# Patient Record
Sex: Female | Born: 1935
Health system: Southern US, Community
[De-identification: ages and names within clinical notes are randomized; demographics above are authoritative.]

## PROBLEM LIST (undated history)

## (undated) DIAGNOSIS — K219 Gastro-esophageal reflux disease without esophagitis: Secondary | ICD-10-CM

## (undated) DIAGNOSIS — C50919 Malignant neoplasm of unspecified site of unspecified female breast: Secondary | ICD-10-CM

## (undated) DIAGNOSIS — I499 Cardiac arrhythmia, unspecified: Secondary | ICD-10-CM

## (undated) DIAGNOSIS — I639 Cerebral infarction, unspecified: Secondary | ICD-10-CM

## (undated) DIAGNOSIS — I498 Other specified cardiac arrhythmias: Secondary | ICD-10-CM

## (undated) DIAGNOSIS — R55 Syncope and collapse: Secondary | ICD-10-CM

## (undated) DIAGNOSIS — C50511 Malignant neoplasm of lower-outer quadrant of right female breast: Principal | ICD-10-CM

## (undated) DIAGNOSIS — I10 Essential (primary) hypertension: Secondary | ICD-10-CM

## (undated) DIAGNOSIS — Z923 Personal history of irradiation: Secondary | ICD-10-CM

## (undated) DIAGNOSIS — E039 Hypothyroidism, unspecified: Secondary | ICD-10-CM

## (undated) HISTORY — DX: Malignant neoplasm of lower-outer quadrant of right female breast: C50.511

## (undated) HISTORY — PX: BREAST LUMPECTOMY: SHX2

## (undated) HISTORY — PX: OTHER SURGICAL HISTORY: SHX169

## (undated) HISTORY — PX: ABDOMINAL HYSTERECTOMY: SHX81

## (undated) HISTORY — DX: Malignant neoplasm of unspecified site of unspecified female breast: C50.919

## (undated) HISTORY — PX: CATARACT EXTRACTION: SUR2

## (undated) HISTORY — DX: Syncope and collapse: R55

## (undated) HISTORY — DX: Other specified cardiac arrhythmias: I49.8

## (undated) HISTORY — PX: INCONTINENCE SURGERY: SHX676

## (undated) HISTORY — PX: BLADDER SUSPENSION: SHX72

## (undated) HISTORY — DX: Cardiac arrhythmia, unspecified: I49.9

---

## 1998-02-23 ENCOUNTER — Other Ambulatory Visit: Admission: RE | Admit: 1998-02-23 | Discharge: 1998-02-23 | Payer: Self-pay | Admitting: Obstetrics & Gynecology

## 1999-02-27 ENCOUNTER — Other Ambulatory Visit: Admission: RE | Admit: 1999-02-27 | Discharge: 1999-02-27 | Payer: Self-pay | Admitting: Obstetrics & Gynecology

## 2000-04-21 ENCOUNTER — Other Ambulatory Visit: Admission: RE | Admit: 2000-04-21 | Discharge: 2000-04-21 | Payer: Self-pay | Admitting: Obstetrics & Gynecology

## 2001-03-29 ENCOUNTER — Encounter: Payer: Self-pay | Admitting: Internal Medicine

## 2001-03-29 ENCOUNTER — Encounter: Admission: RE | Admit: 2001-03-29 | Discharge: 2001-03-29 | Payer: Self-pay | Admitting: Internal Medicine

## 2002-06-17 ENCOUNTER — Other Ambulatory Visit: Admission: RE | Admit: 2002-06-17 | Discharge: 2002-06-17 | Payer: Self-pay | Admitting: Obstetrics & Gynecology

## 2003-07-18 ENCOUNTER — Other Ambulatory Visit: Admission: RE | Admit: 2003-07-18 | Discharge: 2003-07-18 | Payer: Self-pay | Admitting: Obstetrics & Gynecology

## 2005-12-10 ENCOUNTER — Other Ambulatory Visit: Admission: RE | Admit: 2005-12-10 | Discharge: 2005-12-10 | Payer: Self-pay | Admitting: Obstetrics & Gynecology

## 2006-07-10 ENCOUNTER — Encounter: Admission: RE | Admit: 2006-07-10 | Discharge: 2006-07-10 | Payer: Self-pay | Admitting: Internal Medicine

## 2006-07-29 ENCOUNTER — Ambulatory Visit (HOSPITAL_COMMUNITY): Admission: RE | Admit: 2006-07-29 | Discharge: 2006-07-29 | Payer: Self-pay | Admitting: Obstetrics & Gynecology

## 2006-07-29 ENCOUNTER — Encounter (INDEPENDENT_AMBULATORY_CARE_PROVIDER_SITE_OTHER): Payer: Self-pay | Admitting: Specialist

## 2007-04-02 ENCOUNTER — Ambulatory Visit: Payer: Self-pay | Admitting: Sports Medicine

## 2007-04-02 DIAGNOSIS — M79609 Pain in unspecified limb: Secondary | ICD-10-CM

## 2007-05-07 ENCOUNTER — Ambulatory Visit: Payer: Self-pay | Admitting: Sports Medicine

## 2008-05-01 ENCOUNTER — Ambulatory Visit (HOSPITAL_BASED_OUTPATIENT_CLINIC_OR_DEPARTMENT_OTHER): Admission: RE | Admit: 2008-05-01 | Discharge: 2008-05-02 | Payer: Self-pay | Admitting: Urology

## 2011-01-07 ENCOUNTER — Other Ambulatory Visit: Payer: Self-pay | Admitting: Dermatology

## 2011-03-11 NOTE — Op Note (Signed)
NAME:  Courtney, Shea NO.:  [id number]   MEDICAL RECORD NO.:  [id number]          PATIENT TYPE:  AMB   LOCATION:  [facility]                         FACILITY:  [facility]   PHYSICIAN:  Valetta Fuller, M.D.  DATE OF BIRTH:  [birth year]   DATE OF PROCEDURE:  05/01/2008  DATE OF DISCHARGE:                               OPERATIVE REPORT   PREOPERATIVE DIAGNOSIS:  Stress urinary incontinence.   POSTOPERATIVE DIAGNOSIS:  Stress urinary incontinence.   PROCEDURE PERFORMED:  Lynx suburethral sling via retropubic approach.   SURGEON:  Valetta Fuller, M.D.   ASSISTANT:  Maudie Flakes.   ANESTHESIA:  General.   INDICATIONS:  Ms. Courtney Shea is a 77 year old female who came to see me  with longstanding and progressive stress incontinence.  She had little  in the way of other voiding dysfunction.  Video urodynamics showed good  functional bladder capacity, with no instability.  She had a low  Valsalva leak point pressure of approximately 60 cmH2O pressure.  We  discussed several options with her, and she elected to have surgical  correction with suburethral sling.  The patient appeared to understand  the advantages and disadvantages, potential complications, and issues  with regard to surgery of this type.  She presents now for the  procedure.   TECHNIQUE AND FINDINGS:  The patient was brought to the operating room.  She had successful induction of general anesthesia and was placed in a  moderate lithotomy position.  She was then prepped and draped in the  usual manner.  A weighted vaginal speculum was utilized, and a Foley  catheter was placed and her bladder completely emptied.  Some Marcaine  was used to infiltrate the vaginal mucosa overlying the midurethra.  A  small incision was then made over the midurethra.  Vaginal planes were  established until were able to palpate the retropubic space bilaterally.  Two small focal incisions were made above the pubic symphysis just  lateral of the midline.  Utilizing direct digital finger control, the  [place] needles were then passed on the right and left side.  Foley  catheter was then removed, and flexible cystoscopy was performed.  The  left needle was seen just entering at the corner of the bladder  laterally.  For that reason, that needle was removed, the bladder was  again decompressed, and it was passed a second time slightly more  laterally.  Repeat cystoscopy then showed the needle to be in excellent  position, with no evidence of any bladder perforation.  The sling was  then brought out of the retropubic incisions and positioned at the  midurethra.  A right-angle clamp was utilized to prevent overtensioning  of the sling.  The protective sleeve was then removed, and copious  irrigation was performed.  Redundant sling was cut at the level of the  pubis bilaterally.  A small little piece of Surgicel was used up in the  left quadrant for what appeared to be some mild venous bleeding that was  inaccessible to the cautery.  After placement of that, the incision was  observed for approximately 5  minutes, and no additional bleeding was  noted.  The vaginal incision was closed with a running 2-0 Vicryl  suture.  Foley  catheter was reinserted, and the bladder was decompressed completely.  Vaginal packing was utilized using Estrace vaginal cream along with some  bacitracin.  The patient appeared to tolerate the procedure well.  There  were no obvious complications or problems.  She was brought to the  recovery room in stable condition.           ______________________________  Valetta Fuller, M.D.  Electronically Signed     DSG/MEDQ  D:  05/01/2008  T:  05/01/2008  Job:  [id number]

## 2011-03-14 NOTE — Op Note (Signed)
NAME:  Courtney Shea, Courtney Shea NO.:  [id number]   MEDICAL RECORD NO.:  [id number]          PATIENT TYPE:  AMB   LOCATION:  [facility]                           FACILITY:  [facility]   PHYSICIAN:  Freddy Finner, M.D.   DATE OF BIRTH:  03-01-1930   DATE OF PROCEDURE:  DATE OF DISCHARGE:                                 OPERATIVE REPORT   PREOPERATIVE DIAGNOSIS:  Complex right ovarian mass.   POSTOPERATIVE DIAGNOSIS:  Benign ovarian cyst of the right ovary.   FINAL PATHOLOGY:  Pending.   OPERATIVE PROCEDURE:  Bilateral salpingo-oophorectomy.   SURGEON:  Dr. Jennette Kettle.   ANESTHESIA:  General.   INTRAOPERATIVE COMPLICATIONS:  None.   ESTIMATED INTRAOPERATIVE BLOOD LOSS:  10 mL or less.   The patient is a 77 year old who had an incidental finding of an ovarian  mass on CT scan done with her internist, Dr. Kevan Ny, for generalized  abdominal pain.  This was evaluated in the office with ultrasound which  confirmed the presence of a complex mass.  The CT scan was otherwise  unremarkable except for diverticulosis.  A CA125 was normal.  Given all of  these findings, it was felt that the mass most likely is benign and has  elected to proceed with laparoscopic bilateral salpingo-oophorectomy.  The  patient was admitted on the morning of surgery.  She was brought to the  Operating Room and placed under adequate general anesthesia, placed in the  dorsal lithotomy position using the [facility] stirrup system.  Abdomen was  prepped in the usual fashion.  Bladder was evacuated with a Robinson  catheter with sterile technique.  Sponge forceps with a sponge in the tip  was placed into the vagina for potential use during the procedure for  exposure.  Sterile drapes were applied.  A small incision was noted at the  umbilicus and through it an 11 mm disposable trocar was introduced while  elevating the anterior abdominal wall manually.  Direct inspection revealed  adequate placement with no evidence of injury  on entry.  The  pneumoperitoneum was allowed accumulate with carbon dioxide gas.  A small  incision was made approximately 2-3 cm above the symphysis in the midline  and through it a 5 mm trocar was initially introduced under direct  visualization.  A blunt probe was used for traction and exposure.  Examination of the abdomen and pelvis was carried out.  There was no  evidence of a peritoneal disease or omental disease.  The liver appeared to  be normal.  The appendix could not be visualized.  Both tubes and ovaries  were carefully visualized and had an identical appearance, although the  right was a little bit larger.  There was no obvious excrescences or other  indications of malignancy based on visual inspection.  Using the __________  device, infundibulum pelvic ligament on the right was progressively sealed  and divided to completely free the right tube and ovary.  The left was  treated similarly in a virtually identical fashion.  Hemostasis at this  point was complete.  The 5 mm trocar was removed,  and a 11 mm trocar  introduced through the lower incision which was somewhat enlarged.  The  fascia was dilated with a Kelly clamp.  Endobag was then placed into the  abdominal cavity through the lower trocar, and the ovary captured and  removed.  The left was treated identically.  The right ovary was sent for  frozen section and initial examination by Dr. Luisa Hart confirmed benign  change.  Please note that peritoneal washings were taken on entry into the  abdomen.  With complete hemostasis confirmed under reduced intraabdominal  pressure, the gas was allowed to escape from the abdomen.  The instruments  were then removed.  The umbilical incision was closed with interrupted  subcuticular sutures of 3-0 Dexon.  The lower incision was closed in 2  layers because of the larger fascial opening and 0 Vicryl suture was used to  close the fascia.  Skin incision was closed with interrupted  subcuticular  sutures of 3-0 Dexon and quarter-inch Steri-Strips.  The patient was given  Toradol 30 mg IV on completion of the procedure.  She was taken to the  Recovery Room in good condition.  She will be discharged postoperatively  with routine outpatient surgical instructions.  She has Darvocet to be taken  as needed for postoperative pain.  She is to return to the office in  approximately 2 weeks for postoperative followup.      Freddy Finner, M.D.  Electronically Signed     WRN/MEDQ  D:  07/29/2006  T:  07/29/2006  Job:  [id number]   cc:   Candyce Churn, M.D.  Fax: [phone]

## 2011-03-14 NOTE — H&P (Signed)
NAME:  Courtney Shea, Courtney Shea NO.:  [id number]   MEDICAL RECORD NO.:  [id number]          PATIENT TYPE:  AMB   LOCATION:  [facility]                           FACILITY:  [facility]   PHYSICIAN:  Freddy Finner, M.D.   DATE OF BIRTH:  [birth year]   DATE OF ADMISSION:  DATE OF DISCHARGE:                                HISTORY & PHYSICAL   ADMISSION DIAGNOSIS:  Complex right adnexal mass consistent with complex  ovarian mass.   The patient is a 77 year old white married female, gravida 2, para 2, who  had a recent CT scan for generalized abdominal pain with her internist, Dr.  Kevan Ny.  Findings shows diverticulosis with no evidence of diverticulitis  associated and showed a 3 x 2 cm right adnexal lesion consistent with an  ovarian mass.  Dr. Kevan Ny kindly referred her to me for further management of  this problem.  He did obtain a CA125 in his office which was normal.  In my  office after initial examination, a pelvic ultrasound was obtained.  Left  ovary was felt to be normal for age.  The right ovary was 2.5 x 1.5 x 1.8 cm  and was complex.  There was some difficulty in visualizing ovarian tissue.  Minimal vascularity was seen in the mass.  There was no free fluid in the  cul-de-sac.  Given this and the essentially normal CT findings except as  noted above and some benign cysts in the liver and kidney, it is felt that  this is more than likely a benign process and for that reason the initial  attempt will be to approach this laparoscopically and perform a bilateral  salpingo-oophorectomy.  The possibility of laparotomy and the presence of  malignant disease has been discussed with the patient and she is prepared to  proceed with these procedures.  She is admitted at this time for that  purpose.   REVIEW OF SYSTEMS:  Essentially otherwise negative except as noted in  present illness.   PAST MEDICAL HISTORY:  The patient is known to have hypothyroidism and  hypertension for which she is  followed by Dr. Kevan Ny.  She is currently on 75  mcg of Synthroid, Lotrel 5/10 for hypertension.  She is on Premarin 0.45 mg  a day and baby aspirin daily.  She has no other known significant medical  illnesses other than esophageal reflux.  She has had a past history of  intermittent anemia.  She has no known allergies to medications.  Essentially all of her medications are noted above.  She is not a cigarette  smoker nor does she consume alcohol.   FAMILY HISTORY:  Mother and father both died of strokes.  No other  significant family history is noted.   PHYSICAL EXAMINATION:  HEENT:  Grossly within normal limits.  VITAL SIGNS:  Blood pressure in the office was 142/70, weight 149.2.  NECK:  Thyroid gland is not palpably enlarged to my examination.  LUNGS:  Clear to auscultation throughout.  HEART:  Normal sinus rhythm without murmurs, rubs, or gallops.  BREASTS:  Normal with no palpable  masses, no nipple discharge, no skin  change (most recent mammogram was performed in February of 2007 and was  considered to be normal).  ABDOMEN:  Soft and nontender.  There is no appreciable organomegaly or  palpable masses.  There is no CVA tenderness.  PELVIC:  External genitalia, vagina, and cervix are normal to inspection.  There is mild vaginal atrophy.  There is a first degree cystocele and first  degree rectocele.  Bimanual reveals no palpable adnexal masses and  rectovaginal examination confirms these findings.  There is no palpable  rectal abnormality.  EXTREMITIES:  Without cyanosis, clubbing, or edema.   ASSESSMENT:  Recently diagnosed incidental finding of complex right adnexal  mass consistent with an ovarian neoplasm probably benign.  Medical history  of hypothyroidism and hypertension, well managed on medications at the  direction of Dr. Kevan Ny.   PLAN:  Laparoscopy, probably bilateral salpingo-oophorectomy by this  procedure to be followed with laparotomy as needed.      Freddy Finner, M.D.  Electronically Signed     WRN/MEDQ  D:  07/28/2006  T:  07/28/2006  Job:  [id number]   cc:   Candyce Churn, M.D.  Fax: [phone]

## 2011-07-24 LAB — POCT I-STAT 4, (NA,K, GLUC, HGB,HCT)
Glucose, Bld: 97
Potassium: 4

## 2011-11-04 DIAGNOSIS — R269 Unspecified abnormalities of gait and mobility: Secondary | ICD-10-CM | POA: Diagnosis not present

## 2011-11-04 DIAGNOSIS — Z79899 Other long term (current) drug therapy: Secondary | ICD-10-CM | POA: Diagnosis not present

## 2011-11-04 DIAGNOSIS — I10 Essential (primary) hypertension: Secondary | ICD-10-CM | POA: Diagnosis not present

## 2011-11-04 DIAGNOSIS — E559 Vitamin D deficiency, unspecified: Secondary | ICD-10-CM | POA: Diagnosis not present

## 2011-11-04 DIAGNOSIS — R609 Edema, unspecified: Secondary | ICD-10-CM | POA: Diagnosis not present

## 2011-11-04 DIAGNOSIS — L408 Other psoriasis: Secondary | ICD-10-CM | POA: Diagnosis not present

## 2011-11-04 DIAGNOSIS — E039 Hypothyroidism, unspecified: Secondary | ICD-10-CM | POA: Diagnosis not present

## 2011-12-04 ENCOUNTER — Inpatient Hospital Stay (HOSPITAL_COMMUNITY)
Admission: EM | Admit: 2011-12-04 | Discharge: 2011-12-05 | DRG: 690 | Disposition: A | Discharge status: Home / Self Care | Payer: Medicare Other | Attending: Internal Medicine | Admitting: Internal Medicine

## 2011-12-04 ENCOUNTER — Encounter (HOSPITAL_COMMUNITY): Payer: Self-pay | Admitting: Cardiology

## 2011-12-04 ENCOUNTER — Emergency Department (HOSPITAL_COMMUNITY): Payer: Medicare Other

## 2011-12-04 ENCOUNTER — Other Ambulatory Visit: Payer: Self-pay

## 2011-12-04 DIAGNOSIS — N390 Urinary tract infection, site not specified: Principal | ICD-10-CM | POA: Diagnosis present

## 2011-12-04 DIAGNOSIS — E876 Hypokalemia: Secondary | ICD-10-CM | POA: Diagnosis present

## 2011-12-04 DIAGNOSIS — I498 Other specified cardiac arrhythmias: Secondary | ICD-10-CM | POA: Diagnosis not present

## 2011-12-04 DIAGNOSIS — D72829 Elevated white blood cell count, unspecified: Secondary | ICD-10-CM | POA: Diagnosis present

## 2011-12-04 DIAGNOSIS — E119 Type 2 diabetes mellitus without complications: Secondary | ICD-10-CM | POA: Diagnosis not present

## 2011-12-04 DIAGNOSIS — R55 Syncope and collapse: Secondary | ICD-10-CM | POA: Diagnosis present

## 2011-12-04 DIAGNOSIS — E039 Hypothyroidism, unspecified: Secondary | ICD-10-CM | POA: Diagnosis present

## 2011-12-04 DIAGNOSIS — I10 Essential (primary) hypertension: Secondary | ICD-10-CM | POA: Diagnosis not present

## 2011-12-04 DIAGNOSIS — Z7982 Long term (current) use of aspirin: Secondary | ICD-10-CM

## 2011-12-04 DIAGNOSIS — I471 Supraventricular tachycardia: Secondary | ICD-10-CM | POA: Diagnosis not present

## 2011-12-04 DIAGNOSIS — R4182 Altered mental status, unspecified: Secondary | ICD-10-CM | POA: Diagnosis not present

## 2011-12-04 DIAGNOSIS — S0190XA Unspecified open wound of unspecified part of head, initial encounter: Secondary | ICD-10-CM | POA: Diagnosis present

## 2011-12-04 DIAGNOSIS — I369 Nonrheumatic tricuspid valve disorder, unspecified: Secondary | ICD-10-CM

## 2011-12-04 DIAGNOSIS — R404 Transient alteration of awareness: Secondary | ICD-10-CM | POA: Diagnosis not present

## 2011-12-04 DIAGNOSIS — Z9181 History of falling: Secondary | ICD-10-CM | POA: Diagnosis not present

## 2011-12-04 DIAGNOSIS — E032 Hypothyroidism due to medicaments and other exogenous substances: Secondary | ICD-10-CM | POA: Diagnosis not present

## 2011-12-04 DIAGNOSIS — S0100XA Unspecified open wound of scalp, initial encounter: Secondary | ICD-10-CM | POA: Diagnosis not present

## 2011-12-04 DIAGNOSIS — W1809XA Striking against other object with subsequent fall, initial encounter: Secondary | ICD-10-CM | POA: Diagnosis present

## 2011-12-04 DIAGNOSIS — IMO0002 Reserved for concepts with insufficient information to code with codable children: Secondary | ICD-10-CM

## 2011-12-04 HISTORY — DX: Gastro-esophageal reflux disease without esophagitis: K21.9

## 2011-12-04 HISTORY — DX: Hypothyroidism, unspecified: E03.9

## 2011-12-04 HISTORY — DX: Essential (primary) hypertension: I10

## 2011-12-04 LAB — CARDIAC PANEL(CRET KIN+CKTOT+MB+TROPI)
CK, MB: 5.7 ng/mL — ABNORMAL HIGH (ref 0.3–4.0)
Relative Index: 0.8 (ref 0.0–2.5)
Troponin I: <0.3 ng/mL (ref <0.30)

## 2011-12-04 LAB — COMPREHENSIVE METABOLIC PANEL
ALT: 19 U/L (ref 0–35)
AST: 21 U/L (ref 0–37)
Albumin: 3.8 g/dL (ref 3.5–5.2)
BUN: 18 mg/dL (ref 6–23)
CO2: 26 mEq/L (ref 19–32)
CO2: 24 mEq/L (ref 19–32)
Calcium: 9.6 mg/dL (ref 8.4–10.5)
Calcium: 9.1 mg/dL (ref 8.4–10.5)
Creatinine, Ser: 0.66 mg/dL (ref 0.50–1.10)
GFR calc Af Amer: >90 mL/min (ref >90)
GFR calc non Af Amer: 82 mL/min — ABNORMAL LOW (ref >90)
Glucose, Bld: 143 mg/dL — ABNORMAL HIGH (ref 70–99)
Sodium: 138 mEq/L (ref 135–145)
Sodium: 136 mEq/L (ref 135–145)
Total Protein: 7.4 g/dL (ref 6.0–8.3)
Total Protein: 6.8 g/dL (ref 6.0–8.3)

## 2011-12-04 LAB — URINALYSIS, ROUTINE W REFLEX MICROSCOPIC
Glucose, UA: NEGATIVE mg/dL
Ketones, ur: NEGATIVE mg/dL
Leukocytes, UA: NEGATIVE
Nitrite: NEGATIVE
Protein, ur: NEGATIVE mg/dL
Urobilinogen, UA: 0.2 mg/dL (ref 0.0–1.0)

## 2011-12-04 LAB — DIFFERENTIAL
Basophils Absolute: 0 10*3/uL (ref 0.0–0.1)
Basophils Relative: 0 % (ref 0–1)
Eosinophils Absolute: 0 10*3/uL (ref 0.0–0.7)
Eosinophils Relative: 0 % (ref 0–5)
Lymphocytes Relative: 2 % — ABNORMAL LOW (ref 12–46)
Lymphocytes Relative: 4 % — ABNORMAL LOW (ref 12–46)
Lymphs Abs: 0.7 10*3/uL (ref 0.7–4.0)
Monocytes Relative: 5 % (ref 3–12)
Neutrophils Relative %: 91 % — ABNORMAL HIGH (ref 43–77)

## 2011-12-04 LAB — CBC
MCHC: 33.9 g/dL (ref 30.0–36.0)
MCV: 91.5 fL (ref 78.0–100.0)
MCV: 91.1 fL (ref 78.0–100.0)
Platelets: 196 10*3/uL (ref 150–400)
Platelets: 196 10*3/uL (ref 150–400)
RBC: 4.27 MIL/uL (ref 3.87–5.11)
RDW: 12.5 % (ref 11.5–15.5)
RDW: 12.6 % (ref 11.5–15.5)
WBC: 15.8 10*3/uL — ABNORMAL HIGH (ref 4.0–10.5)
WBC: 17.1 10*3/uL — ABNORMAL HIGH (ref 4.0–10.5)

## 2011-12-04 LAB — APTT: aPTT: 28 seconds (ref 24–37)

## 2011-12-04 LAB — MAGNESIUM: Magnesium: 1.6 mg/dL (ref 1.5–2.5)

## 2011-12-04 LAB — PROTIME-INR: Prothrombin Time: 14.3 seconds (ref 11.6–15.2)

## 2011-12-04 LAB — URINE MICROSCOPIC-ADD ON

## 2011-12-04 LAB — POCT I-STAT TROPONIN I

## 2011-12-04 LAB — PHOSPHORUS: Phosphorus: 2.8 mg/dL (ref 2.3–4.6)

## 2011-12-04 LAB — HEMOGLOBIN A1C
Hgb A1c MFr Bld: 5.6 % (ref <5.7)
Mean Plasma Glucose: 114 mg/dL (ref <117)

## 2011-12-04 LAB — TSH: TSH: 2.642 u[IU]/mL (ref 0.350–4.500)

## 2011-12-04 MED ORDER — FUROSEMIDE 20 MG PO TABS
20.0000 mg | ORAL_TABLET | Freq: Every day | ORAL | Status: DC
  Administered 2011-12-04: 20 mg via ORAL
  Filled 2011-12-04 (×2): qty 1

## 2011-12-04 MED ORDER — SODIUM CHLORIDE 0.9 % IJ SOLN
3.0000 mL | Freq: Two times a day (BID) | INTRAMUSCULAR | Status: DC
  Administered 2011-12-04: 3 mL via INTRAVENOUS

## 2011-12-04 MED ORDER — LOSARTAN POTASSIUM 50 MG PO TABS
100.0000 mg | ORAL_TABLET | Freq: Every day | ORAL | Status: DC
  Filled 2011-12-04 (×2): qty 2

## 2011-12-04 MED ORDER — CIPROFLOXACIN IN D5W 400 MG/200ML IV SOLN
400.0000 mg | INTRAVENOUS | Status: DC
  Administered 2011-12-04: 400 mg via INTRAVENOUS
  Filled 2011-12-04: qty 200

## 2011-12-04 MED ORDER — ONDANSETRON HCL 4 MG/2ML IJ SOLN: 4.0000 mg | Freq: Four times a day (QID) | INTRAMUSCULAR | Status: DC | PRN

## 2011-12-04 MED ORDER — ESTROGENS CONJUGATED 0.45 MG PO TABS
0.4500 mg | ORAL_TABLET | Freq: Every day | ORAL | Status: DC
  Filled 2011-12-04: qty 1

## 2011-12-04 MED ORDER — LEVOTHYROXINE SODIUM 88 MCG PO TABS
88.0000 ug | ORAL_TABLET | Freq: Every day | ORAL | Status: DC
  Filled 2011-12-04 (×2): qty 1

## 2011-12-04 MED ORDER — SENNOSIDES-DOCUSATE SODIUM 8.6-50 MG PO TABS
1.0000 | ORAL_TABLET | Freq: Every day | ORAL | Status: DC | PRN
Start: 2011-12-04 — End: 2011-12-05
  Filled 2011-12-04: qty 1

## 2011-12-04 MED ORDER — ONDANSETRON HCL 4 MG PO TABS: 4.0000 mg | ORAL_TABLET | Freq: Four times a day (QID) | ORAL | Status: DC | PRN

## 2011-12-04 MED ORDER — ACETAMINOPHEN 325 MG PO TABS
650.0000 mg | ORAL_TABLET | Freq: Four times a day (QID) | ORAL | Status: DC | PRN
  Administered 2011-12-04: 650 mg via ORAL
  Filled 2011-12-04: qty 2

## 2011-12-04 MED ORDER — ACETAMINOPHEN 500 MG PO TABS
1000.0000 mg | ORAL_TABLET | ORAL | Status: AC
  Administered 2011-12-04: 1000 mg via ORAL
  Filled 2011-12-04: qty 2

## 2011-12-04 MED ORDER — SODIUM CHLORIDE 0.9 % IV SOLN
INTRAVENOUS | Status: DC
  Administered 2011-12-04: 16:00:00 via INTRAVENOUS

## 2011-12-04 MED ORDER — POTASSIUM CHLORIDE CRYS ER 20 MEQ PO TBCR
20.0000 meq | EXTENDED_RELEASE_TABLET | Freq: Once | ORAL | Status: AC
  Administered 2011-12-04: 20 meq via ORAL
  Filled 2011-12-04: qty 1

## 2011-12-04 MED ORDER — DEXTROSE 5 % IV SOLN
1.0000 g | INTRAVENOUS | Status: DC
  Administered 2011-12-04: 1 g via INTRAVENOUS
  Filled 2011-12-04 (×2): qty 10

## 2011-12-04 NOTE — Progress Notes (Signed)
Pt admitted and receiving IV Cipro from ED. Pt arm red and itching where IV infusing. Infusion stopped, MD notified. Will continue to monitor site. Courtney Shea

## 2011-12-04 NOTE — ED Notes (Signed)
MD at bedside. 

## 2011-12-04 NOTE — Progress Notes (Signed)
  Echocardiogram 2D Echocardiogram has been performed.  Courtney Shea 12/04/2011, 3:39 PM

## 2011-12-04 NOTE — H&P (Signed)
PCP:  No primary provider on file.   DOA:  12/04/2011  9:25 AM  Chief Complaint:  syncope  HPI:  78 year old female with history of HTN, hypothyroidism, on estrogen supplement who presented to ED after a syncopal episode on the day of admission. Patient reported she found herself on the floor but is unsure of how long she was on the floor and could not recall the events prior to loss of consciousness. Patient denied prodromal symptoms of chest pain, palpitations, shortness of breath. No lightheadedness or dizziness prior to syncope. Patient has never experienced similar symptoms in past. Patient reports having dysuria about few days prior to admission. No associated hematuria, abdominal pain, nausea or vomiting. No complaints of fever, chills.   Assessment/Plan  Principal Problem:   *Syncope - unclear etiology of syncopal episode/ perhaps secondary to cardiac arrythmia versus an infectious process such as UTI - CT head shows no acute intracranial findings - unsure why patient is on estrogen supplement so I will hold it for today and perhaps it can be evaluated if she truly needs this supplement - cycle cardiac enzymes, TSH, fasting lipid panel, A1c - follow up 2 D ECHO  Active Problems:  UTI (lower urinary tract infection) - patient had an allergic reaction to cipro (broke out / rash) so we will start ceftriaxone - follow up urine culture  Hypothyroidism - continue levothyroxine - check TSH  HTN (hypertension) - BP 138/63; at goal - continue home medications   Hypokalemia - repleted - follow up BMP in am  Disposition - admit to telemetry - PCP notified of patient's admission   Allergies: No Known Allergies  Prior to Admission medications   Medication Sig Start Date End Date Taking? Authorizing Provider  aspirin 81 MG tablet Take 81 mg by mouth daily.   Yes Historical Provider, MD  estrogens, conjugated, (PREMARIN) 0.45 MG tablet Take 0.45 mg by mouth daily.   Yes  Historical Provider, MD  furosemide (LASIX) 20 MG tablet Take 20 mg by mouth daily as needed. swelling   Yes Historical Provider, MD  levothyroxine (SYNTHROID, LEVOTHROID) 88 MCG tablet Take 88 mcg by mouth daily.   Yes Historical Provider, MD  losartan (COZAAR) 100 MG tablet Take 100 mg by mouth daily.   Yes Historical Provider, MD  sennosides-docusate sodium (SENOKOT-S) 8.6-50 MG tablet Take 1 tablet by mouth daily as needed.   Yes Historical Provider, MD    Past Medical History  Diagnosis Date  . Hypertension   . Hypothyroid   . Shortness of breath   . GERD (gastroesophageal reflux disease)     Past Surgical History  Procedure Date  . Abdominal hysterectomy   . Ovarian cyst removed   . Incontinence surgery     Social History:  reports that she has never smoked. She has never used smokeless tobacco. She reports that she does not drink alcohol or use illicit drugs.  Family History  Problem Relation Age of Onset  . Coronary artery disease Mother     MI at age 44    Review of Systems:  Constitutional: Denies fever, chills, diaphoresis, appetite change and fatigue.  HEENT: Denies photophobia, eye pain, redness, hearing loss, ear pain, congestion, sore throat, rhinorrhea, sneezing, mouth sores, trouble swallowing, neck pain, neck stiffness and tinnitus.   Respiratory: Denies SOB, DOE, cough, chest tightness,  and wheezing.   Cardiovascular: Denies chest pain, palpitations and leg swelling.  Gastrointestinal: Denies nausea, vomiting, abdominal pain, diarrhea, constipation, blood in stool  and abdominal distention.  Genitourinary: Denies dysuria, urgency, frequency, hematuria, flank pain and difficulty urinating.  Musculoskeletal: Denies myalgias, back pain, joint swelling, arthralgias and gait problem.  Skin: Denies pallor, rash and wound.  Neurological: Denies dizziness, seizures, weakness, light-headedness, numbness and headaches. ; positive for syncope Hematological: Denies  adenopathy. Easy bruising, personal or family bleeding history  Psychiatric/Behavioral: Denies suicidal ideation, mood changes, confusion, nervousness, sleep disturbance and agitation   Physical Exam:  Filed Vitals:   12/04/11 1134 12/04/11 1300 12/04/11 1413 12/04/11 1500  BP:  140/62 136/52 138/63  Pulse: 113 114 110 58  Temp:    98.7 F (37.1 C)  TempSrc:    Oral  Resp: 20 20 18 18   Height:      Weight:      SpO2: 95% 95% 93% 92%    Constitutional: Vital signs reviewed.  Patient is a well-developed and well-nourished in no acute distress and cooperative with exam. Alert and oriented x3.  Head: Normocephalic and atraumatic Ear: TM normal bilaterally Mouth: no erythema or exudates, MMM Eyes: PERRL, EOMI, conjunctivae normal, No scleral icterus.  Neck: Supple, Trachea midline normal ROM, No JVD, mass, thyromegaly, or carotid bruit present.  Cardiovascular: RRR, S1 normal, S2 normal, no MRG, pulses symmetric and intact bilaterally Pulmonary/Chest: CTAB, no wheezes, rales, or rhonchi Abdominal: Soft. Non-tender, non-distended, bowel sounds are normal, no masses, organomegaly, or guarding present.  GU: no CVA tenderness Musculoskeletal: No joint deformities, erythema, or stiffness, ROM full and no nontender Ext: no edema and no cyanosis, pulses palpable bilaterally (DP and PT) Hematology: no cervical, inginal, or axillary adenopathy.  Neurological: A&O x3, Strenght is normal and symmetric bilaterally, cranial nerve II-XII are grossly intact, no focal motor deficit, sensory intact to light touch bilaterally.  Skin: Warm, dry and intact. No rash, cyanosis, or clubbing.  Psychiatric: Normal mood and affect. speech and behavior is normal. Judgment and thought content normal. Cognition and memory are normal.   Labs on Admission:  Results for orders placed during the hospital encounter of 12/04/11 (from the past 48 hour(s))  CBC     Status: Abnormal   Collection Time   12/04/11 10:13 AM        Component Value Range Comment   WBC 15.8 (*) 4.0 - 10.5 (K/uL)    RBC 4.58  3.87 - 5.11 (MIL/uL)    Hemoglobin 14.2  12.0 - 15.0 (g/dL)    HCT 45.4  09.8 - 11.9 (%)    MCV 91.5  78.0 - 100.0 (fL)    MCH 31.0  26.0 - 34.0 (pg)    MCHC 33.9  30.0 - 36.0 (g/dL)    RDW 14.7  82.9 - 56.2 (%)    Platelets 196  150 - 400 (K/uL)   DIFFERENTIAL     Status: Abnormal   Collection Time   12/04/11 10:13 AM      Component Value Range Comment   Neutrophils Relative 92 (*) 43 - 77 (%)    Neutro Abs 14.6 (*) 1.7 - 7.7 (K/uL)    Lymphocytes Relative 2 (*) 12 - 46 (%)    Lymphs Abs 0.4 (*) 0.7 - 4.0 (K/uL)    Monocytes Relative 5  3 - 12 (%)    Monocytes Absolute 0.8  0.1 - 1.0 (K/uL)    Eosinophils Relative 0  0 - 5 (%)    Eosinophils Absolute 0.0  0.0 - 0.7 (K/uL)    Basophils Relative 0  0 - 1 (%)    Basophils  Absolute 0.0  0.0 - 0.1 (K/uL)   COMPREHENSIVE METABOLIC PANEL     Status: Abnormal   Collection Time   12/04/11 10:13 AM      Component Value Range Comment   Sodium 138  135 - 145 (mEq/L)    Potassium 3.4 (*) 3.5 - 5.1 (mEq/L)    Chloride 99  96 - 112 (mEq/L)    CO2 26  19 - 32 (mEq/L)    Glucose, Bld 128 (*) 70 - 99 (mg/dL)    BUN 17  6 - 23 (mg/dL)    Creatinine, Ser 6.21  0.50 - 1.10 (mg/dL)    Calcium 9.6  8.4 - 10.5 (mg/dL)    Total Protein 7.4  6.0 - 8.3 (g/dL)    Albumin 3.8  3.5 - 5.2 (g/dL)    AST 21  0 - 37 (U/L)    ALT 19  0 - 35 (U/L)    Alkaline Phosphatase 69  39 - 117 (U/L)    Total Bilirubin 0.6  0.3 - 1.2 (mg/dL)    GFR calc non Af Amer 80 (*) >90 (mL/min)    GFR calc Af Amer >90  >90 (mL/min)   POCT I-STAT TROPONIN I     Status: Normal   Collection Time   12/04/11 10:22 AM      Component Value Range Comment   Troponin i, poc 0.00  0.00 - 0.08 (ng/mL)    Comment 3            URINALYSIS, ROUTINE W REFLEX MICROSCOPIC     Status: Abnormal      Component Value Range Comment    Color, Urine YELLOW  YELLOW     APPearance CLEAR  CLEAR     Specific Gravity, Urine  1.012  1.005 - 1.030     pH 7.5  5.0 - 8.0     Glucose, UA NEGATIVE  NEGATIVE (mg/dL)    Hgb urine dipstick TRACE (*) NEGATIVE     Bilirubin Urine NEGATIVE  NEGATIVE     Ketones, ur NEGATIVE  NEGATIVE (mg/dL)    Protein, ur NEGATIVE  NEGATIVE (mg/dL)    Urobilinogen, UA 0.2  0.0 - 1.0 (mg/dL)    Nitrite NEGATIVE  NEGATIVE     Leukocytes, UA NEGATIVE  NEGATIVE    URINE MICROSCOPIC-ADD ON     Status: Abnormal   Collection Time   12/04/11  2:07 PM      Component Value Range Comment   Squamous Epithelial / LPF FEW (*) RARE     RBC / HPF 0-2  <3 (RBC/hpf)    Bacteria, UA RARE  RARE    COMPREHENSIVE METABOLIC PANEL     Status: Abnormal      Component Value Range Comment   Sodium 136  135 - 145 (mEq/L)    Potassium 3.7  3.5 - 5.1 (mEq/L)    Chloride 99  96 - 112 (mEq/L)    CO2 24  19 - 32 (mEq/L)    Glucose, Bld 143 (*) 70 - 99 (mg/dL)    BUN 18  6 - 23 (mg/dL)    Creatinine, Ser 3.08  0.50 - 1.10 (mg/dL)    Calcium 9.1  8.4 - 10.5 (mg/dL)    Total Protein 6.8  6.0 - 8.3 (g/dL)    Albumin 3.3 (*) 3.5 - 5.2 (g/dL)    AST 26  0 - 37 (U/L)    ALT 20  0 - 35 (U/L)    Alkaline Phosphatase  64  39 - 117 (U/L)    Total Bilirubin 0.6  0.3 - 1.2 (mg/dL)    GFR calc non Af Amer 82 (*) >90 (mL/min)    GFR calc Af Amer >90  >90 (mL/min)   MAGNESIUM     Status: Normal   Magnesium 1.6  1.5 - 2.5 (mg/dL)   PHOSPHORUS     Status: Normal      Component Value Range Comment   Phosphorus 2.8  2.3 - 4.6 (mg/dL)    WBC 40.9 (*) 4.0 - 10.5 (K/uL)    RBC 4.27  3.87 - 5.11 (MIL/uL)    Hemoglobin 13.3  12.0 - 15.0 (g/dL)    HCT 81.1  91.4 - 78.2 (%)    MCV 91.1  78.0 - 100.0 (fL)    MCH 31.1  26.0 - 34.0 (pg)    MCHC 34.2  30.0 - 36.0 (g/dL)    RDW 95.6  21.3 - 08.6 (%)    Platelets 196  150 - 400 (K/uL)    aPTT 28  24 - 37 (seconds)    Prothrombin Time 14.3  11.6 - 15.2 (seconds)    INR 1.09  0.00 - 1.49     Total CK PENDING  7 - 177 (U/L)    CK, MB 5.8 (*) 0.3 - 4.0 (ng/mL)    Troponin I <0.30   <0.30 (ng/mL)    Relative Index PENDING  0.0 - 2.5      Radiological Exams on Admission: No results found.  Time Spent on Admission: Greater than 30 minutes  Mylisa Brunson 12/04/2011, 4:43 PM

## 2011-12-04 NOTE — ED Notes (Signed)
Pt to department from home via EMS- pt husband found the pt on the floor and pt reports that she was unable to remember events. Pt has an inch lac to the back of her head from the fall. Pt had another episode with husband PTA. Bp-165/75 Hr-110 in [place].

## 2011-12-04 NOTE — Consults (Signed)
HPI: 78 year old female with no prior cardiac history for evaluation of question syncope. Patient occasionally has mild dyspnea on exertion but no orthopnea, PND, palpitations or history of syncope. Occasional mild pedal edema in the summer controlled with diuretics. Occasional chest pain for several years. Pain is substernal without radiation. It lasts several minutes and resolves spontaneously. Not pleuritic, positional, exertional or related to food. No associated symptoms. Patient awoke this morning and noted chills. She thought she may be developing the flu. She walked into her again and has no memory of any events from that time until being awakened by her husband. There was no preceding chest pain, nausea, shortness of breath, incontinence or seizure activity. She had trauma to her occipital area. She has no memory of the events but does state she has had problems with her balance recently. She has had falls. She is unclear whether she had syncope or nearly fell and then striking her head.    No Known Allergies  Past Medical History  Diagnosis Date  . Hypertension   . Hypothyroid     Past Surgical History  Procedure Date  . Abdominal hysterectomy   . Ovarian cyst removed   . Incontinence surgery     History   Social History  . Marital Status: Married    Spouse Name: N/A    Number of Children: N/A  . Years of Education: N/A   Occupational History  . Not on file.   Social History Main Topics  . Smoking status: Never Smoker   . Smokeless tobacco: Not on file  . Alcohol Use: No  . Drug Use: No  . Sexually Active: Not on file   Other Topics Concern  . Not on file   Social History Narrative  . No narrative on file    Family History  Problem Relation Age of Onset  . Coronary artery disease Mother     MI at age 47    ROS:  Patient with mild headache and chills this morning but no fevers, productive cough, hemoptysis, dysphasia, odynophagia, melena, hematochezia,  dysuria, hematuria, rash, seizure activity, orthopnea, PND, pedal edema, claudication. Remaining systems are negative.  Physical Exam:   Blood pressure 140/62, pulse 114, temperature 99.3 F (37.4 C), temperature source Oral, resp. rate 20, height 5\' 5"  (1.651 m), weight 162 lb (73.483 kg), SpO2 95.00%.  General:  Well developed/well nourished in NAD Skin warm/dry Patient not depressed No peripheral clubbing Back-normal HEENT-small laceration in the central area, normal eyelids Neck supple/normal carotid upstroke bilaterally; no bruits; no JVD; no thyromegaly chest - CTA/ normal expansion CV - RRR/normal S1 and S2; no murmurs, rubs;  PMI nondisplaced; S4 noted. Abdomen -NT/ND, no HSM, no mass, + bowel sounds, no bruit 2+ femoral pulses, no bruits Ext-no edema, chords, 2+ DP Neuro-grossly nonfocal  ECG sinus rhythm with ventricular bigeminy, nonspecific ST changes.  Results for orders placed during the hospital encounter of 12/04/11 (from the past 48 hour(s))  CBC     Status: Abnormal   Collection Time   12/04/11 10:13 AM      Component Value Range Comment   WBC 15.8 (*) 4.0 - 10.5 (K/uL)    RBC 4.58  3.87 - 5.11 (MIL/uL)    Hemoglobin 14.2  12.0 - 15.0 (g/dL)    HCT 16.1  09.6 - 04.5 (%)    MCV 91.5  78.0 - 100.0 (fL)    MCH 31.0  26.0 - 34.0 (pg)    MCHC 33.9  30.0 -  36.0 (g/dL)    RDW 16.1  09.6 - 04.5 (%)    Platelets 196  150 - 400 (K/uL)   DIFFERENTIAL     Status: Abnormal   Collection Time   12/04/11 10:13 AM      Component Value Range Comment   Neutrophils Relative 92 (*) 43 - 77 (%)    Neutro Abs 14.6 (*) 1.7 - 7.7 (K/uL)    Lymphocytes Relative 2 (*) 12 - 46 (%)    Lymphs Abs 0.4 (*) 0.7 - 4.0 (K/uL)    Monocytes Relative 5  3 - 12 (%)    Monocytes Absolute 0.8  0.1 - 1.0 (K/uL)    Eosinophils Relative 0  0 - 5 (%)    Eosinophils Absolute 0.0  0.0 - 0.7 (K/uL)    Basophils Relative 0  0 - 1 (%)    Basophils Absolute 0.0  0.0 - 0.1 (K/uL)   COMPREHENSIVE  METABOLIC PANEL     Status: Abnormal   Collection Time   12/04/11 10:13 AM      Component Value Range Comment   Sodium 138  135 - 145 (mEq/L)    Potassium 3.4 (*) 3.5 - 5.1 (mEq/L)    Chloride 99  96 - 112 (mEq/L)    CO2 26  19 - 32 (mEq/L)    Glucose, Bld 128 (*) 70 - 99 (mg/dL)    BUN 17  6 - 23 (mg/dL)    Creatinine, Ser 4.09  0.50 - 1.10 (mg/dL)    Calcium 9.6  8.4 - 10.5 (mg/dL)    Total Protein 7.4  6.0 - 8.3 (g/dL)    Albumin 3.8  3.5 - 5.2 (g/dL)    AST 21  0 - 37 (U/L)    ALT 19  0 - 35 (U/L)    Alkaline Phosphatase 69  39 - 117 (U/L)    Total Bilirubin 0.6  0.3 - 1.2 (mg/dL)    GFR calc non Af Amer 80 (*) >90 (mL/min)    GFR calc Af Amer >90  >90 (mL/min)   POCT I-STAT TROPONIN I     Status: Normal   Collection Time   12/04/11 10:22 AM      Component Value Range Comment   Troponin i, poc 0.00  0.00 - 0.08 (ng/mL)    Comment 3              Ct Head Wo Contrast  12/04/2011  *RADIOLOGY REPORT*  Clinical Data:  Found down with altered mental status.  Posterior head laceration.  CT HEAD WITHOUT CONTRAST CT CERVICAL SPINE WITHOUT CONTRAST  Technique:  Multidetector CT imaging of the head and cervical spine was performed following the standard protocol without intravenous contrast.  Multiplanar CT image reconstructions of the cervical spine were also generated.  Comparison:  None.  CT HEAD  Findings: There is no evidence of acute intracranial hemorrhage, mass lesion, brain edema or extra-axial fluid collection.  The ventricles and subarachnoid spaces are appropriately sized for age. There is no CT evidence of acute cortical infarction.  Mild chronic small vessel ischemic changes are present within the periventricular and subcortical white matter.  There is right sphenoid sinus opacification. 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  The additional visualized paranasal sinuses are clear. The calvarium is intact. There is some soft tissue emphysema within the scalp  at the vertex.  IMPRESSION:  1.  Scalp soft tissue injury. 2.  No acute intracranial or calvarial  findings.  CT CERVICAL SPINE  Findings: There is a convex left scoliosis.  The lateral alignment is normal.  There is no evidence of acute fracture or traumatic subluxation.  There is multilevel spondylosis with advanced disc space loss at C5-C6 and C6-C7.  There is multilevel facet disease, asymmetric to the left.  Fragmentation of the left C7 articulating facet has sclerotic margins and does not appear acute. Ossification of the ligamentum nuchae is noted.  No acute soft tissue findings are evident.  The lung apices are clear.  IMPRESSION:  1.  No evidence of acute cervical spine fracture, traumatic subluxation or static signs of instability. 2.  Moderate spondylosis.  Original Report Authenticated By: Gerrianne Scale, M.D.   Ct Cervical Spine Wo Contrast  12/04/2011  *RADIOLOGY REPORT*  Clinical Data:  Found down with altered mental status.  Posterior head laceration.  CT HEAD WITHOUT CONTRAST CT CERVICAL SPINE WITHOUT CONTRAST  Technique:  Multidetector CT imaging of the head and cervical spine was performed following the standard protocol without intravenous contrast.  Multiplanar CT image reconstructions of the cervical spine were also generated.  Comparison:  None.  CT HEAD  Findings: There is no evidence of acute intracranial hemorrhage, mass lesion, brain edema or extra-axial fluid collection.  The ventricles and subarachnoid spaces are appropriately sized for age. There is no CT evidence of acute cortical infarction.  Mild chronic small vessel ischemic changes are present within the periventricular and subcortical white matter.  There is right sphenoid sinus opacification. 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  The additional visualized paranasal sinuses are clear. The calvarium is intact. There is some soft tissue emphysema within the scalp at the vertex.  IMPRESSION:  1.   Scalp soft tissue injury. 2.  No acute intracranial or calvarial findings.  CT CERVICAL SPINE  Findings: There is a convex left scoliosis.  The lateral alignment is normal.  There is no evidence of acute fracture or traumatic subluxation.  There is multilevel spondylosis with advanced disc space loss at C5-C6 and C6-C7.  There is multilevel facet disease, asymmetric to the left.  Fragmentation of the left C7 articulating facet has sclerotic margins and does not appear acute. Ossification of the ligamentum nuchae is noted.  No acute soft tissue findings are evident.  The lung apices are clear.  IMPRESSION:  1.  No evidence of acute cervical spine fracture, traumatic subluxation or static signs of instability. 2.  Moderate spondylosis.  Original Report Authenticated By: Gerrianne Scale, M.D.    Assessment/Plan 1) syncope-question loss of consciousness although not clear. Patient has no recollection of events after she walked into her den. She was found on the floor with a head laceration. Duration of unconsciousness unknown. Patient has ventricular bigeminy on her electrocardiogram but no other rhythm disturbance noted. It is not clear to me that her episode was cardiac mediated. I would recommend admitting to telemetry for monitoring. Check echocardiogram for LV function. If LV function normal and no significant arrhythmia on telemetry then she will most likely need an outpatient monitor and Myoview. Patient has had problems with instability and falling recently. Question if she fell and hit her head causing loss of consciousness. She has no memory of events. #2-hypertension-will continue present blood pressure medications. Note she does not have orthostatic symptoms. #3-hypothyroid-continue Synthroid. We will follow while she is in the hospital.   Olga Millers MD 12/04/2011, 1:57 PM

## 2011-12-04 NOTE — ED Provider Notes (Signed)
History     CSN: [id number]  Arrival date & time 12/04/11  9147   First MD Initiated Contact with Patient 12/04/11 0932      Chief Complaint  Patient presents with  . Loss of Consciousness    (Consider location/radiation/quality/duration/timing/severity/associated sxs/prior treatment) Patient is a 78 y.o. female presenting with syncope. The history is provided by the spouse (husban states she passed out today). No language interpreter was used.  Loss of Consciousness This is a new problem. The current episode started less than 1 hour ago. Episode frequency: once. The problem has not changed since onset.Pertinent negatives include no chest pain, no abdominal pain and no headaches. The symptoms are aggravated by nothing. The symptoms are relieved by nothing. She has tried nothing for the symptoms. The treatment provided no relief.    Past Medical History  Diagnosis Date  . Hypertension   . Thyroid disease     History reviewed. No pertinent past surgical history.  History reviewed. No pertinent family history.  History  Substance Use Topics  . Smoking status: Not on file  . Smokeless tobacco: Not on file  . Alcohol Use:     OB History    Grav Para Term Preterm Abortions TAB SAB Ect Mult Living                  Review of Systems  Constitutional: Negative for fatigue.  HENT: Negative for congestion, sinus pressure and ear discharge.   Eyes: Negative for discharge.  Respiratory: Negative for cough.   Cardiovascular: Positive for syncope. Negative for chest pain.  Gastrointestinal: Negative for abdominal pain and diarrhea.  Genitourinary: Negative for frequency and hematuria.  Musculoskeletal: Negative for back pain.  Skin: Negative for rash.  Neurological: Negative for seizures and headaches.  Hematological: Negative.   Psychiatric/Behavioral: Negative for hallucinations.    Allergies  Review of patient's allergies indicates no known allergies.  Home Medications     Current Outpatient Rx  Name Route Sig Dispense Refill  . ASPIRIN 81 MG PO TABS Oral Take 81 mg by mouth daily.    Marland Kitchen ESTROGENS CONJUGATED 0.45 MG PO TABS Oral Take 0.45 mg by mouth daily.    . FUROSEMIDE 20 MG PO TABS Oral Take 20 mg by mouth daily as needed. swelling    . LEVOTHYROXINE SODIUM 88 MCG PO TABS Oral Take 88 mcg by mouth daily.    Marland Kitchen LOSARTAN POTASSIUM 100 MG PO TABS Oral Take 100 mg by mouth daily.    . SENNA-DOCUSATE SODIUM 8.6-50 MG PO TABS Oral Take 1 tablet by mouth daily as needed.      BP 140/62  Pulse 114  Temp(Src) 99.3 F (37.4 C) (Oral)  Resp 20  Ht 5\' 5"  (1.651 m)  Wt 162 lb (73.483 kg)  BMI 26.96 kg/m2  SpO2 95%  Physical Exam  Constitutional: She is oriented to person, place, and time. She appears well-developed.  HENT:  Head: Normocephalic.       4 cm lac to scalp post.  Eyes: Conjunctivae and EOM are normal. No scleral icterus.  Neck: Neck supple. No thyromegaly present.  Cardiovascular: Normal rate and regular rhythm.  Exam reveals no gallop and no friction rub.   No murmur heard. Pulmonary/Chest: No stridor. She has no wheezes. She has no rales. She exhibits no tenderness.  Abdominal: She exhibits no distension. There is no tenderness. There is no rebound.  Musculoskeletal: Normal range of motion. She exhibits no edema.  Lymphadenopathy:  She has no cervical adenopathy.  Neurological: She is oriented to person, place, and time. Coordination normal.  Skin: No rash noted. No erythema.  Psychiatric: She has a normal mood and affect. Her behavior is normal.    ED Course  LACERATION REPAIR Performed by: Ashea Winiarski L Authorized by: Deval Mroczka L Comments: History and had a 4 cm laceration to the occipital head. It was cleaned with Betadine and 5 staples were used to close laceration. The patient tolerated the procedure well   (including critical care time)  Labs Reviewed  CBC - Abnormal; Notable for the following:    WBC 15.8 (*)     All other components within normal limits  DIFFERENTIAL - Abnormal; Notable for the following:    Neutrophils Relative 92 (*)    Neutro Abs 14.6 (*)    Lymphocytes Relative 2 (*)    Lymphs Abs 0.4 (*)    All other components within normal limits  COMPREHENSIVE METABOLIC PANEL - Abnormal; Notable for the following:    Potassium 3.4 (*)    Glucose, Bld 128 (*)    GFR calc non Af Amer 80 (*)    All other components within normal limits  POCT I-STAT TROPONIN I  URINE CULTURE   Ct Head Wo Contrast  12/04/2011  *RADIOLOGY REPORT*  Clinical Data:  Found down with altered mental status.  Posterior head laceration.  CT HEAD WITHOUT CONTRAST CT CERVICAL SPINE WITHOUT CONTRAST  Technique:  Multidetector CT imaging of the head and cervical spine was performed following the standard protocol without intravenous contrast.  Multiplanar CT image reconstructions of the cervical spine were also generated.  Comparison:  None.  CT HEAD  Findings: There is no evidence of acute intracranial hemorrhage, mass lesion, brain edema or extra-axial fluid collection.  The ventricles and subarachnoid spaces are appropriately sized for age. There is no CT evidence of acute cortical infarction.  Mild chronic small vessel ischemic changes are present within the periventricular and subcortical white matter.  There is right sphenoid sinus opacification. 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  The additional visualized paranasal sinuses are clear. The calvarium is intact. There is some soft tissue emphysema within the scalp at the vertex.  IMPRESSION:  1.  Scalp soft tissue injury. 2.  No acute intracranial or calvarial findings.  CT CERVICAL SPINE  Findings: There is a convex left scoliosis.  The lateral alignment is normal.  There is no evidence of acute fracture or traumatic subluxation.  There is multilevel spondylosis with advanced disc space loss at C5-C6 and C6-C7.  There is multilevel facet disease,  asymmetric to the left.  Fragmentation of the left C7 articulating facet has sclerotic margins and does not appear acute. Ossification of the ligamentum nuchae is noted.  No acute soft tissue findings are evident.  The lung apices are clear.  IMPRESSION:  1.  No evidence of acute cervical spine fracture, traumatic subluxation or static signs of instability. 2.  Moderate spondylosis.  Original Report Authenticated By: Gerrianne Scale, M.D.   Ct Cervical Spine Wo Contrast  12/04/2011  *RADIOLOGY REPORT*  Clinical Data:  Found down with altered mental status.  Posterior head laceration.  CT HEAD WITHOUT CONTRAST CT CERVICAL SPINE WITHOUT CONTRAST  Technique:  Multidetector CT imaging of the head and cervical spine was performed following the standard protocol without intravenous contrast.  Multiplanar CT image reconstructions of the cervical spine were also generated.  Comparison:  None.  CT  HEAD  Findings: There is no evidence of acute intracranial hemorrhage, mass lesion, brain edema or extra-axial fluid collection.  The ventricles and subarachnoid spaces are appropriately sized for age. There is no CT evidence of acute cortical infarction.  Mild chronic small vessel ischemic changes are present within the periventricular and subcortical white matter.  There is right sphenoid sinus opacification. 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  The additional visualized paranasal sinuses are clear. The calvarium is intact. There is some soft tissue emphysema within the scalp at the vertex.  IMPRESSION:  1.  Scalp soft tissue injury. 2.  No acute intracranial or calvarial findings.  CT CERVICAL SPINE  Findings: There is a convex left scoliosis.  The lateral alignment is normal.  There is no evidence of acute fracture or traumatic subluxation.  There is multilevel spondylosis with advanced disc space loss at C5-C6 and C6-C7.  There is multilevel facet disease, asymmetric to the left.   Fragmentation of the left C7 articulating facet has sclerotic margins and does not appear acute. Ossification of the ligamentum nuchae is noted.  No acute soft tissue findings are evident.  The lung apices are clear.  IMPRESSION:  1.  No evidence of acute cervical spine fracture, traumatic subluxation or static signs of instability. 2.  Moderate spondylosis.  Original Report Authenticated By: Gerrianne Scale, M.D.     1. Syncope      Date: 12/04/2011  Rate:122  Rhythm: premature ventricular contractions (PVC)  bigeminy  QRS Axis: normal  Intervals: normal  ST/T Wave abnormalities: nonspecific ST changes  Conduction Disutrbances:none  Narrative Interpretation:   Old EKG Reviewed: changes noted    MDM          Benny Lennert, MD 12/04/11 1352

## 2011-12-05 DIAGNOSIS — D72829 Elevated white blood cell count, unspecified: Secondary | ICD-10-CM | POA: Diagnosis present

## 2011-12-05 DIAGNOSIS — I498 Other specified cardiac arrhythmias: Secondary | ICD-10-CM | POA: Diagnosis not present

## 2011-12-05 DIAGNOSIS — R55 Syncope and collapse: Secondary | ICD-10-CM | POA: Diagnosis not present

## 2011-12-05 DIAGNOSIS — I471 Supraventricular tachycardia: Secondary | ICD-10-CM | POA: Diagnosis not present

## 2011-12-05 DIAGNOSIS — I10 Essential (primary) hypertension: Secondary | ICD-10-CM | POA: Diagnosis not present

## 2011-12-05 DIAGNOSIS — E039 Hypothyroidism, unspecified: Secondary | ICD-10-CM | POA: Diagnosis not present

## 2011-12-05 LAB — URINE CULTURE: Colony Count: NO GROWTH

## 2011-12-05 LAB — CBC
Hemoglobin: 12 g/dL (ref 12.0–15.0)
MCHC: 33.4 g/dL (ref 30.0–36.0)
RBC: 3.91 MIL/uL (ref 3.87–5.11)
WBC: 9.3 10*3/uL (ref 4.0–10.5)

## 2011-12-05 LAB — BASIC METABOLIC PANEL
GFR calc Af Amer: >90 mL/min (ref >90)
GFR calc non Af Amer: 81 mL/min — ABNORMAL LOW (ref >90)
Potassium: 3.3 mEq/L — ABNORMAL LOW (ref 3.5–5.1)
Sodium: 139 mEq/L (ref 135–145)

## 2011-12-05 LAB — CARDIAC PANEL(CRET KIN+CKTOT+MB+TROPI)
CK, MB: 4.2 ng/mL — ABNORMAL HIGH (ref 0.3–4.0)
Relative Index: 0.8 (ref 0.0–2.5)
Total CK: 542 U/L — ABNORMAL HIGH (ref 7–177)
Troponin I: <0.3 ng/mL (ref <0.30)

## 2011-12-05 MED ORDER — METOPROLOL SUCCINATE ER 50 MG PO TB24
50.0000 mg | ORAL_TABLET | Freq: Every day | ORAL | Status: DC
  Filled 2011-12-05: qty 1

## 2011-12-05 MED ORDER — METOPROLOL SUCCINATE ER 50 MG PO TB24: 50.0000 mg | ORAL_TABLET | Freq: Every day | ORAL | Status: DC

## 2011-12-05 NOTE — Progress Notes (Signed)
Pt discharged to home per MD order. Pt received all discharge instructions and medication information, including follow up appointment information and prescription.  Efraim Kaufmann

## 2011-12-05 NOTE — Progress Notes (Signed)
@   Subjective:  Denies CP or dyspnea; no dizziness; Headache from laceration   Objective:  Filed Vitals:   12/04/11 1300 12/04/11 1413 12/04/11 1500 12/05/11 0700  BP: 140/62 136/52 138/63 146/81  Pulse: 114 110 58 85  Temp:   98.7 F (37.1 C) 98.7 F (37.1 C)  TempSrc:   Oral Oral  Resp: 20 18 18 20   Height:    5\' 5"  (1.651 m)  Weight:    162 lb (73.483 kg)  SpO2: 95% 93% 92% 94%    Intake/Output from previous day:  Intake/Output Summary (Last 24 hours) at 12/05/11 4098 Last data filed at 12/04/11 1838  Gross per 24 hour  Intake    535 ml  Output      0 ml  Net    535 ml    Physical Exam: Physical exam: Well-developed well-nourished in no acute distress.  Skin is warm and dry.  HEENT is significant for head laceration occipital area Neck is supple. No thyromegaly.  Chest is clear to auscultation with normal expansion.  Cardiovascular exam is regular rate and rhythm.  Abdominal exam nontender or distended. No masses palpated. Extremities show no edema. neuro grossly intact    Lab Results: Basic Metabolic Panel:  Basename 12/05/11 0642 12/04/11 1547  NA 139 136  K 3.3* 3.7  CL 105 99  CO2 23 24  GLUCOSE 120* 143*  BUN 14 18  CREATININE 0.68 0.66  CALCIUM 8.5 9.1  MG -- 1.6  PHOS -- 2.8   CBC:  Basename 12/05/11 0642 12/04/11 1547 12/04/11 1013  WBC 9.3 17.1* --  NEUTROABS -- 15.5* 14.6*  HGB 12.0 13.3 --  HCT 35.9* 38.9 --  MCV 91.8 91.1 --  PLT 164 196 --   Cardiac Enzymes:  Basename 12/05/11 0642 12/04/11 2223 12/04/11 1547  CKTOTAL 542* 716* 625*  CKMB 4.2* 5.7* 5.8*  CKMBINDEX -- -- --  TROPONINI <0.30 <0.30 <0.30     Assessment/Plan:  #1 question syncope-I have reviewed the patient's telemetry. She continues to have frequent PVCs and brief PAT. Echocardiogram shows an ejection fraction of 60-65%. Etiology of the events remains unclear. She has no memory of when she fell. She has stated that she has had a problem with her balance and  leg weakness for several years. Question if she fell striking her head and then subsequent loss of consciousness. Patient can be discharged from a cardiology standpoint. She needs an outpatient CardioNet monitor and Lexiscan Myoview. She will followup with me in approximately 4 weeks. #2 hypertension- I will discontinue Cozaar and instead treat with Toprol 50 mg daily. This should help both with her blood pressure and her PVCs. #3 hypothyroidism-management per primary care. #4-history of atypical chest pain-plan outpatient Myoview. Olga Millers 12/05/2011, 7:52 AM

## 2011-12-05 NOTE — Discharge Summary (Addendum)
Physician Discharge Summary  NAME:Courtney Shea  ZOX:[id number]  DOB: Apr 19, 1930   Admit date: 12/04/2011 Discharge date: 12/05/2011  Discharge Diagnoses:  Principal Problem: Syncope - possible arrhythmia. Will be discharged on beta blocker. Also had leukocytosis on admission which has resolved. Question neurocardiogenic syncope secondary to viral infection or early UTI Active Problems:  Hypothyroidism  HTN (hypertension)  UTI (lower urinary tract infection) - urinalysis normal  Ventricular bigeminy - and PAT  Leukocytosis - resolved  Head trauma secondary to syncope - laceration.   Filed Vitals:   12/04/11 1300 12/04/11 1413 12/04/11 1500 12/05/11 0700  BP: 140/62 136/52 138/63 146/81  Pulse: 114 110 58 85  Temp:   98.7 F (37.1 C) 98.7 F (37.1 C)  TempSrc:   Oral Oral  Resp: 20 18 18 20   Height:    5\' 5"  (1.651 m)  Weight:    73.483 kg (162 lb)  SpO2: 95% 93% 92% 94%    Physical exam:  Well-developed well-nourished in no acute distress.  Skin is warm and dry.  HEENT is significant for head laceration occipital area - treated with 5 staples. Wound clean and dry  Neck is supple. No thyromegaly.  Chest is clear to auscultation with normal expansion.  Cardiovascular exam reveals irregular rhythm secondary to PVCs. Rate control Abdominal exam nontender or distended. No masses palpated.  Extremities show no edema.  neuro grossly intact   Discharge Condition: Stable/improved  Hospital Course: Courtney Shea is a very pleasant 78 year old female with history of hypertension, hypothyroidism, and estrogen supplementation who suffered a syncopal episode on 12/04/2011, yesterday. She awakened finding herself on the floor but could not recall the events prior to loss of consciousness. She had never had a syndrome like this in the past. While hospitalized, she has head bigeminy, ventricular, as well as PAT. She has had no further lightheadedness or dizziness or shortness of  breath or chest pain. Plans are to discharge her home on beta blocker therapy and to perform an outpatient CardioNet monitor and Lexiscan Myoview with followup with Dr. Olga Millers in 4 weeks. I will plan to see her back in my office in one to 2 weeks for transition of care followup. Will need staples removed in approximately one week  Consults: Treatment Team:  Lewayne Bunting, MD  Disposition: Final discharge disposition not confirmed  Discharge Orders    Future Orders Please Complete By Expires   Diet - low sodium heart healthy      Increase activity slowly      Call MD for:  persistant dizziness or light-headedness      Call MD for:  temperature >100.4      Discharge instructions      Comments:   Also call if you develop chest pain or shortness of breath     Medication List  As of 12/05/2011  8:15 AM   STOP taking these medications         losartan 100 MG tablet         TAKE these medications         aspirin 81 MG tablet   Take 81 mg by mouth daily.      estrogens (conjugated) 0.45 MG tablet   Commonly known as: PREMARIN   Take 0.45 mg by mouth daily.      furosemide 20 MG tablet   Commonly known as: LASIX   Take 20 mg by mouth daily as needed. swelling      levothyroxine 88  MCG tablet   Commonly known as: SYNTHROID, LEVOTHROID   Take 88 mcg by mouth daily.      metoprolol succinate 50 MG 24 hr tablet   Commonly known as: TOPROL-XL   Take 1 tablet (50 mg total) by mouth daily. Take with or immediately following a meal.      sennosides-docusate sodium 8.6-50 MG tablet   Commonly known as: SENOKOT-S   Take 1 tablet by mouth daily as needed.             Things to follow up in the outpatient setting: Cardiac monitoring and Lexiscan Myoview as mentioned. Patient instructed to notify me if she has lightheadedness dizziness or shortness of breath or chest pain. Will need staples removed from posterior scalp for approximately one week  Time coordinating  discharge: 35 minutes  The results of significant diagnostics from this hospitalization (including imaging, microbiology, ancillary and laboratory) are listed below for reference.    Significant Diagnostic Studies: Ct Head Wo Contrast  12/04/2011  *RADIOLOGY REPORT*  Clinical Data:  Found down with altered mental status.  Posterior head laceration.  CT HEAD WITHOUT CONTRAST CT CERVICAL SPINE WITHOUT CONTRAST  Technique:  Multidetector CT imaging of the head and cervical spine was performed following the standard protocol without intravenous contrast.  Multiplanar CT image reconstructions of the cervical spine were also generated.  Comparison:  None.  CT HEAD  Findings: There is no evidence of acute intracranial hemorrhage, mass lesion, brain edema or extra-axial fluid collection.  The ventricles and subarachnoid spaces are appropriately sized for age. There is no CT evidence of acute cortical infarction.  Mild chronic small vessel ischemic changes are present within the periventricular and subcortical white matter.  There is right sphenoid sinus opacification. 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  The additional visualized paranasal sinuses are clear. The calvarium is intact. There is some soft tissue emphysema within the scalp at the vertex.  IMPRESSION:  1.  Scalp soft tissue injury. 2.  No acute intracranial or calvarial findings.  CT CERVICAL SPINE  Findings: There is a convex left scoliosis.  The lateral alignment is normal.  There is no evidence of acute fracture or traumatic subluxation.  There is multilevel spondylosis with advanced disc space loss at C5-C6 and C6-C7.  There is multilevel facet disease, asymmetric to the left.  Fragmentation of the left C7 articulating facet has sclerotic margins and does not appear acute. Ossification of the ligamentum nuchae is noted.  No acute soft tissue findings are evident.  The lung apices are clear.  IMPRESSION:  1.  No evidence of  acute cervical spine fracture, traumatic subluxation or static signs of instability. 2.  Moderate spondylosis.  Original Report Authenticated By: Gerrianne Scale, M.D.   Ct Cervical Spine Wo Contrast  12/04/2011  *RADIOLOGY REPORT*  Clinical Data:  Found down with altered mental status.  Posterior head laceration.  CT HEAD WITHOUT CONTRAST CT CERVICAL SPINE WITHOUT CONTRAST  Technique:  Multidetector CT imaging of the head and cervical spine was performed following the standard protocol without intravenous contrast.  Multiplanar CT image reconstructions of the cervical spine were also generated.  Comparison:  None.  CT HEAD  Findings: There is no evidence of acute intracranial hemorrhage, mass lesion, brain edema or extra-axial fluid collection.  The ventricles and subarachnoid spaces are appropriately sized for age. There is no CT evidence of acute cortical infarction.  Mild chronic small vessel ischemic changes are present within  the periventricular and subcortical white matter.  There is right sphenoid sinus opacification. 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  The additional visualized paranasal sinuses are clear. The calvarium is intact. There is some soft tissue emphysema within the scalp at the vertex.  IMPRESSION:  1.  Scalp soft tissue injury. 2.  No acute intracranial or calvarial findings.  CT CERVICAL SPINE  Findings: There is a convex left scoliosis.  The lateral alignment is normal.  There is no evidence of acute fracture or traumatic subluxation.  There is multilevel spondylosis with advanced disc space loss at C5-C6 and C6-C7.  There is multilevel facet disease, asymmetric to the left.  Fragmentation of the left C7 articulating facet has sclerotic margins and does not appear acute. Ossification of the ligamentum nuchae is noted.  No acute soft tissue findings are evident.  The lung apices are clear.  IMPRESSION:  1.  No evidence of acute cervical spine fracture,  traumatic subluxation or static signs of instability. 2.  Moderate spondylosis.  Original Report Authenticated By: Gerrianne Scale, M.D.    Microbiology: No results found for this or any previous visit (from the past 240 hour(s)).   Labs: Results for orders placed during the hospital encounter of 12/04/11  CBC      Component Value Range   WBC 15.8 (*) 4.0 - 10.5 (K/uL)   RBC 4.58  3.87 - 5.11 (MIL/uL)   Hemoglobin 14.2  12.0 - 15.0 (g/dL)   HCT 96.0  45.4 - 09.8 (%)   MCV 91.5  78.0 - 100.0 (fL)   MCH 31.0  26.0 - 34.0 (pg)   MCHC 33.9  30.0 - 36.0 (g/dL)   RDW 11.9  14.7 - 82.9 (%)   Platelets 196  150 - 400 (K/uL)  DIFFERENTIAL      Component Value Range   Neutrophils Relative 92 (*) 43 - 77 (%)   Neutro Abs 14.6 (*) 1.7 - 7.7 (K/uL)   Lymphocytes Relative 2 (*) 12 - 46 (%)   Lymphs Abs 0.4 (*) 0.7 - 4.0 (K/uL)   Monocytes Relative 5  3 - 12 (%)   Monocytes Absolute 0.8  0.1 - 1.0 (K/uL)   Eosinophils Relative 0  0 - 5 (%)   Eosinophils Absolute 0.0  0.0 - 0.7 (K/uL)   Basophils Relative 0  0 - 1 (%)   Basophils Absolute 0.0  0.0 - 0.1 (K/uL)  COMPREHENSIVE METABOLIC PANEL      Component Value Range   Sodium 138  135 - 145 (mEq/L)   Potassium 3.4 (*) 3.5 - 5.1 (mEq/L)   Chloride 99  96 - 112 (mEq/L)   CO2 26  19 - 32 (mEq/L)   Glucose, Bld 128 (*) 70 - 99 (mg/dL)   BUN 17  6 - 23 (mg/dL)   Creatinine, Ser 5.62  0.50 - 1.10 (mg/dL)   Calcium 9.6  8.4 - 13.0 (mg/dL)   Total Protein 7.4  6.0 - 8.3 (g/dL)   Albumin 3.8  3.5 - 5.2 (g/dL)   AST 21  0 - 37 (U/L)   ALT 19  0 - 35 (U/L)   Alkaline Phosphatase 69  39 - 117 (U/L)   Total Bilirubin 0.6  0.3 - 1.2 (mg/dL)   GFR calc non Af Amer 80 (*) >90 (mL/min)   GFR calc Af Amer >90  >90 (mL/min)  POCT I-STAT TROPONIN I      Component Value Range   Troponin  i, poc 0.00  0.00 - 0.08 (ng/mL)   Comment 3           URINALYSIS, ROUTINE W REFLEX MICROSCOPIC      Component Value Range   Color, Urine YELLOW  YELLOW     APPearance CLEAR  CLEAR    Specific Gravity, Urine 1.012  1.005 - 1.030    pH 7.5  5.0 - 8.0    Glucose, UA NEGATIVE  NEGATIVE (mg/dL)   Hgb urine dipstick TRACE (*) NEGATIVE    Bilirubin Urine NEGATIVE  NEGATIVE    Ketones, ur NEGATIVE  NEGATIVE (mg/dL)   Protein, ur NEGATIVE  NEGATIVE (mg/dL)   Urobilinogen, UA 0.2  0.0 - 1.0 (mg/dL)   Nitrite NEGATIVE  NEGATIVE    Leukocytes, UA NEGATIVE  NEGATIVE   URINE MICROSCOPIC-ADD ON      Component Value Range   Squamous Epithelial / LPF FEW (*) RARE    RBC / HPF 0-2  <3 (RBC/hpf)   Bacteria, UA RARE  RARE   COMPREHENSIVE METABOLIC PANEL      Component Value Range   Sodium 136  135 - 145 (mEq/L)   Potassium 3.7  3.5 - 5.1 (mEq/L)   Chloride 99  96 - 112 (mEq/L)   CO2 24  19 - 32 (mEq/L)   Glucose, Bld 143 (*) 70 - 99 (mg/dL)   BUN 18  6 - 23 (mg/dL)   Creatinine, Ser 8.11  0.50 - 1.10 (mg/dL)   Calcium 9.1  8.4 - 91.4 (mg/dL)   Total Protein 6.8  6.0 - 8.3 (g/dL)   Albumin 3.3 (*) 3.5 - 5.2 (g/dL)   AST 26  0 - 37 (U/L)   ALT 20  0 - 35 (U/L)   Alkaline Phosphatase 64  39 - 117 (U/L)   Total Bilirubin 0.6  0.3 - 1.2 (mg/dL)   GFR calc non Af Amer 82 (*) >90 (mL/min)   GFR calc Af Amer >90  >90 (mL/min)  MAGNESIUM      Component Value Range   Magnesium 1.6  1.5 - 2.5 (mg/dL)  PHOSPHORUS      Component Value Range   Phosphorus 2.8  2.3 - 4.6 (mg/dL)  CBC      Component Value Range   WBC 17.1 (*) 4.0 - 10.5 (K/uL)   RBC 4.27  3.87 - 5.11 (MIL/uL)   Hemoglobin 13.3  12.0 - 15.0 (g/dL)   HCT 78.2  95.6 - 21.3 (%)   MCV 91.1  78.0 - 100.0 (fL)   MCH 31.1  26.0 - 34.0 (pg)   MCHC 34.2  30.0 - 36.0 (g/dL)   RDW 08.6  57.8 - 46.9 (%)   Platelets 196  150 - 400 (K/uL)  DIFFERENTIAL      Component Value Range   Neutrophils Relative 91 (*) 43 - 77 (%)   Neutro Abs 15.5 (*) 1.7 - 7.7 (K/uL)   Lymphocytes Relative 4 (*) 12 - 46 (%)   Lymphs Abs 0.7  0.7 - 4.0 (K/uL)   Monocytes Relative 5  3 - 12 (%)   Monocytes Absolute 0.8  0.1  - 1.0 (K/uL)   Eosinophils Relative 0  0 - 5 (%)   Eosinophils Absolute 0.0  0.0 - 0.7 (K/uL)   Basophils Relative 0  0 - 1 (%)   Basophils Absolute 0.0  0.0 - 0.1 (K/uL)  APTT      Component Value Range   aPTT 28  24 - 37 (seconds)  PROTIME-INR      Component Value Range   Prothrombin Time 14.3  11.6 - 15.2 (seconds)   INR 1.09  0.00 - 1.49   TSH      Component Value Range   TSH 2.642  0.350 - 4.500 (uIU/mL)  HEMOGLOBIN A1C      Component Value Range   Hemoglobin A1C 5.6  <5.7 (%)   Mean Plasma Glucose 114  <117 (mg/dL)  CARDIAC PANEL(CRET KIN+CKTOT+MB+TROPI)      Component Value Range   Total CK 625 (*) 7 - 177 (U/L)   CK, MB 5.8 (*) 0.3 - 4.0 (ng/mL)   Troponin I <0.30  <0.30 (ng/mL)   Relative Index 0.9  0.0 - 2.5   CARDIAC PANEL(CRET KIN+CKTOT+MB+TROPI)      Component Value Range   Total CK 716 (*) 7 - 177 (U/L)   CK, MB 5.7 (*) 0.3 - 4.0 (ng/mL)   Troponin I <0.30  <0.30 (ng/mL)   Relative Index 0.8  0.0 - 2.5   BASIC METABOLIC PANEL      Component Value Range   Sodium 139  135 - 145 (mEq/L)   Potassium 3.3 (*) 3.5 - 5.1 (mEq/L)   Chloride 105  96 - 112 (mEq/L)   CO2 23  19 - 32 (mEq/L)   Glucose, Bld 120 (*) 70 - 99 (mg/dL)   BUN 14  6 - 23 (mg/dL)   Creatinine, Ser 1.61  0.50 - 1.10 (mg/dL)   Calcium 8.5  8.4 - 09.6 (mg/dL)   GFR calc non Af Amer 81 (*) >90 (mL/min)   GFR calc Af Amer >90  >90 (mL/min)  CBC      Component Value Range   WBC 9.3  4.0 - 10.5 (K/uL)   RBC 3.91  3.87 - 5.11 (MIL/uL)   Hemoglobin 12.0  12.0 - 15.0 (g/dL)   HCT 04.5 (*) 40.9 - 46.0 (%)   MCV 91.8  78.0 - 100.0 (fL)   MCH 30.7  26.0 - 34.0 (pg)   MCHC 33.4  30.0 - 36.0 (g/dL)   RDW 81.1  91.4 - 78.2 (%)   Platelets 164  150 - 400 (K/uL)  CARDIAC PANEL(CRET KIN+CKTOT+MB+TROPI)      Component Value Range   Total CK 542 (*) 7 - 177 (U/L)   CK, MB 4.2 (*) 0.3 - 4.0 (ng/mL)   Troponin I <0.30  <0.30 (ng/mL)   Relative Index 0.8  0.0 - 2.5      Signed: Maridel Pixler  NEVILL 12/05/2011, 8:15 AM

## 2011-12-05 NOTE — Evaluation (Signed)
OT Cancellation Note Treatment cancelled today due to pt being d/c home today. Orders received and chart reviewed and noted pt has d/c summary in chart stating pt to be d/c'd home today. Will sign off OT at this time.  Roselie Awkward Dixon 12/05/2011, 9:53 AM

## 2011-12-12 ENCOUNTER — Encounter (INDEPENDENT_AMBULATORY_CARE_PROVIDER_SITE_OTHER): Payer: Medicare Other

## 2011-12-12 DIAGNOSIS — R55 Syncope and collapse: Secondary | ICD-10-CM | POA: Diagnosis not present

## 2011-12-15 DIAGNOSIS — I10 Essential (primary) hypertension: Secondary | ICD-10-CM | POA: Diagnosis not present

## 2011-12-15 DIAGNOSIS — I471 Supraventricular tachycardia: Secondary | ICD-10-CM | POA: Diagnosis not present

## 2011-12-15 DIAGNOSIS — E039 Hypothyroidism, unspecified: Secondary | ICD-10-CM | POA: Diagnosis not present

## 2011-12-16 ENCOUNTER — Other Ambulatory Visit (HOSPITAL_COMMUNITY): Payer: Self-pay | Admitting: Cardiology

## 2011-12-16 DIAGNOSIS — R55 Syncope and collapse: Secondary | ICD-10-CM

## 2011-12-17 ENCOUNTER — Ambulatory Visit (HOSPITAL_COMMUNITY): Payer: Medicare Other | Attending: Cardiovascular Disease | Admitting: Radiology

## 2011-12-17 VITALS — BP 177/83 | Ht 65.5 in | Wt 165.0 lb

## 2011-12-17 DIAGNOSIS — R5381 Other malaise: Secondary | ICD-10-CM | POA: Insufficient documentation

## 2011-12-17 DIAGNOSIS — R0609 Other forms of dyspnea: Secondary | ICD-10-CM | POA: Diagnosis not present

## 2011-12-17 DIAGNOSIS — R002 Palpitations: Secondary | ICD-10-CM | POA: Diagnosis not present

## 2011-12-17 DIAGNOSIS — I10 Essential (primary) hypertension: Secondary | ICD-10-CM | POA: Insufficient documentation

## 2011-12-17 DIAGNOSIS — R079 Chest pain, unspecified: Secondary | ICD-10-CM

## 2011-12-17 DIAGNOSIS — R55 Syncope and collapse: Secondary | ICD-10-CM | POA: Diagnosis not present

## 2011-12-17 DIAGNOSIS — R42 Dizziness and giddiness: Secondary | ICD-10-CM | POA: Insufficient documentation

## 2011-12-17 DIAGNOSIS — R0789 Other chest pain: Secondary | ICD-10-CM | POA: Diagnosis not present

## 2011-12-17 DIAGNOSIS — I4949 Other premature depolarization: Secondary | ICD-10-CM | POA: Diagnosis not present

## 2011-12-17 DIAGNOSIS — R0989 Other specified symptoms and signs involving the circulatory and respiratory systems: Secondary | ICD-10-CM | POA: Insufficient documentation

## 2011-12-17 DIAGNOSIS — Z8249 Family history of ischemic heart disease and other diseases of the circulatory system: Secondary | ICD-10-CM | POA: Insufficient documentation

## 2011-12-17 MED ORDER — TECHNETIUM TC 99M TETROFOSMIN IV KIT
30.0000 | PACK | Freq: Once | INTRAVENOUS | Status: AC | PRN
  Administered 2011-12-17: 30 via INTRAVENOUS

## 2011-12-17 MED ORDER — REGADENOSON 0.4 MG/5ML IV SOLN
0.4000 mg | Freq: Once | INTRAVENOUS | Status: AC
  Administered 2011-12-17: 0.4 mg via INTRAVENOUS

## 2011-12-17 MED ORDER — TECHNETIUM TC 99M TETROFOSMIN IV KIT
10.0000 | PACK | Freq: Once | INTRAVENOUS | Status: AC | PRN
  Administered 2011-12-17: 10 via INTRAVENOUS

## 2011-12-17 NOTE — Progress Notes (Signed)
[facility] SITE 3 NUCLEAR MED [address] [place] [address] [phone]  Cardiology Nuclear Med Study  Courtney Shea is a 77 y.o. female [id number] 1930-08-17   Nuclear Med Background Indication for Stress Test:  Evaluation for Ischemia and 12/04/11 Post Hospital with Syncope, frequent PVC's and brief PAT, (-) enzymes History: 12/04/11 Echo:EF=60-65%, moderate LCH Cardiac Risk Factors: Family History - CAD and Hypertension  Symptoms:  Chest Pressure  (last episode of chest discomfort was 1-2 months ago), DOE, Fatigue, Light-Headedness, Palpitations and Syncope on 12/04/11   Nuclear Pre-Procedure Caffeine/Decaff Intake:  None NPO After: 7:30am   Lungs:  Clear.  O2 SAT 97% on RA. IV 0.9% NS with Angio Cath:  20g  IV Site: R Wrist  IV Started by:  Stanton Kidney, EMT-P  Chest Size (in):  34 Cup Size: D  Height: 5' 5.5" (1.664 m)  Weight:  165 lb (74.844 kg)  BMI:  Body mass index is 27.04 kg/(m^2). Tech Comments:  Toprol held this am, as directed.    Nuclear Med Study 1 or 2 day study: 1 day  Stress Test Type:  Eugenie Birks  Reading MD: Charlton Haws, MD  Order Authorizing Provider:  Olga Millers, MD  Resting Radionuclide: Technetium 48m Tetrofosmin  Resting Radionuclide Dose: 10.7 mCi   Stress Radionuclide:  Technetium 65m Tetrofosmin  Stress Radionuclide Dose: 33.0 mCi           Stress Protocol Rest HR: 69 Stress HR: 118  Rest BP: 177/83 Stress BP: 173/79  Exercise Time (min): n/a METS: n/a   Predicted Max HR: 141 bpm % Max HR: 83.69 bpm Rate Pressure Product: 46962   Dose of Adenosine (mg):  n/a Dose of Lexiscan: 0.4 mg  Dose of Atropine (mg): n/a Dose of Dobutamine: n/a mcg/kg/min (at max HR)  Stress Test Technologist: Smiley Houseman, CMA-N  Nuclear Technologist:  Doyne Keel, CNMT     Rest Procedure:  Myocardial perfusion imaging was performed at rest 45 minutes following the intravenous administration of Technetium 92m Tetrofosmin.  Rest ECG:  No acute changes, occasional PVC's and PAC's noted.   Stress Procedure:  The patient received IV Lexiscan 0.4 mg over 15-seconds.  Technetium 66m Tetrofosmin injected at 30-seconds.  There were nonspecific T-wave changes and frequent PVC's with trigeminy during Lexiscan.  She did c/o chest tightness, 7-8/10, with Lexiscan.  Quantitative spect images were obtained after a 45 minute delay.  Stress ECG: No significant change from baseline ECG  QPS Raw Data Images:  Normal; no motion artifact; normal heart/lung ratio. Stress Images:  Normal homogeneous uptake in all areas of the myocardium. Rest Images:  Normal homogeneous uptake in all areas of the myocardium. Subtraction (SDS):  Normal Transient Ischemic Dilatation (Normal <1.22):  0.82 Lung/Heart Ratio (Normal <0.45):  0.28  Quantitative Gated Spect Images QGS EDV:  n/a QGS ESV:  N/a  QGS cine images:  Study not gated QGS EF: Study not gated  Impression Exercise Capacity:  Lexiscan with no exercise. BP Response:  Normal blood pressure response. Clinical Symptoms:  Mild chest pain/dyspnea. ECG Impression:  No significant ST segment change suggestive of ischemia. Comparison with Prior Nuclear Study: No previous nuclear study performed  Overall Impression:  Normal stress nuclear study. Study not gated and no EF due to PVC;s   [employer]

## 2011-12-18 DIAGNOSIS — R0789 Other chest pain: Secondary | ICD-10-CM | POA: Diagnosis not present

## 2011-12-18 DIAGNOSIS — R209 Unspecified disturbances of skin sensation: Secondary | ICD-10-CM | POA: Diagnosis not present

## 2011-12-18 DIAGNOSIS — I10 Essential (primary) hypertension: Secondary | ICD-10-CM | POA: Diagnosis not present

## 2011-12-29 DIAGNOSIS — R0789 Other chest pain: Secondary | ICD-10-CM | POA: Diagnosis not present

## 2011-12-29 DIAGNOSIS — R209 Unspecified disturbances of skin sensation: Secondary | ICD-10-CM | POA: Diagnosis not present

## 2011-12-29 DIAGNOSIS — I10 Essential (primary) hypertension: Secondary | ICD-10-CM | POA: Diagnosis not present

## 2012-01-02 DIAGNOSIS — E039 Hypothyroidism, unspecified: Secondary | ICD-10-CM | POA: Diagnosis not present

## 2012-01-02 DIAGNOSIS — I10 Essential (primary) hypertension: Secondary | ICD-10-CM | POA: Diagnosis not present

## 2012-01-02 DIAGNOSIS — I471 Supraventricular tachycardia: Secondary | ICD-10-CM | POA: Diagnosis not present

## 2012-01-05 DIAGNOSIS — J209 Acute bronchitis, unspecified: Secondary | ICD-10-CM | POA: Diagnosis not present

## 2012-01-05 DIAGNOSIS — I10 Essential (primary) hypertension: Secondary | ICD-10-CM | POA: Diagnosis not present

## 2012-01-05 DIAGNOSIS — R0789 Other chest pain: Secondary | ICD-10-CM | POA: Diagnosis not present

## 2012-01-05 DIAGNOSIS — E039 Hypothyroidism, unspecified: Secondary | ICD-10-CM | POA: Diagnosis not present

## 2012-01-12 ENCOUNTER — Telehealth: Payer: Self-pay | Admitting: *Deleted

## 2012-01-12 NOTE — Telephone Encounter (Signed)
Spoke with pt, aware monitor reviewed by dr Jens Som shows sinus with PAC's, PVC's, isolated couplet and rare junctional escape beat. She has a follow up appt 01-22-12.

## 2012-01-21 ENCOUNTER — Encounter: Payer: Self-pay | Admitting: *Deleted

## 2012-01-22 ENCOUNTER — Ambulatory Visit (INDEPENDENT_AMBULATORY_CARE_PROVIDER_SITE_OTHER): Payer: Medicare Other | Admitting: Cardiology

## 2012-01-22 ENCOUNTER — Encounter: Payer: Self-pay | Admitting: Cardiology

## 2012-01-22 VITALS — BP 153/76 | HR 59 | Wt 165.0 lb

## 2012-01-22 DIAGNOSIS — I10 Essential (primary) hypertension: Secondary | ICD-10-CM | POA: Diagnosis not present

## 2012-01-22 DIAGNOSIS — R55 Syncope and collapse: Secondary | ICD-10-CM | POA: Diagnosis not present

## 2012-01-22 NOTE — Assessment & Plan Note (Signed)
No recurrent episodes. LV function normal. I will see her back in 3 months. If no further episodes we will see back as needed. If she has recurrent events in the future we may need to consider an implantable loop.

## 2012-01-22 NOTE — Progress Notes (Signed)
HPI: Pleasant 78 year old female for followup of syncope. Recently admitted with question syncopal episode. The patient recalls walking into her den but then has no recollection of events. She was found with a laceration on her head. Note she did state that she has had problems with her balance and wonders if she fell because of that and became unconscious because of striking her head. Echocardiogram in February of 2013 showed normal LV function and mild right atrial enlargement. Myoview in February of 2013 showed normal perfusion. The study was not gated. CardioNet showed sinus rhythm with PACs, PVCs, isolated couplet and rare junctional escape beat. Since I last saw her, the patient has dyspnea with more extreme activities but not with routine activities. It is relieved with rest. It is not associated with chest pain. There is no orthopnea, PND or pedal edema. There is no syncope or palpitations. There is no exertional chest pain.   Current Outpatient Prescriptions  Medication Sig Dispense Refill  . aspirin 81 MG tablet 2 tabs po qd      . Calcium Carbonate-Vitamin D (CALCIUM 600 + D PO) Take 1 tablet by mouth daily.      . Cholecalciferol 1000 UNITS capsule Take 1,000 Units by mouth daily.      Marland Kitchen estrogens, conjugated, (PREMARIN) 0.3 MG tablet Take 0.3 mg by mouth daily. Take daily for 21 days then do not take for 7 days.      . furosemide (LASIX) 20 MG tablet 1 tab po Mon. Wed, Friday      . levothyroxine (SYNTHROID, LEVOTHROID) 88 MCG tablet Take 88 mcg by mouth daily.      Marland Kitchen losartan (COZAAR) 100 MG tablet Take 100 mg by mouth daily.      . metoprolol succinate (TOPROL-XL) 50 MG 24 hr tablet Take 1 tablet (50 mg total) by mouth daily. Take with or immediately following a meal.  30 tablet  1  . omeprazole (PRILOSEC) 20 MG capsule Take 20 mg by mouth daily.      . sennosides-docusate sodium (SENOKOT-S) 8.6-50 MG tablet Take 1 tablet by mouth daily as needed.         Past Medical History    Diagnosis Date  . Hypertension   . Hypothyroid   . GERD (gastroesophageal reflux disease)   . Syncope   . Ventricular bigeminy     Past Surgical History  Procedure Date  . Abdominal hysterectomy   . Ovarian cyst removed   . Incontinence surgery     History   Social History  . Marital Status: Married    Spouse Name: N/A    Number of Children: N/A  . Years of Education: N/A   Occupational History  . Not on file.   Social History Main Topics  . Smoking status: Never Smoker   . Smokeless tobacco: Never Used  . Alcohol Use: No  . Drug Use: No  . Sexually Active: No   Other Topics Concern  . Not on file   Social History Narrative  . No narrative on file    ROS: no fevers or chills, productive cough, hemoptysis, dysphasia, odynophagia, melena, hematochezia, dysuria, hematuria, rash, seizure activity, orthopnea, PND, pedal edema, claudication. Remaining systems are negative.  Physical Exam: Well-developed well-nourished in no acute distress.  Skin is warm and dry.  HEENT is normal.  Neck is supple. No thyromegaly.  Chest is clear to auscultation with normal expansion.  Cardiovascular exam is regular rate and rhythm.  Abdominal exam nontender or  distended. No masses palpated. Extremities show no edema. neuro grossly intact

## 2012-01-22 NOTE — Patient Instructions (Signed)
Your physician recommends that you schedule a follow-up appointment in 3 MONTHS. 

## 2012-01-22 NOTE — Assessment & Plan Note (Signed)
Blood pressure mildly elevated. We will follow this and adjust medicines as needed. 

## 2012-02-03 ENCOUNTER — Other Ambulatory Visit: Payer: Self-pay | Admitting: Dermatology

## 2012-02-03 DIAGNOSIS — D485 Neoplasm of uncertain behavior of skin: Secondary | ICD-10-CM | POA: Diagnosis not present

## 2012-02-03 DIAGNOSIS — D2330 Other benign neoplasm of skin of unspecified part of face: Secondary | ICD-10-CM | POA: Diagnosis not present

## 2012-02-03 DIAGNOSIS — D239 Other benign neoplasm of skin, unspecified: Secondary | ICD-10-CM | POA: Diagnosis not present

## 2012-02-03 DIAGNOSIS — C44711 Basal cell carcinoma of skin of unspecified lower limb, including hip: Secondary | ICD-10-CM | POA: Diagnosis not present

## 2012-02-03 DIAGNOSIS — Z85828 Personal history of other malignant neoplasm of skin: Secondary | ICD-10-CM | POA: Diagnosis not present

## 2012-02-06 DIAGNOSIS — E559 Vitamin D deficiency, unspecified: Secondary | ICD-10-CM | POA: Diagnosis not present

## 2012-02-06 DIAGNOSIS — E039 Hypothyroidism, unspecified: Secondary | ICD-10-CM | POA: Diagnosis not present

## 2012-02-06 DIAGNOSIS — I10 Essential (primary) hypertension: Secondary | ICD-10-CM | POA: Diagnosis not present

## 2012-02-06 DIAGNOSIS — R0789 Other chest pain: Secondary | ICD-10-CM | POA: Diagnosis not present

## 2012-04-16 ENCOUNTER — Encounter: Payer: Self-pay | Admitting: Cardiology

## 2012-04-16 ENCOUNTER — Ambulatory Visit (INDEPENDENT_AMBULATORY_CARE_PROVIDER_SITE_OTHER): Payer: Medicare Other | Admitting: Cardiology

## 2012-04-16 VITALS — BP 148/82 | HR 59 | Wt 167.0 lb

## 2012-04-16 DIAGNOSIS — I10 Essential (primary) hypertension: Secondary | ICD-10-CM | POA: Diagnosis not present

## 2012-04-16 DIAGNOSIS — R55 Syncope and collapse: Secondary | ICD-10-CM | POA: Diagnosis not present

## 2012-04-16 NOTE — Patient Instructions (Addendum)
Your physician recommends that you schedule a follow-up appointment in: as needed  

## 2012-04-16 NOTE — Assessment & Plan Note (Signed)
Blood pressure mildly elevated. Continue present medications. Followup primary care and increase medications as needed.

## 2012-04-16 NOTE — Assessment & Plan Note (Signed)
No recurrent episodes. Previous cardiac workup unremarkable. No plans for further evaluation. If she has recurrent episodes in the future we will consider implantable loop.

## 2012-04-16 NOTE — Progress Notes (Signed)
HPI: Pleasant 78 year old female for followup of syncope. Previously admitted with question syncopal episode. The patient recalls walking into her den but then has no recollection of events. She was found with a laceration on her head. Note she did state that she has had problems with her balance and wonders if she fell because of that and became unconscious because of striking her head. Echocardiogram in February of 2013 showed normal LV function and mild right atrial enlargement. Myoview in February of 2013 showed normal perfusion. The study was not gated. CardioNet showed sinus rhythm with PACs, PVCs, isolated couplet and rare junctional escape beat. Since I last saw her in March of 2013, the patient denies any dyspnea on exertion, orthopnea, PND, pedal edema, palpitations, syncope or chest pain.    Current Outpatient Prescriptions  Medication Sig Dispense Refill  . aspirin 81 MG tablet 2 tabs po qd      . Calcium Carbonate-Vitamin D (CALCIUM 600 + D PO) Take 1 tablet by mouth daily.      . Cholecalciferol 1000 UNITS capsule Take 1,000 Units by mouth daily.      Marland Kitchen estrogens, conjugated, (PREMARIN) 0.3 MG tablet Take 0.3 mg by mouth daily. Take daily for 21 days then do not take for 7 days.      . furosemide (LASIX) 20 MG tablet 1 tab po Mon. Wed, Friday      . levothyroxine (SYNTHROID, LEVOTHROID) 88 MCG tablet Take 88 mcg by mouth daily.      Marland Kitchen losartan (COZAAR) 100 MG tablet Take 100 mg by mouth daily.      . metoprolol succinate (TOPROL-XL) 50 MG 24 hr tablet Take 1 tablet (50 mg total) by mouth daily. Take with or immediately following a meal.  30 tablet  1  . omeprazole (PRILOSEC) 20 MG capsule Take 20 mg by mouth daily.      . sennosides-docusate sodium (SENOKOT-S) 8.6-50 MG tablet Take 1 tablet by mouth daily as needed.         Past Medical History  Diagnosis Date  . Hypertension   . Hypothyroid   . GERD (gastroesophageal reflux disease)   . Syncope   . Ventricular bigeminy      Past Surgical History  Procedure Date  . Abdominal hysterectomy   . Ovarian cyst removed   . Incontinence surgery     History   Social History  . Marital Status: Married    Spouse Name: N/A    Number of Children: N/A  . Years of Education: N/A   Occupational History  . Not on file.   Social History Main Topics  . Smoking status: Never Smoker   . Smokeless tobacco: Never Used  . Alcohol Use: No  . Drug Use: No  . Sexually Active: No   Other Topics Concern  . Not on file   Social History Narrative  . No narrative on file    ROS: Some problems with balance but no fevers or chills, productive cough, hemoptysis, dysphasia, odynophagia, melena, hematochezia, dysuria, hematuria, rash, seizure activity, orthopnea, PND, pedal edema, claudication. Remaining systems are negative.  Physical Exam: Well-developed well-nourished in no acute distress.  Skin is warm and dry.  HEENT is normal.  Neck is supple.  Chest is clear to auscultation with normal expansion.  Cardiovascular exam is regular rate and rhythm.  Abdominal exam nontender or distended. No masses palpated. Extremities show no edema. neuro grossly intact  ECG sinus rhythm at a rate of 59. Axis normal.  Nonspecific ST changes.

## 2012-05-04 DIAGNOSIS — Z1331 Encounter for screening for depression: Secondary | ICD-10-CM | POA: Diagnosis not present

## 2012-05-04 DIAGNOSIS — Z0000 Encounter for general adult medical examination without abnormal findings: Secondary | ICD-10-CM | POA: Diagnosis not present

## 2012-07-06 DIAGNOSIS — L84 Corns and callosities: Secondary | ICD-10-CM | POA: Diagnosis not present

## 2012-07-06 DIAGNOSIS — Q828 Other specified congenital malformations of skin: Secondary | ICD-10-CM | POA: Diagnosis not present

## 2012-07-19 DIAGNOSIS — Z1212 Encounter for screening for malignant neoplasm of rectum: Secondary | ICD-10-CM | POA: Diagnosis not present

## 2012-07-19 DIAGNOSIS — Z1231 Encounter for screening mammogram for malignant neoplasm of breast: Secondary | ICD-10-CM | POA: Diagnosis not present

## 2012-07-19 DIAGNOSIS — Z124 Encounter for screening for malignant neoplasm of cervix: Secondary | ICD-10-CM | POA: Diagnosis not present

## 2012-07-19 DIAGNOSIS — Z130 Encounter for screening for diseases of the blood and blood-forming organs and certain disorders involving the immune mechanism: Secondary | ICD-10-CM | POA: Diagnosis not present

## 2012-07-19 DIAGNOSIS — Z01419 Encounter for gynecological examination (general) (routine) without abnormal findings: Secondary | ICD-10-CM | POA: Diagnosis not present

## 2012-07-22 ENCOUNTER — Other Ambulatory Visit: Payer: Self-pay | Admitting: Obstetrics & Gynecology

## 2012-07-22 DIAGNOSIS — R928 Other abnormal and inconclusive findings on diagnostic imaging of breast: Secondary | ICD-10-CM

## 2012-07-25 DIAGNOSIS — Z23 Encounter for immunization: Secondary | ICD-10-CM | POA: Diagnosis not present

## 2012-07-26 ENCOUNTER — Ambulatory Visit
Admission: RE | Admit: 2012-07-26 | Discharge: 2012-07-26 | Disposition: A | Discharge status: Home / Self Care | Payer: Medicare Other | Source: Ambulatory Visit | Attending: Obstetrics & Gynecology | Admitting: Obstetrics & Gynecology

## 2012-07-26 DIAGNOSIS — R928 Other abnormal and inconclusive findings on diagnostic imaging of breast: Secondary | ICD-10-CM

## 2012-07-26 DIAGNOSIS — N63 Unspecified lump in unspecified breast: Secondary | ICD-10-CM | POA: Diagnosis not present

## 2013-01-28 ENCOUNTER — Other Ambulatory Visit: Payer: Self-pay | Admitting: Obstetrics & Gynecology

## 2013-01-28 DIAGNOSIS — N63 Unspecified lump in unspecified breast: Secondary | ICD-10-CM

## 2013-02-03 ENCOUNTER — Other Ambulatory Visit: Payer: Medicare Other

## 2013-02-04 ENCOUNTER — Ambulatory Visit
Admission: RE | Admit: 2013-02-04 | Discharge: 2013-02-04 | Disposition: A | Discharge status: Home / Self Care | Payer: Medicare Other | Source: Ambulatory Visit | Attending: Obstetrics & Gynecology | Admitting: Obstetrics & Gynecology

## 2013-02-04 DIAGNOSIS — N63 Unspecified lump in unspecified breast: Secondary | ICD-10-CM

## 2013-05-06 DIAGNOSIS — Z0000 Encounter for general adult medical examination without abnormal findings: Secondary | ICD-10-CM | POA: Diagnosis not present

## 2013-05-06 DIAGNOSIS — I10 Essential (primary) hypertension: Secondary | ICD-10-CM | POA: Diagnosis not present

## 2013-05-06 DIAGNOSIS — I498 Other specified cardiac arrhythmias: Secondary | ICD-10-CM | POA: Diagnosis not present

## 2013-05-06 DIAGNOSIS — E559 Vitamin D deficiency, unspecified: Secondary | ICD-10-CM | POA: Diagnosis not present

## 2013-05-06 DIAGNOSIS — K219 Gastro-esophageal reflux disease without esophagitis: Secondary | ICD-10-CM | POA: Diagnosis not present

## 2013-05-06 DIAGNOSIS — R498 Other voice and resonance disorders: Secondary | ICD-10-CM | POA: Diagnosis not present

## 2013-05-06 DIAGNOSIS — Z1331 Encounter for screening for depression: Secondary | ICD-10-CM | POA: Diagnosis not present

## 2013-05-06 DIAGNOSIS — Z79899 Other long term (current) drug therapy: Secondary | ICD-10-CM | POA: Diagnosis not present

## 2013-05-06 DIAGNOSIS — E039 Hypothyroidism, unspecified: Secondary | ICD-10-CM | POA: Diagnosis not present

## 2013-06-06 DIAGNOSIS — R498 Other voice and resonance disorders: Secondary | ICD-10-CM | POA: Diagnosis not present

## 2013-06-06 DIAGNOSIS — I10 Essential (primary) hypertension: Secondary | ICD-10-CM | POA: Diagnosis not present

## 2013-06-20 ENCOUNTER — Encounter (HOSPITAL_COMMUNITY): Payer: Self-pay | Admitting: Emergency Medicine

## 2013-06-20 ENCOUNTER — Emergency Department (HOSPITAL_COMMUNITY): Payer: Medicare Other

## 2013-06-20 ENCOUNTER — Observation Stay (HOSPITAL_COMMUNITY)
Admission: EM | Admit: 2013-06-20 | Discharge: 2013-06-21 | Disposition: A | Discharge status: Home / Self Care | Payer: Medicare Other | Attending: Internal Medicine | Admitting: Internal Medicine

## 2013-06-20 DIAGNOSIS — I10 Essential (primary) hypertension: Secondary | ICD-10-CM

## 2013-06-20 DIAGNOSIS — E039 Hypothyroidism, unspecified: Secondary | ICD-10-CM | POA: Diagnosis present

## 2013-06-20 DIAGNOSIS — Z79890 Hormone replacement therapy (postmenopausal): Secondary | ICD-10-CM | POA: Insufficient documentation

## 2013-06-20 DIAGNOSIS — E876 Hypokalemia: Secondary | ICD-10-CM | POA: Diagnosis not present

## 2013-06-20 DIAGNOSIS — Z7902 Long term (current) use of antithrombotics/antiplatelets: Secondary | ICD-10-CM | POA: Diagnosis not present

## 2013-06-20 DIAGNOSIS — R5381 Other malaise: Secondary | ICD-10-CM | POA: Diagnosis not present

## 2013-06-20 DIAGNOSIS — I498 Other specified cardiac arrhythmias: Secondary | ICD-10-CM | POA: Diagnosis not present

## 2013-06-20 DIAGNOSIS — K219 Gastro-esophageal reflux disease without esophagitis: Secondary | ICD-10-CM | POA: Insufficient documentation

## 2013-06-20 DIAGNOSIS — G459 Transient cerebral ischemic attack, unspecified: Principal | ICD-10-CM

## 2013-06-20 DIAGNOSIS — Z79899 Other long term (current) drug therapy: Secondary | ICD-10-CM | POA: Diagnosis not present

## 2013-06-20 DIAGNOSIS — I059 Rheumatic mitral valve disease, unspecified: Secondary | ICD-10-CM

## 2013-06-20 DIAGNOSIS — D72829 Elevated white blood cell count, unspecified: Secondary | ICD-10-CM

## 2013-06-20 LAB — COMPREHENSIVE METABOLIC PANEL
ALT: 30 U/L (ref 0–35)
Alkaline Phosphatase: 66 U/L (ref 39–117)
CO2: 27 mEq/L (ref 19–32)
GFR calc Af Amer: >90 mL/min (ref >90)
GFR calc non Af Amer: 81 mL/min — ABNORMAL LOW (ref >90)
Glucose, Bld: 119 mg/dL — ABNORMAL HIGH (ref 70–99)
Potassium: 3.4 mEq/L — ABNORMAL LOW (ref 3.5–5.1)
Sodium: 141 mEq/L (ref 135–145)

## 2013-06-20 LAB — CBC
MCV: 91.1 fL (ref 78.0–100.0)
Platelets: 219 10*3/uL (ref 150–400)
RBC: 4.4 MIL/uL (ref 3.87–5.11)
WBC: 6.6 10*3/uL (ref 4.0–10.5)

## 2013-06-20 LAB — HEMOGLOBIN A1C: Hgb A1c MFr Bld: 5.9 % — ABNORMAL HIGH (ref <5.7)

## 2013-06-20 LAB — PROTIME-INR
INR: 0.91 (ref 0.00–1.49)
Prothrombin Time: 12.1 seconds (ref 11.6–15.2)

## 2013-06-20 LAB — RAPID URINE DRUG SCREEN, HOSP PERFORMED
Barbiturates: NOT DETECTED
Cocaine: NOT DETECTED
Opiates: NOT DETECTED
Tetrahydrocannabinol: NOT DETECTED

## 2013-06-20 LAB — URINALYSIS W MICROSCOPIC + REFLEX CULTURE
Nitrite: NEGATIVE
Protein, ur: NEGATIVE mg/dL
Specific Gravity, Urine: 1.004 — ABNORMAL LOW (ref 1.005–1.030)
Urobilinogen, UA: 0.2 mg/dL (ref 0.0–1.0)

## 2013-06-20 LAB — DIFFERENTIAL
Lymphocytes Relative: 29 % (ref 12–46)
Lymphs Abs: 1.9 10*3/uL (ref 0.7–4.0)
Neutrophils Relative %: 62 % (ref 43–77)

## 2013-06-20 LAB — GLUCOSE, CAPILLARY: Glucose-Capillary: 124 mg/dL — ABNORMAL HIGH (ref 70–99)

## 2013-06-20 LAB — TROPONIN I: Troponin I: <0.3 ng/mL (ref <0.30)

## 2013-06-20 MED ORDER — LEVOTHYROXINE SODIUM 88 MCG PO TABS
88.0000 ug | ORAL_TABLET | Freq: Every day | ORAL | Status: DC
  Administered 2013-06-21: 88 ug via ORAL
  Filled 2013-06-20: qty 1

## 2013-06-20 MED ORDER — LOSARTAN POTASSIUM 50 MG PO TABS
100.0000 mg | ORAL_TABLET | Freq: Every day | ORAL | Status: DC
  Administered 2013-06-21: 100 mg via ORAL
  Filled 2013-06-20: qty 2

## 2013-06-20 MED ORDER — POTASSIUM CHLORIDE CRYS ER 20 MEQ PO TBCR
40.0000 meq | EXTENDED_RELEASE_TABLET | Freq: Once | ORAL | Status: AC
  Administered 2013-06-20: 40 meq via ORAL
  Filled 2013-06-20: qty 2

## 2013-06-20 MED ORDER — ENOXAPARIN SODIUM 40 MG/0.4ML ~~LOC~~ SOLN
40.0000 mg | SUBCUTANEOUS | Status: DC
  Administered 2013-06-20: 40 mg via SUBCUTANEOUS
  Filled 2013-06-20 (×2): qty 0.4

## 2013-06-20 MED ORDER — METOPROLOL SUCCINATE ER 50 MG PO TB24
50.0000 mg | ORAL_TABLET | Freq: Every day | ORAL | Status: DC
  Administered 2013-06-21: 50 mg via ORAL
  Filled 2013-06-20: qty 1

## 2013-06-20 MED ORDER — ASPIRIN 325 MG PO TABS
325.0000 mg | ORAL_TABLET | Freq: Every day | ORAL | Status: DC
  Administered 2013-06-20 – 2013-06-21 (×2): 325 mg via ORAL
  Filled 2013-06-20 (×2): qty 1

## 2013-06-20 MED ORDER — AMLODIPINE BESYLATE 5 MG PO TABS
5.0000 mg | ORAL_TABLET | Freq: Every day | ORAL | Status: DC
  Administered 2013-06-21: 5 mg via ORAL
  Filled 2013-06-20: qty 1

## 2013-06-20 MED ORDER — PANTOPRAZOLE SODIUM 40 MG PO TBEC
40.0000 mg | DELAYED_RELEASE_TABLET | Freq: Every day | ORAL | Status: DC
  Administered 2013-06-21: 40 mg via ORAL

## 2013-06-20 NOTE — Progress Notes (Signed)
  Echocardiogram 2D Echocardiogram has been performed.  Courtney Shea 06/20/2013, 3:24 PM 

## 2013-06-20 NOTE — Progress Notes (Signed)
*  PRELIMINARY RESULTS* Vascular Ultrasound Carotid Duplex (Doppler) has been completed.   Findings suggest 1-39% internal carotid artery stenosis bilaterally. Vertebral arteries are patent with antegrade flow.  06/20/2013 2:44 PM Gertie Fey, RVT, RDCS, RDMS

## 2013-06-20 NOTE — ED Provider Notes (Signed)
CSN: [id number]     Arrival date & time 06/20/13  4098 History     First MD Initiated Contact with Patient 06/20/13 [phone]     Chief Complaint  Patient presents with  . Transient Ischemic Attack    HPI Pt was seen at 0755. Per pt and her husband, c/o sudden4 onset and resolution of one episode of RUE weakness and paresthesias that began this morning approx 0630/0645. Pt states she was sitting at breakfast with her husband a decided to get up and get a different fork to use to eat. Pt states when she returned to the table she "couldn't use my right arm" because "it kept falling down." States her entire right arm also "felt funny." States this was followed by a feeling of generalized weakness/fatigue. Pt's husband states this episode lasted approximately 10 minutes before completely resolving. Denies return of symptoms. Denies CP/palpitations, no SOB/cough, no abd pain, no N/V/D, no back pain, no headache, no visual changes, no slurred speech, no dysphagia, no facial droop, no syncope.       Past Medical History  Diagnosis Date  . Hypertension   . Hypothyroid   . GERD (gastroesophageal reflux disease)   . Syncope   . Ventricular bigeminy    Past Surgical History  Procedure Laterality Date  . Abdominal hysterectomy    . Ovarian cyst removed    . Incontinence surgery     Family History  Problem Relation Age of Onset  . Coronary artery disease Mother     MI at age 14   History  Substance Use Topics  . Smoking status: Never Smoker   . Smokeless tobacco: Never Used  . Alcohol Use: No    Review of Systems ROS: Statement: All systems negative except as marked or noted in the HPI; Constitutional: Negative for fever and chills. ; ; Eyes: Negative for eye pain, redness and discharge. ; ; ENMT: Negative for ear pain, hoarseness, nasal congestion, sinus pressure and sore throat. ; ; Cardiovascular: Negative for chest pain, palpitations, diaphoresis, dyspnea and peripheral edema. ; ;  Respiratory: Negative for cough, wheezing and stridor. ; ; Gastrointestinal: Negative for nausea, vomiting, diarrhea, abdominal pain, blood in stool, hematemesis, jaundice and rectal bleeding. . ; ; Genitourinary: Negative for dysuria, flank pain and hematuria. ; ; Musculoskeletal: Negative for back pain and neck pain. Negative for swelling and trauma.; ; Skin: Negative for pruritus, rash, abrasions, blisters, bruising and skin lesion.; ; Neuro: +RUE weakness, paresthesias. Negative for headache, lightheadedness and neck stiffness. Negative for altered level of consciousness , altered mental status, involuntary movement, seizure and syncope.       Allergies  Review of patient's allergies indicates no known allergies.  Home Medications   Current Outpatient Rx  Name  Route  Sig  Dispense  Refill  . aspirin 81 MG tablet      2 tabs po qd         . Calcium Carbonate-Vitamin D (CALCIUM 600 + D PO)   Oral   Take 1 tablet by mouth daily.         . Cholecalciferol 1000 UNITS capsule   Oral   Take 1,000 Units by mouth daily.         Marland Kitchen estrogens, conjugated, (PREMARIN) 0.3 MG tablet   Oral   Take 0.3 mg by mouth daily. Take daily for 21 days then do not take for 7 days.         . furosemide (LASIX) 20 MG tablet  1 tab po Mon. Wed, Friday         . levothyroxine (SYNTHROID, LEVOTHROID) 88 MCG tablet   Oral   Take 88 mcg by mouth daily.         Marland Kitchen losartan (COZAAR) 100 MG tablet   Oral   Take 100 mg by mouth daily.         Marland Kitchen EXPIRED: metoprolol succinate (TOPROL-XL) 50 MG 24 hr tablet   Oral   Take 1 tablet (50 mg total) by mouth daily. Take with or immediately following a meal.   30 tablet   1   . omeprazole (PRILOSEC) 20 MG capsule   Oral   Take 20 mg by mouth daily.         . sennosides-docusate sodium (SENOKOT-S) 8.6-50 MG tablet   Oral   Take 1 tablet by mouth daily as needed.          BP 150/61  Pulse 78  Temp(Src) 98 F (36.7 C) (Oral)  Resp  16  SpO2 98% Physical Exam 0800: Physical examination:  Nursing notes reviewed; Vital signs and O2 SAT reviewed;  Constitutional: Well developed, Well nourished, Well hydrated, In no acute distress; Head:  Normocephalic, atraumatic; Eyes: EOMI, PERRL, No scleral icterus; ENMT: Mouth and pharynx normal, Mucous membranes moist; Neck: Supple, Full range of motion, No lymphadenopathy; Cardiovascular: Regular rate and rhythm, No gallop; Respiratory: Breath sounds clear & equal bilaterally, No rales, rhonchi, wheezes.  Speaking full sentences with ease, Normal respiratory effort/excursion; Chest: Nontender, Movement normal; Abdomen: Soft, Nontender, Nondistended, Normal bowel sounds; Genitourinary: No CVA tenderness; Extremities: Pulses normal, No tenderness, No edema, No calf edema or asymmetry.; Neuro: AA&Ox3, Major CN grossly intact.  Strength 5/5 equal bilat UE's and LE's.  DTR 2/4 equal bilat UE's and LE's.  No gross sensory deficits.  Normal cerebellar testing bilat UE's (finger-nose) and LE's (heel-shin). No drift x4 extremities. Speech clear.  No facial droop..; Skin: Color normal, Warm, Dry.   ED Course   Procedures   MDM  MDM Reviewed: previous chart, nursing note and vitals Reviewed previous: labs and ECG Interpretation: labs, ECG, x-ray and CT scan    Date: 06/20/2013  Rate: 70  Rhythm: normal sinus rhythm  QRS Axis: normal  Intervals: normal  ST/T Wave abnormalities: nonspecific ST/T changes  Conduction Disutrbances:none  Narrative Interpretation:   Old EKG Reviewed: changes noted; new NS STTW changes compared to previous EKG dated 12/04/2011.  Results for orders placed during the hospital encounter of 06/20/13  GLUCOSE, CAPILLARY      Result Value Range   Glucose-Capillary 124 (*) 70 - 99 mg/dL   Comment 1 Documented in Chart     Comment 2 Notify RN    ETHANOL      Result Value Range   Alcohol, Ethyl (B) <11  0 - 11 mg/dL  PROTIME-INR      Result Value Range   Prothrombin  Time 12.1  11.6 - 15.2 seconds   INR 0.91  0.00 - 1.49  APTT      Result Value Range   aPTT 27  24 - 37 seconds  CBC      Result Value Range   WBC 6.6  4.0 - 10.5 K/uL   RBC 4.40  3.87 - 5.11 MIL/uL   Hemoglobin 13.9  12.0 - 15.0 g/dL   HCT 40.9  81.1 - 91.4 %   MCV 91.1  78.0 - 100.0 fL   MCH 31.6  26.0 - 34.0 pg  MCHC 34.7  30.0 - 36.0 g/dL   RDW 16.1  09.6 - 04.5 %   Platelets 219  150 - 400 K/uL  DIFFERENTIAL      Result Value Range   Neutrophils Relative % 62  43 - 77 %   Neutro Abs 4.0  1.7 - 7.7 K/uL   Lymphocytes Relative 29  12 - 46 %   Lymphs Abs 1.9  0.7 - 4.0 K/uL   Monocytes Relative 8  3 - 12 %   Monocytes Absolute 0.5  0.1 - 1.0 K/uL   Eosinophils Relative 1  0 - 5 %   Eosinophils Absolute 0.1  0.0 - 0.7 K/uL   Basophils Relative 1  0 - 1 %   Basophils Absolute 0.0  0.0 - 0.1 K/uL  COMPREHENSIVE METABOLIC PANEL      Result Value Range   Sodium 141  135 - 145 mEq/L   Potassium 3.4 (*) 3.5 - 5.1 mEq/L   Chloride 102  96 - 112 mEq/L   CO2 27  19 - 32 mEq/L   Glucose, Bld 119 (*) 70 - 99 mg/dL   BUN 17  6 - 23 mg/dL   Creatinine, Ser 4.09  0.50 - 1.10 mg/dL   Calcium 9.6  8.4 - 81.1 mg/dL   Total Protein 7.4  6.0 - 8.3 g/dL   Albumin 3.9  3.5 - 5.2 g/dL   AST 25  0 - 37 U/L   ALT 30  0 - 35 U/L   Alkaline Phosphatase 66  39 - 117 U/L   Total Bilirubin 0.3  0.3 - 1.2 mg/dL   GFR calc non Af Amer 81 (*) >90 mL/min   GFR calc Af Amer >90  >90 mL/min  TROPONIN I      Result Value Range   Troponin I <0.30  <0.30 ng/mL  URINALYSIS W MICROSCOPIC + REFLEX CULTURE      Result Value Range   Color, Urine YELLOW  YELLOW   APPearance CLEAR  CLEAR   Specific Gravity, Urine 1.004 (*) 1.005 - 1.030   pH 6.5  5.0 - 8.0   Glucose, UA NEGATIVE  NEGATIVE mg/dL   Hgb urine dipstick NEGATIVE  NEGATIVE   Bilirubin Urine NEGATIVE  NEGATIVE   Ketones, ur NEGATIVE  NEGATIVE mg/dL   Protein, ur NEGATIVE  NEGATIVE mg/dL   Urobilinogen, UA 0.2  0.0 - 1.0 mg/dL   Nitrite  NEGATIVE  NEGATIVE   Leukocytes, UA TRACE (*) NEGATIVE   WBC, UA 0-2  <3 WBC/hpf   RBC / HPF 0-2  <3 RBC/hpf   Bacteria, UA FEW (*) RARE   Mr Brain Wo Contrast 06/20/2013   *RADIOLOGY REPORT*  Clinical Data: Episode of right-sided weakness.  MRI HEAD WITHOUT CONTRAST  Technique:  Multiplanar, multiecho pulse sequences of the brain and surrounding structures were obtained according to standard protocol without intravenous contrast.  Comparison: CT head without contrast 12/04/2011.  Findings: The diffusion weighted images demonstrate no evidence for acute or subacute infarction.  Moderate generalized atrophy is similar to the prior study.  Mild periventricular and subcortical white matter changes are noted bilaterally.  The ventricles are proportionate to the degree of atrophy.  No significant extra-axial fluid collection is present.  Flow is present to in the major intracranial arteries.  Patient is status post bilateral lens extractions.  Chronic right sphenoid sinus disease is noted.  The paranasal sinuses are otherwise clear.  Mild fluid is present to in the mastoid air  cells bilaterally, right greater than left.  IMPRESSION:  1.  No acute intracranial abnormality. 2.  Stable atrophy and white matter disease. 3.  Chronic right sphenoid sinus disease.   Original Report Authenticated By: Marin Roberts, M.D.     1100:  No acute stroke on MRI; appears TIA at this time. Dx and testing d/w pt and family.  Questions answered.  Verb understanding, agreeable to observation admit.  T/C to [facility] Dr. Zenaida Niece, case discussed, including:  HPI, pertinent PM/SHx, VS/PE, dx testing, ED course and treatment:  Agreeable to observation admit, requests to write temporary orders, obtain tele bed to Dr. Kevan Ny service.          Laray Anger, DO 06/22/13 2117

## 2013-06-20 NOTE — ED Notes (Signed)
CBG 124 

## 2013-06-20 NOTE — ED Notes (Signed)
Patient transported to MRI 

## 2013-06-20 NOTE — Evaluation (Signed)
Physical Therapy Evaluation Patient Details Name: Courtney Shea MRN: [id number] DOB: 1930-06-28 Today's Date: 06/20/2013 Time: 5784-6962 PT Time Calculation (min): 23 min  PT Assessment / Plan / Recommendation History of Present Illness  Pt had episode of right arm numbess and poor coordination this morning. Feels it has resolved at this point  Clinical Impression  Pt mod I with mobility, ambulated 500' with normal gait and speed, ascended and descended full flight of stairs. Strength 5/5 bilaterally and no coordination deficits noted. No further acute or f/u PT needs identified. PT signing off.    PT Assessment  Patent does not need any further PT services    Follow Up Recommendations  No PT follow up    Does the patient have the potential to tolerate intense rehabilitation      Barriers to Discharge        Equipment Recommendations  None recommended by PT    Recommendations for Other Services     Frequency      Precautions / Restrictions Precautions Precautions: None Restrictions Weight Bearing Restrictions: No   Pertinent Vitals/Pain No pain, VSS      Mobility  Bed Mobility Bed Mobility: Supine to Sit;Sit to Supine Supine to Sit: 7: Independent Sit to Supine: 7: Independent Transfers Transfers: Sit to Stand;Stand to Sit Sit to Stand: 7: Independent Stand to Sit: 7: Independent Ambulation/Gait Ambulation/Gait Assistance: 6: Modified independent (Device/Increase time) Ambulation Distance (Feet): 500 Feet Assistive device: None Ambulation/Gait Assistance Details: no gait deviations, no overt balance deficits noted today Gait Pattern: Within Functional Limits Gait velocity: WFL Stairs: Yes Stairs Assistance: 6: Modified independent (Device/Increase time) Stair Management Technique: One rail Right;Alternating pattern;Forwards Number of Stairs: 12 Wheelchair Mobility Wheelchair Mobility: No Modified Rankin (Stroke Patients Only) Pre-Morbid Rankin Score:  No symptoms Modified Rankin: No symptoms    Exercises     PT Diagnosis:    PT Problem List:   PT Treatment Interventions:       PT Goals(Current goals can be found in the care plan section) Acute Rehab PT Goals Patient Stated Goal: return home PT Goal Formulation: No goals set, d/c therapy  Visit Information  Last PT Received On: 06/20/13 Assistance Needed: +1 History of Present Illness: Pt had episode of right arm numbess and poor coordination this morning. Feels it has resolved at this point       Prior Functioning  Home Living Family/patient expects to be discharged to:: Private residence Living Arrangements: Spouse/significant other Available Help at Discharge: Available 24 hours/day Type of Home: House Home Layout: Two level;Laundry or work area in basement Alternate Teacher, music of Steps: flight Alternate Level Stairs-Rails: Right Home Equipment: None Additional Comments: pt and husband care for her great-grandchildren periodically, both drive, recently retired. Pt mentions mild balance deficit that has begun past couple of years but no recent falls. Prior Function Level of Independence: Independent Communication Communication: No difficulties Dominant Hand: Right    Cognition  Cognition Arousal/Alertness: Awake/alert Behavior During Therapy: WFL for tasks assessed/performed Overall Cognitive Status: Within Functional Limits for tasks assessed    Extremity/Trunk Assessment Upper Extremity Assessment Upper Extremity Assessment: Overall WFL for tasks assessed Lower Extremity Assessment Lower Extremity Assessment: Overall WFL for tasks assessed Cervical / Trunk Assessment Cervical / Trunk Assessment: Normal   Balance Balance Balance Assessed: Yes Dynamic Standing Balance Dynamic Standing - Balance Support: No upper extremity supported;During functional activity Dynamic Standing - Level of Assistance: 6: Modified independent (Device/Increase time)  End  of Session PT - End  of Session Activity Tolerance: Patient tolerated treatment well Patient left: in bed;with call bell/phone within reach;with family/visitor present Nurse Communication: Mobility status  GP Functional Assessment Tool Used: clinical judgement Functional Limitation: Mobility: Walking and moving around Mobility: Walking and Moving Around Current Status (W0981): 0 percent impaired, limited or restricted Mobility: Walking and Moving Around Goal Status (X9147): 0 percent impaired, limited or restricted Mobility: Walking and Moving Around Discharge Status [phone]): 0 percent impaired, limited or restricted  Lyanne Co, PT  Acute Rehab Services  [phone]  [place], [place] 06/20/2013, 2:25 PM

## 2013-06-20 NOTE — H&P (Signed)
[facility] History and Physical  RECHY BOST ZOX:[id number] DOB: 1929/12/31 DOA: 06/20/2013  Referring physician: er PCP: Pearla Dubonnet, MD  Specialists: none  Chief Complaint: TIA  HPI: Courtney Shea is a 79 y.o. female  Who was getting up early this AM to babysit her great-grandchildren.  While eating her breakfast, she was not able to use her right arm.  She also felt "funny".  Husband was able to move her to the couch.  Per patient she did not think she had trouble with her legs but can not remember.  Symptoms began about 6:30 but had resolved by the time she arrived in the ER.  Has been in good health, no PC, no dysuria, no fever, no chills.    In the ER, an MRI was done that showed no CVA.  Hospitalist were asked to obs for TIA work up.       Review of Systems: all systems reviewed, negative unless stated above   Past Medical History  Diagnosis Date  . Hypertension   . Hypothyroid   . GERD (gastroesophageal reflux disease)   . Syncope   . Ventricular bigeminy    Past Surgical History  Procedure Laterality Date  . Abdominal hysterectomy    . Ovarian cyst removed    . Incontinence surgery     Social History:  reports that she has never smoked. She has never used smokeless tobacco. She reports that she does not drink alcohol or use illicit drugs.   Allergies  Allergen Reactions  . Sulfa Antibiotics Nausea And Vomiting    Family History  Problem Relation Age of Onset  . Coronary artery disease Mother     MI at age 21    Prior to Admission medications   Medication Sig Start Date End Date Taking? Authorizing Provider  amLODipine (NORVASC) 5 MG tablet Take 5 mg by mouth daily.   Yes Historical Provider, MD  aspirin 81 MG tablet Take 162 mg by mouth daily.    Yes Historical Provider, MD  Calcium Carbonate-Vitamin D (CALCIUM 600 + D PO) Take 1 tablet by mouth daily.   Yes Historical Provider, MD  Cholecalciferol 1000 UNITS capsule Take 2,000 Units  by mouth daily.    Yes Historical Provider, MD  estrogens, conjugated, (PREMARIN) 0.3 MG tablet Take 0.3 mg by mouth daily. Take daily for 21 days then do not take for 7 days.   Yes Historical Provider, MD  furosemide (LASIX) 20 MG tablet Take 20 mg by mouth See admin instructions. Mon. Wed, Friday   Yes Historical Provider, MD  levothyroxine (SYNTHROID, LEVOTHROID) 88 MCG tablet Take 88 mcg by mouth daily.   Yes Historical Provider, MD  losartan (COZAAR) 100 MG tablet Take 100 mg by mouth daily.   Yes Historical Provider, MD  metoprolol succinate (TOPROL-XL) 50 MG 24 hr tablet Take 50 mg by mouth daily. Take with or immediately following a meal.   Yes Historical Provider, MD  omeprazole (PRILOSEC) 20 MG capsule Take 20 mg by mouth See admin instructions. Monday, Wednesday and Friday   Yes Historical Provider, MD  sennosides-docusate sodium (SENOKOT-S) 8.6-50 MG tablet Take 1 tablet by mouth daily as needed for constipation.    Yes Historical Provider, MD   Physical Exam: Filed Vitals:   06/20/13 1115  BP: 144/66  Pulse: 60  Temp:   Resp:      General:  A+Ox3, NAD  Eyes: wnl  ENT: wnl  Neck: supple  Cardiovascular: rrr  Respiratory: clear  anterior  Abdomen: +BS, soft, NT  Skin: no rashes  Musculoskeletal: no focal deficit  Psychiatric: normal  Neurologic: CN 2-12 intact  Labs on Admission:  Basic Metabolic Panel:  Recent Labs Lab 06/20/13 0830  NA 141  K 3.4*  CL 102  CO2 27  GLUCOSE 119*  BUN 17  CREATININE 0.67  CALCIUM 9.6   Liver Function Tests:  Recent Labs Lab 06/20/13 0830  AST 25  ALT 30  ALKPHOS 66  BILITOT 0.3  PROT 7.4  ALBUMIN 3.9   No results found for this basename: LIPASE, AMYLASE,  in the last 168 hours No results found for this basename: AMMONIA,  in the last 168 hours CBC:  Recent Labs Lab 06/20/13 0830  WBC 6.6  NEUTROABS 4.0  HGB 13.9  HCT 40.1  MCV 91.1  PLT 219   Cardiac Enzymes:  Recent Labs Lab 06/20/13 0825   TROPONINI <0.30    BNP (last 3 results) No results found for this basename: PROBNP,  in the last 8760 hours CBG:  Recent Labs Lab 06/20/13 0746  GLUCAP 124*    Radiological Exams on Admission: Mr Brain Wo Contrast  06/20/2013   *RADIOLOGY REPORT*  Clinical Data: Episode of right-sided weakness.  MRI HEAD WITHOUT CONTRAST  Technique:  Multiplanar, multiecho pulse sequences of the brain and surrounding structures were obtained according to standard protocol without intravenous contrast.  Comparison: CT head without contrast 12/04/2011.  Findings: The diffusion weighted images demonstrate no evidence for acute or subacute infarction.  Moderate generalized atrophy is similar to the prior study.  Mild periventricular and subcortical white matter changes are noted bilaterally.  The ventricles are proportionate to the degree of atrophy.  No significant extra-axial fluid collection is present.  Flow is present to in the major intracranial arteries.  Patient is status post bilateral lens extractions.  Chronic right sphenoid sinus disease is noted.  The paranasal sinuses are otherwise clear.  Mild fluid is present to in the mastoid air cells bilaterally, right greater than left.  IMPRESSION:  1.  No acute intracranial abnormality. 2.  Stable atrophy and white matter disease. 3.  Chronic right sphenoid sinus disease.   Original Report Authenticated By: Marin Roberts, M.D.      Assessment/Plan Active Problems:   Hypothyroidism   TIA (transient ischemic attack)   Hypokalemia   1. TIA: order set initiated, MRI neg, echo, carotids, HgbA1C, FLP 2. Hypokalemia- replete 3. Hypothyroid: check TSH  Dr. Kevan Ny to assumes care in the AM  Code Status: full Family Communication: patient Disposition Plan: ob  Time spent: 70 min  Courtney Shea, Courtney Shea Pager [phone]  If 7PM-7AM, please contact night-coverage [email or web address] Password [facility] 06/20/2013, 12:53 PM

## 2013-06-20 NOTE — ED Notes (Signed)
Pt arrives to ed co right sided upper and lower extremitity weakness-sudden onset at 0645. Weakness resolved after 10 min per husband. Pt denies weakness at this time. Speech clear, caox4, nad, no facial droop.  Pt denies changes in vision and syncopy.  Pt denies recent illness/injury. No other complaints at this time.

## 2013-06-21 DIAGNOSIS — G459 Transient cerebral ischemic attack, unspecified: Secondary | ICD-10-CM | POA: Diagnosis not present

## 2013-06-21 DIAGNOSIS — K219 Gastro-esophageal reflux disease without esophagitis: Secondary | ICD-10-CM | POA: Diagnosis not present

## 2013-06-21 LAB — T4, FREE: Free T4: 1.29 ng/dL (ref 0.80–1.80)

## 2013-06-21 LAB — LIPID PANEL
HDL: 44 mg/dL (ref >39)
Total CHOL/HDL Ratio: 4.1 RATIO
Triglycerides: 158 mg/dL — ABNORMAL HIGH (ref <150)

## 2013-06-21 LAB — TSH: TSH: 8.324 u[IU]/mL — ABNORMAL HIGH (ref 0.350–4.500)

## 2013-06-21 MED ORDER — METOPROLOL TARTRATE 25 MG PO TABS
25.0000 mg | ORAL_TABLET | Freq: Once | ORAL | Status: AC
  Administered 2013-06-21: 25 mg via ORAL
  Filled 2013-06-21: qty 1

## 2013-06-21 MED ORDER — ASPIRIN 81 MG PO TABS: 81.0000 mg | ORAL_TABLET | Freq: Every day | ORAL | Status: DC

## 2013-06-21 MED ORDER — CLOPIDOGREL BISULFATE 75 MG PO TABS: 75.0000 mg | ORAL_TABLET | Freq: Every day | ORAL | Status: DC

## 2013-06-21 MED ORDER — PANTOPRAZOLE SODIUM 40 MG PO TBEC: 40.0000 mg | DELAYED_RELEASE_TABLET | Freq: Every day | ORAL | Status: DC

## 2013-06-21 MED ORDER — CLOPIDOGREL BISULFATE 75 MG PO TABS
75.0000 mg | ORAL_TABLET | Freq: Every day | ORAL | Status: DC
  Administered 2013-06-21: 75 mg via ORAL
  Filled 2013-06-21: qty 1

## 2013-06-21 NOTE — Evaluation (Signed)
Occupational Therapy Evaluation Patient Details Name: Courtney Shea MRN: [id number] DOB: 09-19-1930 Today's Date: 06/21/2013 Time: 4098-1191 OT Time Calculation (min): 26 min  OT Assessment / Plan / Recommendation History of present illness Pt had episode of right arm numbess and poor coordination this morning. Feels it has resolved at this point   Clinical Impression   Patient evaluated by Occupational Therapy with no further acute OT needs identified. All education has been completed and the patient has no further questions. See below for any follow-up Occupational Therapy or equipment needs. OT to sign off. Thank you for referral.      OT Assessment  Patient does not need any further OT services    Follow Up Recommendations  No OT follow up    Barriers to Discharge      Equipment Recommendations  None recommended by OT    Recommendations for Other Services    Frequency       Precautions / Restrictions Precautions Precautions: None   Pertinent Vitals/Pain none    ADL  Eating/Feeding: Independent Where Assessed - Eating/Feeding: Chair Grooming: Wash/dry hands;Wash/dry face;Teeth care;Brushing hair;Independent Where Assessed - Grooming: Unsupported standing Upper Body Bathing: Chest;Right arm;Left arm;Abdomen;Independent Where Assessed - Upper Body Bathing: Unsupported sit to stand Lower Body Bathing: Independent Where Assessed - Lower Body Bathing: Unsupported sit to stand Upper Body Dressing: Independent Where Assessed - Upper Body Dressing: Unsupported sit to stand Lower Body Dressing: Independent Where Assessed - Lower Body Dressing: Unsupported sit to stand Toilet Transfer: Independent Toilet Transfer Method: Sit to stand Toilet Transfer Equipment: Regular height toilet Transfers/Ambulation Related to ADLs: Pt independent ADL Comments: pt completed full ADL at sink level and dressed for d/c home. pt with no visual or fine motor deficits. Pt is at or near  baseline.    OT Diagnosis:    OT Problem List:   OT Treatment Interventions:     OT Goals(Current goals can be found in the care plan section) Acute Rehab OT Goals Patient Stated Goal: return home  Visit Information  Last OT Received On: 06/21/13 Assistance Needed: +1 History of Present Illness: Pt had episode of right arm numbess and poor coordination this morning. Feels it has resolved at this point       Prior Functioning     Home Living Family/patient expects to be discharged to:: Private residence Living Arrangements: Spouse/significant other Available Help at Discharge: Available 24 hours/day Type of Home: House Home Layout: Two level;Laundry or work area in basement Alternate Teacher, music of Steps: flight Alternate Level Stairs-Rails: Right Home Equipment: None Additional Comments: pt and husband care for her great-grandchildren periodically, both drive, recently retired. Pt mentions mild balance deficit that has begun past couple of years but no recent falls. Prior Function Level of Independence: Independent Communication Communication: No difficulties Dominant Hand: Right         Vision/Perception Vision - History Baseline Vision: No visual deficits Patient Visual Report: No change from baseline   Cognition  Cognition Arousal/Alertness: Awake/alert Behavior During Therapy: WFL for tasks assessed/performed Overall Cognitive Status: Within Functional Limits for tasks assessed    Extremity/Trunk Assessment Upper Extremity Assessment Upper Extremity Assessment: Overall WFL for tasks assessed Lower Extremity Assessment Lower Extremity Assessment: Defer to PT evaluation Cervical / Trunk Assessment Cervical / Trunk Assessment: Normal     Mobility Bed Mobility Supine to Sit: 7: Independent Sit to Supine: 7: Independent Transfers Sit to Stand: 7: Independent Stand to Sit: 7: Independent Details for Transfer Assistance: Rn notified  patient is  free to ambulate in room and unit. Pt is ready for d/c     Exercise     Balance High Level Balance High Level Balance Comments: Pt static standing on one leg applying lotion to foot. Pt completed single leg standing for > 30 seconds.    End of Session OT - End of Session Activity Tolerance: Patient tolerated treatment well Patient left: in chair;with call bell/phone within reach Nurse Communication: Mobility status  GO Functional Assessment Tool Used: clinical judgement Functional Limitation: Self care Self Care Current Status (W1191): 0 percent impaired, limited or restricted Self Care Goal Status (Y7829): 0 percent impaired, limited or restricted Self Care Discharge Status [phone]): 0 percent impaired, limited or restricted   Lucile Shutters 06/21/2013, 11:52 AM Pager: [phone]

## 2013-06-21 NOTE — Discharge Summary (Signed)
Physician Discharge Summary  NAME:Courtney Shea  JYN:[id number]  DOB: 04/15/1930   Admit date: 06/20/2013 Discharge date: 06/21/2013  Discharge Diagnoses:  Principal Problem:   TIA (transient ischemic attack) - symptomatically resolved with normal carotid Dopplers and echocardiogram without focus of emboli and no major intracranial stenoses.  We'll be starting Plavix and continue aspirin Active Problems:   Hypothyroidism   Hypokalemia   Hypertension   GERD   Ventricular bigeminy - aware, will plan for furthercardiac monitoring as an outpatient   History of syncope   Estrogen replacement therapy - discontinued secondary to promoting a possible hypercoagulable state   Discharge Physical Exam:  General Appearance: Alert, cooperative, no distress, appears stated age  Weight change:   Intake/Output Summary (Last 24 hours) at 06/21/13 0728 Last data filed at 06/20/13 1900  Gross per 24 hour  Intake    240 ml  Output      0 ml  Net    240 ml   Filed Vitals:   06/20/13 1900 06/20/13 2124 06/20/13 2300 06/21/13 0519  BP: 121/78 123/74  121/54  Pulse: 97 71  91  Temp: 98.1 F (36.7 C) 97.6 F (36.4 C)  98.3 F (36.8 C)  TempSrc: Oral Oral  Oral  Resp: 18 18  20   Height:   5\' 3"  (1.6 m)   Weight:   77.111 kg (170 lb)   SpO2: 97% 97%  98%    Lungs: Clear to auscultation bilaterally, respirations unlabored Heart: Regular rate and rhythm, rate slightly elevated, but no beta blocker therapy as yet today.S1 and S2 normal, no murmur, rub or gallop Abdomen: Soft, non-tender, bowel sounds active all four quadrants, no masses, no organomegaly Extremities: Extremities normal, atraumatic, no cyanosis or edema Neuro: oriented x3, cranial nerves grossly intact.  Nonfocal exam  Discharge Condition: Improved - right arm weakness resolved without recurrence  Hospital Course: Courtney Shea is a very pleasant 79 year old female who experienced right arm weakness and inability to walk  for 10-15 minutes yesterday.  She had a syncopal episode last year and had head trauma from that.  She has been feeling well until this episode yesterday morning since that time.  Workup has not revealed any significant carotid stenosis.  MRI of the brain revealed no stroke an echocardiogram reveals no obvious focus of emboli.  She is on estrogen therapy which we will discontinue and we will add Plavix to her regimen.  Will switch from omeprazole to Protonix for PPI therapy  Things to follow up in the outpatient setting: Recurrent symptoms of weakness or lightheadedness or dizziness and will plan for cardiac monitoring and followup in the office in one week  Disposition: 01-Home or Self Care  Discharge Orders   Future Orders Complete By Expires   Call MD for:  difficulty breathing, headache or visual disturbances  As directed    Call MD for:  persistant dizziness or light-headedness  As directed    Call MD for:  persistant dizziness or light-headedness  As directed    Call MD for:  temperature >100.4  As directed    Call MD for:  temperature >100.4  As directed    Diet - low sodium heart healthy  As directed    Diet - low sodium heart healthy  As directed    Discharge instructions  As directed    Comments:     Discontinue omeprazole and start pantoprazole/Protonix for GERD.  Protonix works better with Plavix/clopidogrel which is been started to help  prevent stroke.  Dose of aspirin should be a baby aspirin along with the Plavix/clopidogrel. Please call M.D. If recurrent weakness occurs in the extremities or severe lightheadedness or dizziness reoccurs   Increase activity slowly  As directed    Increase activity slowly  As directed        Medication List    STOP taking these medications       estrogens (conjugated) 0.3 MG tablet  Commonly known as:  PREMARIN     omeprazole 20 MG capsule  Commonly known as:  PRILOSEC  Replaced by:  pantoprazole 40 MG tablet      TAKE these  medications       amLODipine 5 MG tablet  Commonly known as:  NORVASC  Take 5 mg by mouth daily.     aspirin 81 MG tablet  Take 162 mg by mouth daily.     CALCIUM 600 + D PO  Take 1 tablet by mouth daily.     Cholecalciferol 1000 UNITS capsule  Take 2,000 Units by mouth daily.     clopidogrel 75 MG tablet  Commonly known as:  PLAVIX  Take 1 tablet (75 mg total) by mouth daily with breakfast.     furosemide 20 MG tablet  Commonly known as:  LASIX  Take 20 mg by mouth See admin instructions. Mon. Wed, Friday     levothyroxine 88 MCG tablet  Commonly known as:  SYNTHROID, LEVOTHROID  Take 88 mcg by mouth daily.     losartan 100 MG tablet  Commonly known as:  COZAAR  Take 100 mg by mouth daily.     metoprolol succinate 50 MG 24 hr tablet  Commonly known as:  TOPROL-XL  Take 50 mg by mouth daily. Take with or immediately following a meal.     pantoprazole 40 MG tablet  Commonly known as:  PROTONIX  Take 1 tablet (40 mg total) by mouth daily.     sennosides-docusate sodium 8.6-50 MG tablet  Commonly known as:  SENOKOT-S  Take 1 tablet by mouth daily as needed for constipation.         The results of significant diagnostics from this hospitalization (including imaging, microbiology, ancillary and laboratory) are listed below for reference.    Significant Diagnostic Studies: Mr Courtney Shea Contrast  06/20/2013   *RADIOLOGY REPORT*  Clinical Data: Episode of right-sided weakness.  MRI HEAD WITHOUT CONTRAST  Technique:  Multiplanar, multiecho pulse sequences of the brain and surrounding structures were obtained according to standard protocol without intravenous contrast.  Comparison: CT head without contrast 12/04/2011.  Findings: The diffusion weighted images demonstrate no evidence for acute or subacute infarction.  Moderate generalized atrophy is similar to the prior study.  Mild periventricular and subcortical white matter changes are noted bilaterally.  The ventricles are  proportionate to the degree of atrophy.  No significant extra-axial fluid collection is present.  Flow is present to in the major intracranial arteries.  Patient is status post bilateral lens extractions.  Chronic right sphenoid sinus disease is noted.  The paranasal sinuses are otherwise clear.  Mild fluid is present to in the mastoid air cells bilaterally, right greater than left.  IMPRESSION:  1.  No acute intracranial abnormality. 2.  Stable atrophy and white matter disease. 3.  Chronic right sphenoid sinus disease.   Original Report Authenticated By: Marin Roberts, M.D.    Microbiology: Recent Results (from the past 240 hour(s))  URINE CULTURE     Status: None   Collection  Time    06/20/13  9:10 AM      Result Value Range Status   Specimen Description URINE, CLEAN CATCH   Final   Special Requests CX ADDED AT 1121 ON [id number]   Final   Culture  Setup Time     Final   Value: 06/20/2013 15:12     Performed at [facility] Count     Final   Value: >=100,000 COLONIES/ML     Performed at [facility]   Culture     Final   Value: ESCHERICHIA COLI     Performed at [facility]   Report Status PENDING   Incomplete     Labs: Results for orders placed during the hospital encounter of 06/20/13  URINE CULTURE      Result Value Range   Specimen Description URINE, CLEAN CATCH     Special Requests CX ADDED AT 1121 ON [id number]     Culture  Setup Time       Value: 06/20/2013 15:12     Performed at [facility] Count       Value: >=100,000 COLONIES/ML     Performed at [facility]   Culture       Value: ESCHERICHIA COLI     Performed at [facility]   Report Status PENDING    GLUCOSE, CAPILLARY      Result Value Range   Glucose-Capillary 124 (*) 70 - 99 mg/dL   Comment 1 Documented in Chart     Comment 2 Notify RN    ETHANOL      Result Value Range   Alcohol, Ethyl (B) <11  0 - 11 mg/dL  PROTIME-INR      Result  Value Range   Prothrombin Time 12.1  11.6 - 15.2 seconds   INR 0.91  0.00 - 1.49  APTT      Result Value Range   aPTT 27  24 - 37 seconds  CBC      Result Value Range   WBC 6.6  4.0 - 10.5 K/uL   RBC 4.40  3.87 - 5.11 MIL/uL   Hemoglobin 13.9  12.0 - 15.0 g/dL   HCT 95.6  21.3 - 08.6 %   MCV 91.1  78.0 - 100.0 fL   MCH 31.6  26.0 - 34.0 pg   MCHC 34.7  30.0 - 36.0 g/dL   RDW 57.8  46.9 - 62.9 %   Platelets 219  150 - 400 K/uL  DIFFERENTIAL      Result Value Range   Neutrophils Relative % 62  43 - 77 %   Neutro Abs 4.0  1.7 - 7.7 K/uL   Lymphocytes Relative 29  12 - 46 %   Lymphs Abs 1.9  0.7 - 4.0 K/uL   Monocytes Relative 8  3 - 12 %   Monocytes Absolute 0.5  0.1 - 1.0 K/uL   Eosinophils Relative 1  0 - 5 %   Eosinophils Absolute 0.1  0.0 - 0.7 K/uL   Basophils Relative 1  0 - 1 %   Basophils Absolute 0.0  0.0 - 0.1 K/uL  COMPREHENSIVE METABOLIC PANEL      Result Value Range   Sodium 141  135 - 145 mEq/L   Potassium 3.4 (*) 3.5 - 5.1 mEq/L   Chloride 102  96 - 112 mEq/L   CO2 27  19 - 32 mEq/L   Glucose, Bld 119 (*)  70 - 99 mg/dL   BUN 17  6 - 23 mg/dL   Creatinine, Ser 1.61  0.50 - 1.10 mg/dL   Calcium 9.6  8.4 - 09.6 mg/dL   Total Protein 7.4  6.0 - 8.3 g/dL   Albumin 3.9  3.5 - 5.2 g/dL   AST 25  0 - 37 U/L   ALT 30  0 - 35 U/L   Alkaline Phosphatase 66  39 - 117 U/L   Total Bilirubin 0.3  0.3 - 1.2 mg/dL   GFR calc non Af Amer 81 (*) >90 mL/min   GFR calc Af Amer >90  >90 mL/min  TROPONIN I      Result Value Range   Troponin I <0.30  <0.30 ng/mL  URINALYSIS W MICROSCOPIC + REFLEX CULTURE      Result Value Range   Color, Urine YELLOW  YELLOW   APPearance CLEAR  CLEAR   Specific Gravity, Urine 1.004 (*) 1.005 - 1.030   pH 6.5  5.0 - 8.0   Glucose, UA NEGATIVE  NEGATIVE mg/dL   Hgb urine dipstick NEGATIVE  NEGATIVE   Bilirubin Urine NEGATIVE  NEGATIVE   Ketones, ur NEGATIVE  NEGATIVE mg/dL   Protein, ur NEGATIVE  NEGATIVE mg/dL   Urobilinogen, UA 0.2   0.0 - 1.0 mg/dL   Nitrite NEGATIVE  NEGATIVE   Leukocytes, UA TRACE (*) NEGATIVE   WBC, UA 0-2  <3 WBC/hpf   RBC / HPF 0-2  <3 RBC/hpf   Bacteria, UA FEW (*) RARE  HEMOGLOBIN A1C      Result Value Range   Hemoglobin A1C 5.9 (*) <5.7 %   Mean Plasma Glucose 123 (*) <117 mg/dL  URINE RAPID DRUG SCREEN (HOSP PERFORMED)      Result Value Range   Opiates NONE DETECTED  NONE DETECTED   Cocaine NONE DETECTED  NONE DETECTED   Benzodiazepines NONE DETECTED  NONE DETECTED   Amphetamines NONE DETECTED  NONE DETECTED   Tetrahydrocannabinol NONE DETECTED  NONE DETECTED   Barbiturates NONE DETECTED  NONE DETECTED  LIPID PANEL      Result Value Range   Cholesterol 181  0 - 200 mg/dL   Triglycerides 045 (*) <150 mg/dL   HDL 44  >40 mg/dL   Total CHOL/HDL Ratio 4.1     VLDL 32  0 - 40 mg/dL   LDL Cholesterol 981 (*) 0 - 99 mg/dL  GLUCOSE, CAPILLARY      Result Value Range   Glucose-Capillary 121 (*) 70 - 99 mg/dL   Comment 1 Documented in Chart     Comment 2 Notify RN      Time coordinating discharge: 35 minutes  Signed: Pearla Dubonnet, MD 06/21/2013, 7:28 AM

## 2013-06-21 NOTE — Progress Notes (Signed)
Discharge instructions given. Patient verbalized understanding and all questions were answered.  ?

## 2013-06-22 LAB — URINE CULTURE

## 2013-06-25 ENCOUNTER — Emergency Department (HOSPITAL_COMMUNITY): Payer: Medicare Other

## 2013-06-25 ENCOUNTER — Encounter (HOSPITAL_COMMUNITY): Payer: Self-pay | Admitting: Nurse Practitioner

## 2013-06-25 ENCOUNTER — Emergency Department (HOSPITAL_COMMUNITY)
Admission: EM | Admit: 2013-06-25 | Discharge: 2013-06-25 | Disposition: A | Discharge status: Home / Self Care | Payer: Medicare Other | Attending: Emergency Medicine | Admitting: Emergency Medicine

## 2013-06-25 DIAGNOSIS — K219 Gastro-esophageal reflux disease without esophagitis: Secondary | ICD-10-CM | POA: Insufficient documentation

## 2013-06-25 DIAGNOSIS — Z8679 Personal history of other diseases of the circulatory system: Secondary | ICD-10-CM | POA: Insufficient documentation

## 2013-06-25 DIAGNOSIS — M6281 Muscle weakness (generalized): Secondary | ICD-10-CM | POA: Diagnosis not present

## 2013-06-25 DIAGNOSIS — R5381 Other malaise: Secondary | ICD-10-CM | POA: Diagnosis not present

## 2013-06-25 DIAGNOSIS — I10 Essential (primary) hypertension: Secondary | ICD-10-CM | POA: Insufficient documentation

## 2013-06-25 DIAGNOSIS — Z79899 Other long term (current) drug therapy: Secondary | ICD-10-CM | POA: Diagnosis not present

## 2013-06-25 DIAGNOSIS — Z8673 Personal history of transient ischemic attack (TIA), and cerebral infarction without residual deficits: Secondary | ICD-10-CM | POA: Insufficient documentation

## 2013-06-25 DIAGNOSIS — Z7902 Long term (current) use of antithrombotics/antiplatelets: Secondary | ICD-10-CM | POA: Insufficient documentation

## 2013-06-25 DIAGNOSIS — R29898 Other symptoms and signs involving the musculoskeletal system: Secondary | ICD-10-CM

## 2013-06-25 DIAGNOSIS — E039 Hypothyroidism, unspecified: Secondary | ICD-10-CM | POA: Diagnosis not present

## 2013-06-25 HISTORY — DX: Cerebral infarction, unspecified: I63.9

## 2013-06-25 LAB — GLUCOSE, CAPILLARY: Glucose-Capillary: 105 mg/dL — ABNORMAL HIGH (ref 70–99)

## 2013-06-25 NOTE — ED Notes (Signed)
Pt woke this am with numbness to entire L arm. Reports numbness has remained constant since waking. Was just discharged Monday from [facility] for stroke. Denies any troubel speaking or swallowing, speech is clear, grips are = bilateral, she is able to grip and hold objects with her L arm. No residual from her stroke. A&Ox4, resp e/u

## 2013-06-25 NOTE — ED Provider Notes (Signed)
CSN: [id number]     Arrival date & time 06/25/13  1012 History   First MD Initiated Contact with Patient 06/25/13 1059     Chief Complaint  Patient presents with  . Numbness   (Consider location/radiation/quality/duration/timing/severity/associated sxs/prior Treatment) HPI Patient presents with dysesthesia throughout the left arm.  Symptoms began upon awakening, approximately 6 hours prior to my evaluation. Patient was in her usual state of health when she went to sleep last night. Since onset symptoms of the persistent.  There is dysesthesia in a throbbing sensation throughout the left upper arm.  There are no other new concerns, including no new lightheadedness, syncope, chest pain, dyspnea, fever, chills, confusion, disorientation, lower extremity changes. Patient has a notable recent history of evaluation here earlier this week for right-sided weakness.  That evaluation included MRI, echocardiogram, carotid Dopplers.  These results were largely unremarkable  and the patient was started on Plavix therapy.   Past Medical History  Diagnosis Date  . Hypertension   . Hypothyroid   . GERD (gastroesophageal reflux disease)   . Syncope   . Ventricular bigeminy   . Stroke    Past Surgical History  Procedure Laterality Date  . Abdominal hysterectomy    . Ovarian cyst removed    . Incontinence surgery     Family History  Problem Relation Age of Onset  . Coronary artery disease Mother     MI at age 63   History  Substance Use Topics  . Smoking status: Never Smoker   . Smokeless tobacco: Never Used  . Alcohol Use: No   OB History   Grav Para Term Preterm Abortions TAB SAB Ect Mult Living                 Review of Systems  Constitutional:       Per HPI, otherwise negative  HENT:       Per HPI, otherwise negative  Respiratory:       Per HPI, otherwise negative  Cardiovascular:       Per HPI, otherwise negative  Gastrointestinal: Negative for vomiting.  Endocrine:        Negative aside from HPI  Genitourinary:       Neg aside from HPI   Musculoskeletal:       Per HPI, otherwise negative  Skin: Negative.   Neurological: Positive for weakness and numbness. Negative for dizziness, tremors, seizures, syncope, facial asymmetry, speech difficulty, light-headedness and headaches.    Allergies  Codeine; Sulfa antibiotics; and Hydrochlorothiazide  Home Medications   Current Outpatient Rx  Name  Route  Sig  Dispense  Refill  . amLODipine (NORVASC) 5 MG tablet   Oral   Take 5 mg by mouth daily.         Marland Kitchen aspirin EC 81 MG tablet   Oral   Take 81 mg by mouth every evening.         . Calcium Carbonate-Vitamin D (CALCIUM 600 + D PO)   Oral   Take 1 tablet by mouth every evening.          . Cholecalciferol (VITAMIN D) 2000 UNITS tablet   Oral   Take 2,000 Units by mouth every evening.         . clopidogrel (PLAVIX) 75 MG tablet   Oral   Take 75 mg by mouth daily.         . furosemide (LASIX) 20 MG tablet   Oral   Take 20 mg by mouth See  admin instructions. Mon. Wed, Friday         . levothyroxine (SYNTHROID, LEVOTHROID) 88 MCG tablet   Oral   Take 88 mcg by mouth daily.         Marland Kitchen losartan (COZAAR) 100 MG tablet   Oral   Take 100 mg by mouth daily.         . metoprolol succinate (TOPROL-XL) 50 MG 24 hr tablet   Oral   Take 50 mg by mouth daily. Take with or immediately following a meal.         . pantoprazole (PROTONIX) 40 MG tablet   Oral   Take 40 mg by mouth every evening.         . sennosides-docusate sodium (SENOKOT-S) 8.6-50 MG tablet   Oral   Take 1 tablet by mouth daily as needed for constipation.           BP 139/82  Pulse 104  Temp(Src) 98.2 F (36.8 C) (Oral)  Resp 15  Ht 5\' 5"  (1.651 m)  Wt 170 lb (77.111 kg)  BMI 28.29 kg/m2  SpO2 97% Physical Exam  Nursing note and vitals reviewed. Constitutional: She is oriented to person, place, and time. She appears well-developed and well-nourished. No  distress.  HENT:  Head: Normocephalic and atraumatic.  Eyes: Conjunctivae and EOM are normal.  Cardiovascular: Normal rate and regular rhythm.   Pulmonary/Chest: Effort normal and breath sounds normal. No stridor. No respiratory distress.  Abdominal: She exhibits no distension.  Musculoskeletal: She exhibits no edema.  Neurological: She is alert and oriented to person, place, and time. She displays no atrophy and no tremor. No cranial nerve deficit or sensory deficit. She exhibits normal muscle tone. She displays no seizure activity. Coordination normal.  Patient is appropriate strength in the shoulder, elbow, wrist of the left side, with appropriate sensation throughout.  There is no asymmetry of the face, no speech difficulty, no coordination deficits  Skin: Skin is warm and dry.  Psychiatric: She has a normal mood and affect.    ED Course  Procedures (including critical care time) Labs Review Labs Reviewed  GLUCOSE, CAPILLARY - Abnormal; Notable for the following:    Glucose-Capillary 105 (*)    All other components within normal limits   Imaging Review Mr Brain Wo Contrast  06/25/2013   CLINICAL DATA:  79 year old female with recent admission for right side weakness. Now with left side symptoms.  EXAM: MRI HEAD WITHOUT CONTRAST  TECHNIQUE: Multiplanar, multisequence MR imaging was performed. No intravenous contrast was administered.  COMPARISON:  Brain MRI 06/20/2013.  FINDINGS: Stable cerebral volume. No restricted diffusion to suggest acute infarction. No midline shift, mass effect, evidence of mass lesion, ventriculomegaly, extra-axial collection or acute intracranial hemorrhage. Cervicomedullary junction and pituitary are within normal limits. Negative visualized cervical spine. Major intracranial vascular flow voids are stable. Stable gray and white matter signal throughout the brain. Mild for age nonspecific mostly subcortical white matter T2 and FLAIR hyperintensity. Widespread  prominent dilated perivascular spaces in the cerebral white matter. Deep gray matter nuclei brainstem and cerebellum within normal limits. Grossly normal visualized internal auditory structures. Normal bone marrow signal. Stable paranasal sinuses and mastoids. No acute orbit or scalp soft tissue finding.  IMPRESSION: No acute intracranial abnormality. Stable non contrast MRI appearance of the brain with mild for age nonspecific white matter signal changes.   Electronically Signed   By: Augusto Gamble   On: 06/25/2013 14:23   Pulse oximetry 99% room air normal  After the initial evaluation I reviewed the patient's chart, including recent MRI, hospitalization. MDM  No diagnosis found. This female presents several days after her recent evaluation for TIA, now with different symptoms.  On my exam she is neurologically intact, though she continues to have subjective complaints of her left upper arm.  Patient's evaluation here is largely reassuring, including MRI that a new focal findings.  Patient recently started Plavix, and absent any evidence of new neurologic dysfunction, was discharged in stable condition to continue his medication, follow up with neurology as an outpatient.    Gerhard Munch, MD 06/25/13 [phone]

## 2013-07-13 DIAGNOSIS — Z23 Encounter for immunization: Secondary | ICD-10-CM | POA: Diagnosis not present

## 2013-07-13 DIAGNOSIS — R197 Diarrhea, unspecified: Secondary | ICD-10-CM | POA: Diagnosis not present

## 2013-07-13 DIAGNOSIS — G459 Transient cerebral ischemic attack, unspecified: Secondary | ICD-10-CM | POA: Diagnosis not present

## 2013-07-20 DIAGNOSIS — G459 Transient cerebral ischemic attack, unspecified: Secondary | ICD-10-CM | POA: Diagnosis not present

## 2013-07-20 DIAGNOSIS — R197 Diarrhea, unspecified: Secondary | ICD-10-CM | POA: Diagnosis not present

## 2013-07-26 DIAGNOSIS — Z01419 Encounter for gynecological examination (general) (routine) without abnormal findings: Secondary | ICD-10-CM | POA: Diagnosis not present

## 2013-08-24 DIAGNOSIS — J069 Acute upper respiratory infection, unspecified: Secondary | ICD-10-CM | POA: Diagnosis not present

## 2013-10-13 DIAGNOSIS — I10 Essential (primary) hypertension: Secondary | ICD-10-CM | POA: Diagnosis not present

## 2013-10-13 DIAGNOSIS — E559 Vitamin D deficiency, unspecified: Secondary | ICD-10-CM | POA: Diagnosis not present

## 2013-10-13 DIAGNOSIS — G459 Transient cerebral ischemic attack, unspecified: Secondary | ICD-10-CM | POA: Diagnosis not present

## 2013-10-13 DIAGNOSIS — J069 Acute upper respiratory infection, unspecified: Secondary | ICD-10-CM | POA: Diagnosis not present

## 2013-10-13 DIAGNOSIS — K219 Gastro-esophageal reflux disease without esophagitis: Secondary | ICD-10-CM | POA: Diagnosis not present

## 2013-10-13 DIAGNOSIS — M1710 Unilateral primary osteoarthritis, unspecified knee: Secondary | ICD-10-CM | POA: Diagnosis not present

## 2013-10-13 DIAGNOSIS — J309 Allergic rhinitis, unspecified: Secondary | ICD-10-CM | POA: Diagnosis not present

## 2013-10-13 DIAGNOSIS — E039 Hypothyroidism, unspecified: Secondary | ICD-10-CM | POA: Diagnosis not present

## 2013-10-24 DIAGNOSIS — J019 Acute sinusitis, unspecified: Secondary | ICD-10-CM | POA: Diagnosis not present

## 2013-12-14 DIAGNOSIS — H1030 Unspecified acute conjunctivitis, unspecified eye: Secondary | ICD-10-CM | POA: Diagnosis not present

## 2013-12-21 DIAGNOSIS — J329 Chronic sinusitis, unspecified: Secondary | ICD-10-CM | POA: Diagnosis not present

## 2014-01-20 DIAGNOSIS — K219 Gastro-esophageal reflux disease without esophagitis: Secondary | ICD-10-CM | POA: Diagnosis not present

## 2014-01-20 DIAGNOSIS — I10 Essential (primary) hypertension: Secondary | ICD-10-CM | POA: Diagnosis not present

## 2014-01-20 DIAGNOSIS — IMO0002 Reserved for concepts with insufficient information to code with codable children: Secondary | ICD-10-CM | POA: Diagnosis not present

## 2014-01-20 DIAGNOSIS — J309 Allergic rhinitis, unspecified: Secondary | ICD-10-CM | POA: Diagnosis not present

## 2014-01-20 DIAGNOSIS — E039 Hypothyroidism, unspecified: Secondary | ICD-10-CM | POA: Diagnosis not present

## 2014-01-20 DIAGNOSIS — G459 Transient cerebral ischemic attack, unspecified: Secondary | ICD-10-CM | POA: Diagnosis not present

## 2014-01-20 DIAGNOSIS — M1710 Unilateral primary osteoarthritis, unspecified knee: Secondary | ICD-10-CM | POA: Diagnosis not present

## 2014-01-20 DIAGNOSIS — I499 Cardiac arrhythmia, unspecified: Secondary | ICD-10-CM | POA: Diagnosis not present

## 2014-01-20 DIAGNOSIS — E559 Vitamin D deficiency, unspecified: Secondary | ICD-10-CM | POA: Diagnosis not present

## 2014-03-31 ENCOUNTER — Other Ambulatory Visit: Payer: Self-pay | Admitting: Obstetrics & Gynecology

## 2014-03-31 DIAGNOSIS — N63 Unspecified lump in unspecified breast: Secondary | ICD-10-CM

## 2014-04-11 ENCOUNTER — Ambulatory Visit
Admission: RE | Admit: 2014-04-11 | Discharge: 2014-04-11 | Disposition: A | Discharge status: Home / Self Care | Payer: Medicare Other | Source: Ambulatory Visit | Attending: Obstetrics & Gynecology | Admitting: Obstetrics & Gynecology

## 2014-04-11 DIAGNOSIS — N63 Unspecified lump in unspecified breast: Secondary | ICD-10-CM

## 2014-05-12 DIAGNOSIS — Z0000 Encounter for general adult medical examination without abnormal findings: Secondary | ICD-10-CM | POA: Diagnosis not present

## 2014-05-12 DIAGNOSIS — I499 Cardiac arrhythmia, unspecified: Secondary | ICD-10-CM | POA: Diagnosis not present

## 2014-05-12 DIAGNOSIS — E559 Vitamin D deficiency, unspecified: Secondary | ICD-10-CM | POA: Diagnosis not present

## 2014-05-12 DIAGNOSIS — G459 Transient cerebral ischemic attack, unspecified: Secondary | ICD-10-CM | POA: Diagnosis not present

## 2014-05-12 DIAGNOSIS — E039 Hypothyroidism, unspecified: Secondary | ICD-10-CM | POA: Diagnosis not present

## 2014-05-12 DIAGNOSIS — E785 Hyperlipidemia, unspecified: Secondary | ICD-10-CM | POA: Diagnosis not present

## 2014-05-12 DIAGNOSIS — Z23 Encounter for immunization: Secondary | ICD-10-CM | POA: Diagnosis not present

## 2014-05-12 DIAGNOSIS — Z1331 Encounter for screening for depression: Secondary | ICD-10-CM | POA: Diagnosis not present

## 2014-05-12 DIAGNOSIS — I10 Essential (primary) hypertension: Secondary | ICD-10-CM | POA: Diagnosis not present

## 2014-06-02 DIAGNOSIS — N390 Urinary tract infection, site not specified: Secondary | ICD-10-CM | POA: Diagnosis not present

## 2014-06-20 DIAGNOSIS — N390 Urinary tract infection, site not specified: Secondary | ICD-10-CM | POA: Diagnosis not present

## 2014-08-17 DIAGNOSIS — R300 Dysuria: Secondary | ICD-10-CM | POA: Diagnosis not present

## 2014-08-28 DIAGNOSIS — Z23 Encounter for immunization: Secondary | ICD-10-CM | POA: Diagnosis not present

## 2014-11-14 DIAGNOSIS — K219 Gastro-esophageal reflux disease without esophagitis: Secondary | ICD-10-CM | POA: Diagnosis not present

## 2014-11-14 DIAGNOSIS — R251 Tremor, unspecified: Secondary | ICD-10-CM | POA: Diagnosis not present

## 2014-11-14 DIAGNOSIS — G459 Transient cerebral ischemic attack, unspecified: Secondary | ICD-10-CM | POA: Diagnosis not present

## 2014-11-14 DIAGNOSIS — M1990 Unspecified osteoarthritis, unspecified site: Secondary | ICD-10-CM | POA: Diagnosis not present

## 2014-11-14 DIAGNOSIS — I10 Essential (primary) hypertension: Secondary | ICD-10-CM | POA: Diagnosis not present

## 2014-11-14 DIAGNOSIS — E039 Hypothyroidism, unspecified: Secondary | ICD-10-CM | POA: Diagnosis not present

## 2014-11-14 DIAGNOSIS — E559 Vitamin D deficiency, unspecified: Secondary | ICD-10-CM | POA: Diagnosis not present

## 2014-11-29 DIAGNOSIS — R609 Edema, unspecified: Secondary | ICD-10-CM | POA: Diagnosis not present

## 2014-11-29 DIAGNOSIS — R251 Tremor, unspecified: Secondary | ICD-10-CM | POA: Diagnosis not present

## 2014-11-29 DIAGNOSIS — Z0000 Encounter for general adult medical examination without abnormal findings: Secondary | ICD-10-CM | POA: Diagnosis not present

## 2014-11-29 DIAGNOSIS — I10 Essential (primary) hypertension: Secondary | ICD-10-CM | POA: Diagnosis not present

## 2015-03-06 ENCOUNTER — Other Ambulatory Visit: Payer: Self-pay | Admitting: Obstetrics & Gynecology

## 2015-03-06 DIAGNOSIS — N6320 Unspecified lump in the left breast, unspecified quadrant: Secondary | ICD-10-CM

## 2015-03-13 ENCOUNTER — Other Ambulatory Visit: Payer: Self-pay | Admitting: Obstetrics & Gynecology

## 2015-03-13 DIAGNOSIS — Z6830 Body mass index (BMI) 30.0-30.9, adult: Secondary | ICD-10-CM | POA: Diagnosis not present

## 2015-03-13 DIAGNOSIS — Z1272 Encounter for screening for malignant neoplasm of vagina: Secondary | ICD-10-CM | POA: Diagnosis not present

## 2015-03-13 DIAGNOSIS — N390 Urinary tract infection, site not specified: Secondary | ICD-10-CM | POA: Diagnosis not present

## 2015-03-13 DIAGNOSIS — Z124 Encounter for screening for malignant neoplasm of cervix: Secondary | ICD-10-CM | POA: Diagnosis not present

## 2015-03-14 LAB — CYTOLOGY - PAP

## 2015-04-13 ENCOUNTER — Other Ambulatory Visit: Payer: Medicare Other

## 2015-04-16 ENCOUNTER — Ambulatory Visit
Admission: RE | Admit: 2015-04-16 | Discharge: 2015-04-16 | Disposition: A | Discharge status: Home / Self Care | Payer: Medicare Other | Source: Ambulatory Visit | Attending: Obstetrics & Gynecology | Admitting: Obstetrics & Gynecology

## 2015-04-16 ENCOUNTER — Other Ambulatory Visit: Payer: Self-pay | Admitting: Obstetrics & Gynecology

## 2015-04-16 DIAGNOSIS — N6320 Unspecified lump in the left breast, unspecified quadrant: Secondary | ICD-10-CM

## 2015-04-16 DIAGNOSIS — D242 Benign neoplasm of left breast: Secondary | ICD-10-CM | POA: Diagnosis not present

## 2015-04-16 DIAGNOSIS — N6489 Other specified disorders of breast: Secondary | ICD-10-CM | POA: Diagnosis not present

## 2015-04-27 ENCOUNTER — Other Ambulatory Visit: Payer: Self-pay | Admitting: Obstetrics & Gynecology

## 2015-04-27 ENCOUNTER — Ambulatory Visit
Admission: RE | Admit: 2015-04-27 | Discharge: 2015-04-27 | Disposition: A | Discharge status: Home / Self Care | Payer: Medicare Other | Source: Ambulatory Visit | Attending: Obstetrics & Gynecology | Admitting: Obstetrics & Gynecology

## 2015-04-27 DIAGNOSIS — N6320 Unspecified lump in the left breast, unspecified quadrant: Secondary | ICD-10-CM

## 2015-04-27 DIAGNOSIS — N6489 Other specified disorders of breast: Secondary | ICD-10-CM | POA: Diagnosis not present

## 2015-04-27 DIAGNOSIS — Z48817 Encounter for surgical aftercare following surgery on the skin and subcutaneous tissue: Secondary | ICD-10-CM | POA: Diagnosis not present

## 2015-04-27 DIAGNOSIS — R928 Other abnormal and inconclusive findings on diagnostic imaging of breast: Secondary | ICD-10-CM | POA: Diagnosis not present

## 2015-04-27 DIAGNOSIS — N6011 Diffuse cystic mastopathy of right breast: Secondary | ICD-10-CM | POA: Diagnosis not present

## 2015-05-01 ENCOUNTER — Other Ambulatory Visit: Payer: Self-pay | Admitting: Obstetrics & Gynecology

## 2015-05-01 DIAGNOSIS — N6320 Unspecified lump in the left breast, unspecified quadrant: Secondary | ICD-10-CM

## 2015-05-03 ENCOUNTER — Ambulatory Visit
Admission: RE | Admit: 2015-05-03 | Discharge: 2015-05-03 | Disposition: A | Discharge status: Home / Self Care | Payer: Medicare Other | Source: Ambulatory Visit | Attending: Obstetrics & Gynecology | Admitting: Obstetrics & Gynecology

## 2015-05-03 ENCOUNTER — Other Ambulatory Visit: Payer: Self-pay | Admitting: Obstetrics & Gynecology

## 2015-05-03 DIAGNOSIS — N6320 Unspecified lump in the left breast, unspecified quadrant: Secondary | ICD-10-CM

## 2015-05-03 DIAGNOSIS — R928 Other abnormal and inconclusive findings on diagnostic imaging of breast: Secondary | ICD-10-CM | POA: Diagnosis not present

## 2015-05-03 DIAGNOSIS — N6489 Other specified disorders of breast: Secondary | ICD-10-CM | POA: Diagnosis not present

## 2015-05-03 DIAGNOSIS — C50212 Malignant neoplasm of upper-inner quadrant of left female breast: Secondary | ICD-10-CM | POA: Diagnosis not present

## 2015-05-04 ENCOUNTER — Other Ambulatory Visit: Payer: Self-pay | Admitting: Obstetrics & Gynecology

## 2015-05-04 DIAGNOSIS — Z853 Personal history of malignant neoplasm of breast: Secondary | ICD-10-CM

## 2015-05-07 ENCOUNTER — Telehealth: Payer: Self-pay | Admitting: *Deleted

## 2015-05-07 ENCOUNTER — Encounter: Payer: Self-pay | Admitting: *Deleted

## 2015-05-07 DIAGNOSIS — C50511 Malignant neoplasm of lower-outer quadrant of right female breast: Secondary | ICD-10-CM

## 2015-05-07 HISTORY — DX: Malignant neoplasm of lower-outer quadrant of right female breast: C50.511

## 2015-05-07 NOTE — Telephone Encounter (Signed)
Confirmed [facility] for 05/09/15 at 1200.  Instructions and contact information given.

## 2015-05-08 ENCOUNTER — Ambulatory Visit
Admission: RE | Admit: 2015-05-08 | Discharge: 2015-05-08 | Disposition: A | Discharge status: Home / Self Care | Payer: Medicare Other | Source: Ambulatory Visit | Attending: Obstetrics & Gynecology | Admitting: Obstetrics & Gynecology

## 2015-05-08 DIAGNOSIS — C50212 Malignant neoplasm of upper-inner quadrant of left female breast: Secondary | ICD-10-CM | POA: Diagnosis not present

## 2015-05-08 DIAGNOSIS — N62 Hypertrophy of breast: Secondary | ICD-10-CM | POA: Diagnosis not present

## 2015-05-08 DIAGNOSIS — Z853 Personal history of malignant neoplasm of breast: Secondary | ICD-10-CM

## 2015-05-08 MED ORDER — GADOBENATE DIMEGLUMINE 529 MG/ML IV SOLN
15.0000 mL | Freq: Once | INTRAVENOUS | Status: AC | PRN
  Administered 2015-05-08: 15 mL via INTRAVENOUS

## 2015-05-09 ENCOUNTER — Ambulatory Visit: Payer: Self-pay | Admitting: Surgery

## 2015-05-09 ENCOUNTER — Encounter: Payer: Self-pay | Admitting: Genetic Counselor

## 2015-05-09 ENCOUNTER — Ambulatory Visit: Payer: Medicare Other | Admitting: Physical Therapy

## 2015-05-09 ENCOUNTER — Ambulatory Visit
Admission: RE | Admit: 2015-05-09 | Discharge: 2015-05-09 | Disposition: A | Discharge status: Home / Self Care | Payer: Medicare Other | Source: Ambulatory Visit | Attending: Radiation Oncology | Admitting: Radiation Oncology

## 2015-05-09 ENCOUNTER — Ambulatory Visit (HOSPITAL_BASED_OUTPATIENT_CLINIC_OR_DEPARTMENT_OTHER): Payer: Medicare Other | Admitting: Oncology

## 2015-05-09 ENCOUNTER — Encounter: Payer: Self-pay | Admitting: Adult Health

## 2015-05-09 ENCOUNTER — Other Ambulatory Visit (HOSPITAL_BASED_OUTPATIENT_CLINIC_OR_DEPARTMENT_OTHER): Payer: Medicare Other

## 2015-05-09 ENCOUNTER — Ambulatory Visit: Payer: Medicare Other

## 2015-05-09 ENCOUNTER — Encounter: Payer: Self-pay | Admitting: Oncology

## 2015-05-09 VITALS — BP 150/74 | HR 69 | Temp 98.3°F | Resp 18 | Ht 64.0 in | Wt 169.0 lb

## 2015-05-09 DIAGNOSIS — Z803 Family history of malignant neoplasm of breast: Secondary | ICD-10-CM

## 2015-05-09 DIAGNOSIS — C50912 Malignant neoplasm of unspecified site of left female breast: Secondary | ICD-10-CM | POA: Diagnosis not present

## 2015-05-09 DIAGNOSIS — Z170 Estrogen receptor positive status [ER+]: Secondary | ICD-10-CM | POA: Diagnosis not present

## 2015-05-09 DIAGNOSIS — C50212 Malignant neoplasm of upper-inner quadrant of left female breast: Secondary | ICD-10-CM

## 2015-05-09 DIAGNOSIS — C50511 Malignant neoplasm of lower-outer quadrant of right female breast: Secondary | ICD-10-CM

## 2015-05-09 DIAGNOSIS — N6091 Unspecified benign mammary dysplasia of right breast: Secondary | ICD-10-CM

## 2015-05-09 DIAGNOSIS — G454 Transient global amnesia: Secondary | ICD-10-CM

## 2015-05-09 LAB — CBC WITH DIFFERENTIAL/PLATELET
BASO%: 0.7 % (ref 0.0–2.0)
Basophils Absolute: 0.1 10*3/uL (ref 0.0–0.1)
EOS%: 0.9 % (ref 0.0–7.0)
Eosinophils Absolute: 0.1 10*3/uL (ref 0.0–0.5)
HCT: 39.3 % (ref 34.8–46.6)
HEMOGLOBIN: 13.2 g/dL (ref 11.6–15.9)
LYMPH%: 22.2 % (ref 14.0–49.7)
MCH: 30.3 pg (ref 25.1–34.0)
MCHC: 33.7 g/dL (ref 31.5–36.0)
MCV: 89.9 fL (ref 79.5–101.0)
MONO#: 0.6 10*3/uL (ref 0.1–0.9)
MONO%: 7.3 % (ref 0.0–14.0)
NEUT#: 5.3 10*3/uL (ref 1.5–6.5)
NEUT%: 68.9 % (ref 38.4–76.8)
Platelets: 251 10*3/uL (ref 145–400)
RBC: 4.37 10*6/uL (ref 3.70–5.45)
RDW: 13.5 % (ref 11.2–14.5)
WBC: 7.7 10*3/uL (ref 3.9–10.3)
lymph#: 1.7 10*3/uL (ref 0.9–3.3)

## 2015-05-09 LAB — COMPREHENSIVE METABOLIC PANEL (CC13)
ALK PHOS: 86 U/L (ref 40–150)
ALT: 52 U/L (ref 0–55)
ANION GAP: 8 meq/L (ref 3–11)
AST: 38 U/L — AB (ref 5–34)
Albumin: 4.2 g/dL (ref 3.5–5.0)
BILIRUBIN TOTAL: 0.58 mg/dL (ref 0.20–1.20)
BUN: 17.3 mg/dL (ref 7.0–26.0)
CALCIUM: 9.8 mg/dL (ref 8.4–10.4)
CO2: 27 meq/L (ref 22–29)
Chloride: 106 mEq/L (ref 98–109)
Creatinine: 0.8 mg/dL (ref 0.6–1.1)
EGFR: 66 mL/min/{1.73_m2} — ABNORMAL LOW (ref >90)
Glucose: 96 mg/dl (ref 70–140)
Potassium: 3.8 mEq/L (ref 3.5–5.1)
Sodium: 140 mEq/L (ref 136–145)
Total Protein: 7.3 g/dL (ref 6.4–8.3)

## 2015-05-09 NOTE — H&P (Signed)
Courtney Shea 05/09/2015 8:47 AM Location: [facility] Patient #: [id number] DOB: Nov 13, 1929 Undefined / Language: Courtney Shea / Race: Undefined Female History of Present Illness Courtney Shea A. Diante Barley MD; 05/09/2015 2:33 PM) Patient words: PT Courtney Shea LEFT BREAST CANCER TIMES TWO AND AREA OF ALH RIGHT BREAST DIAGNOSED BY MAMMOGRAM, U/S , CORE BIOPSY AND MRI. There ar two area in the left breast 4 cm apart central to medial in location measuring 3.2 cm and 1 . 3 cm both appear to be lobular breast carcinoma and area min the right breast is ALH. The tumor is ER/PR positive her 2 neu negative. No history of breast pain, mass or discharge         ADDITIONAL INFORMATION: 1. PROGNOSTIC INDICATORS - ACIS Results: IMMUNOHISTOCHEMICAL AND MORPHOMETRIC ANALYSIS BY THE AUTOMATED CELLULAR IMAGING SYSTEM (ACIS) Estrogen Receptor: 95%, POSITIVE, STRONG STAINING INTENSITY (PERFORMED MANUALLY) Progesterone Receptor: 1%, POSITIVE, MODERATE STAINING INTENSITY (PERFORMED MANUALLY) Proliferation Marker Ki67: 5% (PERFORMED MANUALLY) REFERENCE RANGE ESTROGEN RECEPTOR NEGATIVE <1% POSITIVE =>1% PROGESTERONE RECEPTOR NEGATIVE <1% POSITIVE =>1% All controls stained appropriately Courtney Shea RUND DO Pathologist, Electronic Signature ( Signed 05/09/2015) 1. FLUORESCENCE IN-SITU HYBRIDIZATION Results: HER2 - NEGATIVE RATIO OF HER2/CEP17 SIGNALS 1.52 AVERAGE HER2 COPY NUMBER PER CELL 2.05 Reference Range: NEGATIVE HER2/CEP17 Ratio <2.0 and average HER2 copy number <4.0 1 of 3 FINAL for Courtney Shea, Courtney Shea ([id number]) ADDITIONAL INFORMATION:(continued) EQUIVOCAL HER2/CEP17 Ratio <2.0 and average HER2 copy number >=4.0 and <6.0 POSITIVE HER2/CEP17 Ratio >=2.0 or <2.0 and average HER2 copy number >=6.0 Courtney Shea RUND DO Pathologist, Electronic Signature ( Signed 05/09/2015) FINAL DIAGNOSIS Diagnosis 1. Breast, left, needle core biopsy, upper inner quadrant - INVASIVE AND  IN SITU MAMMARY CARCINOMA. - SEE COMMENT. 2. Breast, left, needle core biopsy, upper inner quadrant, #2 1 cm posterior to #1 - INVASIVE AND IN SITU MAMMARY CARCINOMA. - SEE COMMENT. Microscopic Comment 1. The carcinoma appears grade 1-2 and is likely a lobular phenotype. A breast prognostic profile will be performed and the results reported separately. The results were called to [facility] on 05/04/15. 2. The carcinoma in part 2 is morphologically identical to that in part 1. A breast prognostic profile will not be performed on part 2 unless requested. (JBK:gt, 05/04/15) Courtney Cutter MD Pathologist, Electronic Signature (Case signed 05/04/2015) Specimen Gross and Clinical Information Specimen Comment 1. In formalin 10:20, extracted 2 min; two areas of arch distortion: patient with known CSL on right; two adjacent upper inner quadrant lesions which look the same, #1 1 cm anterior to #2 2. In formalin 10:24, extracted 2 min Specimen(s) Obtained: 1. Breast, left, needle core biopsy, upper inner quadrant 2. Breast, left, needle core biopsy, upper inner quadrant, #2 1 cm posterior to #1 Specimen Clinical Information 1. Prob CSLs vs. carcinoma Gross 1. Received in formalin are six white-yellow fibrofatty core tissue averaging 3.5 cm in length x 0.2 cm in diameter. The tissues are entirely submitted as follows: A= three cores B= three cores. 2 blocks total. Time in formalin 10:20 a.m., cold ischemic time two minutes. 2 of 3 FINAL for Courtney Shea, Courtney Shea ([id number]) Gross(continued) 2. Received in formalin are seven white rubbery tubular core tissue ranging from 1.5 to 3.0 cm in length x 0.3 cm in diameter. The tissues are entirely submitted in two blocks as follows: A= four cores B= three cores. Time in formalin 10:24 a.m., cold ischemic time two minutes. (KF:gt, 05/04/15) Stain(s) used in Diagnosis: The following stain(s) were used in diagnosing the  case: Her2 FISH,  ER-ACIS, PR-ACIS, KI-67-ACIS. The control(s) stained appropriately. Disclaimer HER2 IQFISH pharmDX (code [id number]) is a direct fluorescence in-situ hybridization assay designed to quantitatively determine HER2 gene amplification in formalin-fixed, paraffin-embedded tissue specimens. It is performed at [facility] and is reported using ASCO/CAP scoring criteria published in 2013. PR progesterone receptor (PgR 636), immunohistochemical stains are performed on formalin fixed, paraffin embedded tissue using a 3,3"-diaminobenzidine (DAB) chromogen and DAKO Autostainer System. The staining intensity of the nucleus is scored morphometrically using the Automated Cellular Imaging System (ACIS) and is reported as the percentage of tumor cell nuclei demonstrating specific nuclear staining. Estrogen receptor (SP1), immunohistochemical stains are performed on formalin fixed, paraffin embedded tissue using a 3,3"-diaminobenzidine (DAB) chromogen and DAKO Autostainer System. The staining intensity of the nucleus is scored morphometrically using the Automated Cellular Imaging System (ACIS) and is reported as the percentage of tumor cell nuclei demonstrating specific nuclear staining. Ki-67 (Mib-1), immunohistochemical stains are performed on formalin fixed, paraffin embedded tissue using a 3,3"-diaminobenzidine (DAB) chromogen and Page. The staining intensity of the nucleus is scored morphometrically using the Automated Cellular Imaging System (ACIS) and is reported as the percentage of tumor cell nuclei demonstrating specific nuclear staining. Report signed out from the following location(s) Technical Component was performed at [facility]. [address],[place] [address].[id number]., Technical component and interpretation was performed at [address], [place], [place] [address]. CLIA #: [id number], 3  of          CLINICAL DATA: 81 year old female with invasive mammary carcinoma and mammary carcinoma newly diagnosed in 2 separate areas of the upper inner LEFT breast. Also newly diagnosed complex sclerosing lesion and atypical lobular hyperplasia within the lower outer RIGHT breast.  LABS: BUN and creatinine were obtained on site at [facility] at  [address]  Results: BUN 22 mg/dL, Creatinine 0.8 mg/dL.  EXAM: BILATERAL BREAST MRI WITH AND WITHOUT CONTRAST  TECHNIQUE: Multiplanar, multisequence MR images of both breasts were obtained prior to and following the intravenous administration of 15 ml of MultiHance.  THREE-DIMENSIONAL MR IMAGE RENDERING ON INDEPENDENT WORKSTATION:  Three-dimensional MR images were rendered by post-processing of the original MR data on an independent workstation. The three-dimensional MR images were interpreted, and findings are reported in the following complete MRI report for this study. Three dimensional images were evaluated at the independent DynaCad workstation  COMPARISON: Recent mammograms.  FINDINGS: Breast composition: d. Extreme fibroglandular tissue.  Background parenchymal enhancement: Mild  Right breast: Biopsy clip artifact within the retroareolar right breast is identified. Post biopsy changes within the retroareolar and anterior slightly lower outer right breast are identified. No other abnormal areas of enhancement are identified.  Left breast: A 2.5 x 3.2 x 3 cm area of ill-defined enhancement within the upper inner left breast, middle third, is noted with biopsy clip artifact in the center of this area, compatible with post biopsy changes/ biopsy-proven malignancy.  A 1.2 x 1.3 x 1 cm area of ill-defined enhancement with biopsy clip artifact within the upper inner left breast at the junction of the anterior and middle thirds is noted compatible with post biopsy changes/ biopsy-proven  malignancy.  No other abnormal areas of enhancement are identified.  Lymph nodes: A level 1 left axillary lymph node has mild focal cortical thickening medially. No other abnormal appearing lymph nodes are identified.  Ancillary findings: None.  IMPRESSION: Two areas of ill-defined enhancement within the upper inner left breast compatible  with post biopsy changes/ biopsy-proven malignancy. The posterior area measures 2.5 x 3.2 x 3 cm and anterior area measures 1.2 x 1.3 x 1 cm. The biopsy clips are separated by a distance of approximately 4 cm craniocaudally and 3 cm AP.  One level 1 left axillary lymph node with mild focal cortical thickening medially -indeterminate.  Post biopsy changes within the anterior right breast in the area of biopsy-proven complex sclerosing lesion/ALH.  RECOMMENDATION: Treatment plan.  BI-RADS CATEGORY 6: Known biopsy-proven malignancy.   Electronically Signed By: Margarette Canada M.D.       Diagnosis Breast, right, needle core biopsy, LOQ - LOBULAR NEOPLASIA (ATYPICAL LOBULAR HYPERPLASIA). - COMPLEX SCLEROSING LESION. - FIBROCYSTIC CHANGES. - SEE COMMENT. Microscopic Comment.  The patient is a 81 year old female   Other Problems Anderson Malta [place], [place]; 05/09/2015 8:47 AM) Bladder Problems Breast Cancer High blood pressure Other disease, cancer, significant illness  Past Surgical History Anderson Malta [place], RMA; 05/09/2015 8:47 AM) Breast Biopsy Bilateral. Hysterectomy (due to cancer) - Partial  Diagnostic Studies History Anderson Malta [place], RMA; 05/09/2015 8:47 AM) Colonoscopy 5-10 years ago Mammogram within last year Pap Smear 1-5 years ago  Social History Anderson Malta [place], RMA; 05/09/2015 8:47 AM) Caffeine use Carbonated beverages, Coffee. No alcohol use No drug use Tobacco use Never smoker.  Family History Anderson Malta [place], [place]; 05/09/2015 8:47 AM) Cerebrovascular Accident Father, Mother.  Pregnancy / Birth History  Anderson Malta [place], [place]; 05/09/2015 8:47 AM) Age at menarche 75 years. Age of menopause <47 Gravida 2 Maternal age 41-25 Para 2 Regular periods     Review of Systems Anderson Malta [place] RMA; 05/09/2015 8:47 AM) General Not Present- Appetite Loss, Chills, Fatigue, Fever, Night Sweats, Weight Gain and Weight Loss. Skin Not Present- Change in Wart/Mole, Dryness, Hives, Jaundice, New Lesions, Non-Healing Wounds, Rash and Ulcer. HEENT Not Present- Earache, Hearing Loss, Hoarseness, Nose Bleed, Oral Ulcers, Ringing in the Ears, Seasonal Allergies, Sinus Pain, Sore Throat, Visual Disturbances, Wears glasses/contact lenses and Yellow Eyes. Respiratory Not Present- Bloody sputum, Chronic Cough, Difficulty Breathing, Snoring and Wheezing. Breast Not Present- Breast Mass, Breast Pain, Nipple Discharge and Skin Changes. Cardiovascular Not Present- Chest Pain, Difficulty Breathing Lying Down, Leg Cramps, Palpitations, Rapid Heart Rate, Shortness of Breath and Swelling of Extremities. Gastrointestinal Not Present- Abdominal Pain, Bloating, Bloody Stool, Change in Bowel Habits, Chronic diarrhea, Constipation, Difficulty Swallowing, Excessive gas, Gets full quickly at meals, Hemorrhoids, Indigestion, Nausea, Rectal Pain and Vomiting. Female Genitourinary Not Present- Frequency, Nocturia, Painful Urination, Pelvic Pain and Urgency. Musculoskeletal Not Present- Back Pain, Joint Pain, Joint Stiffness, Muscle Pain, Muscle Weakness and Swelling of Extremities. Hematology Not Present- Easy Bruising, Excessive bleeding, Gland problems, HIV and Persistent Infections.   Physical Exam (Ephriam Turman A. Jr Milliron MD; 05/09/2015 2:28 PM)  General Mental Status-Alert. General Appearance-Consistent with stated age. Hydration-Well hydrated. Voice-Normal.  Head and Neck Head-normocephalic, atraumatic with no lesions or palpable masses. Trachea-midline. Thyroid Gland Characteristics - normal size and  consistency.  Chest and Lung Exam Chest and lung exam reveals -quiet, even and easy respiratory effort with no use of accessory muscles and on auscultation, normal breath sounds, no adventitious sounds and normal vocal resonance. Inspection Chest Wall - Normal. Back - normal.  Breast Note: right breast no nass or discharge bx site noted  left breast biopsy sites noted minimal bruise no mass or nipple discharge   Cardiovascular Cardiovascular examination reveals -normal heart sounds, regular rate and rhythm with no murmurs and normal pedal pulses bilaterally.  Musculoskeletal Normal Exam - Left-Upper Extremity Strength Normal  and Lower Extremity Strength Normal. Normal Exam - Right-Upper Extremity Strength Normal and Lower Extremity Strength Normal.  Lymphatic Head & Neck  General Head & Neck Lymphatics: Bilateral - Description - Normal. Axillary  General Axillary Region: Bilateral - Description - Normal. Tenderness - Non Tender.    Assessment & Plan (Arcenia Scarbro A. Dejay Kronk MD; 05/09/2015 2:32 PM)  BREAST CANCER, STAGE 2, LEFT (174.9  C50.912) Impression: appears to be lobular in nature pt desires breast conservation and is ok with possible cosmetic deformity. The LN on MRI appears not to be pathologic and will not impact treatment in this cace. The area in the right breast needs excision. Recommended bilateral wire localized partial mastectomy with 2 wires in the left breast and one in the right. Risk of partial include bleeding, infection, seroma, more surgery, use of seed/wire, wound care, cosmetic deformity and the need for other treatments, death , blood clots, death. Pt agrees to proceed. No sentinel node for this case since risk outweigh benefits.  Current Plans Pt Education - Breast Cancer: discussed with patient and provided information. Pt Education - [facility] Breast Biopsy [facility]

## 2015-05-09 NOTE — Progress Notes (Signed)
Checked in new pt with no financial concerns prior to seeing the dr.  [name] has 2 insurances so financial assistance may not be needed but she has my card for any questions or concerns.

## 2015-05-09 NOTE — Progress Notes (Signed)
[facility]  [phone] Fax:(336) [phone]     ID: RICA HEATHER DOB: May 05, 1930  MR#: [id number]  FUX#:[id number]  Patient Care Team: Josetta Huddle, MD as PCP - General (Internal Medicine) Chauncey Cruel, MD as Consulting Physician (Oncology) Eppie Gibson, MD as Attending Physician (Radiation Oncology) Erroll Luna, MD as Consulting Physician (General Surgery) Mauro Kaufmann, RN as Registered Nurse Rockwell Germany, RN as Registered Nurse Holley Bouche, NP as Nurse Practitioner (Nurse Practitioner) PCP: Henrine Screws, MD GYN: Dory Horn MD OTHER MD: Kirk Ruths MD, Wilhemina Bonito MD  CHIEF COMPLAINT: Estrogen receptor positive breast cancer  CURRENT TREATMENT: Awaiting definitive surgery, then radiation   BREAST CANCER HISTORY: The patient has had multiple diagnostic mammographies and ultrasonography his dating back to September 2013 following 2 areas in the left breast, one of the 2:00 position, the other at the 3:00 position. These were felt to be most likely fibroadenomatous. Repeat left ultrasonography 02/04/2013 showed them to be stable. On 04/11/2014 bilateral diagnostic mammography with ultrasonography found nothing on mammography, and by ultrasound the 2 masses previously described were again seen and were again unchanged.  On 04/16/2015, with bilateral diagnostic mammography with tomosynthesis and bilateral breast ultrasonography at the breast Center, the patient's breast density was read as category D. In the right breast there was an area of distortion in the lower outer quadrant. This was not palpable. By ultrasound there was a vague area of distortion measuring 0.9 cm. The right axilla was negative.  In the left breast upper inner quadrant there was an area of possible distortion with no discrete mass. The previously noted fibroadenomas were again seen.  On 04/27/2015 the patient underwent stereotactic biopsy of the right lower  outer quadrant area of distortion. This showed ([id number]) atypical lobular hyperplasia including a radial scar. On 05/03/2015 the patient underwent biopsy of two adjacent areas of architectural distortion in the upper inner left breast. This showed ([id number]) invasive lobular breast cancer in both biopsies, which were morphologically identical. Estrogen receptor was 95% positive. Progesterone receptor was 1% "positive". The proliferation marker was 5%. There was no HER-2 amplification, with a signals ratio of 1.52, the number per cell being 2.05.  On 05/08/2015 the patient underwent bilateral breast MRIs. In the right breast only post biopsy changes were seen. In the left breast there was a 3.27 m area of ill-defined enhancement and a second area measuring 1.3 cm, the whole measuring up to 4.0 cm at least when added together. There was a level I left axillary lymph node with minimal cortical thickening of uncertain significance.  The patient's subsequent history is as detailed below  INTERVAL HISTORY: Graclynn was evaluated in the multidisciplinary breast cancer clinic 05/09/2015 accompanied by her husband [place]. Her case was also presented at the multidisciplinary breast cancer conference that same morning. At that time a preliminary plan was proposed, namely 4 bilateral lumpectomies with no left sentinel lymph node sampling. It was felt the patient would not benefit from chemotherapy given her age and the very low MIB-1- of her left-sided tumor.  REVIEW OF SYSTEMS: There were no specific symptoms leading to the original mammogram, which was routinely scheduled. The patient denies unusual headaches, visual changes, nausea, vomiting, stiff neck, dizziness, or gait imbalance. She had a transient ischemic attack sometime ago with no residuals There has been no cough, phlegm production, or pleurisy, no chest pain or pressure, and no change in bowel or bladder habits. The patient denies fever, rash,  bleeding, unexplained fatigue or unexplained weight loss. Aside from housework she does not exercise regularly. A detailed review of systems was otherwise entirely negative.  PAST MEDICAL HISTORY: Past Medical History  Diagnosis Date  . Hypertension   . Hypothyroid   . GERD (gastroesophageal reflux disease)   . Syncope   . Ventricular bigeminy   . Stroke   . Breast cancer of lower-outer quadrant of right female breast 05/07/2015  . Breast cancer     PAST SURGICAL HISTORY: Past Surgical History  Procedure Laterality Date  . Abdominal hysterectomy    . Ovarian cyst removed    . Incontinence surgery    . Bladder suspension    . Cataract extraction      FAMILY HISTORY Family History  Problem Relation Age of Onset  . Coronary artery disease Mother     MI at age 62  . Breast cancer Sister 24   the patient's father died at age 50, the patient's mother at age 52, both from strokes. The patient had one brother, 2 sisters. One sister was diagnosed with breast cancer at age 60. There is no history of ovarian cancer in the family.  GYNECOLOGIC HISTORY:  No LMP recorded. Patient is postmenopausal. Menarche age 56, first live birth age 12. The patient is GX P2. She underwent hysterectomy without salpingo-oophorectomy remotely, but then had bilateral salpingo-oophorectomy subsequently for removal ovarian cysts. She took hormone replacement for about 20 years, stopping in 2014.  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 Her husband [name] is a former Chief Financial Officer. Daughter [name] numb works for the [employer], orienting at risk students. Son Rodman Key lives in [place] where he does research and development for [employer]. The patient has 3 grandchildren. She attends the [employer] (her son-in-law is Theme park manager.    ADVANCED DIRECTIVES: In place   HEALTH MAINTENANCE: History  Substance Use Topics  . Smoking status: Never Smoker   .  Smokeless tobacco: Never Used  . Alcohol Use: No     Colonoscopy: 2011/[facility]  PAP: 2016  Bone density: 2001/normal  Lipid panel:  Allergies  Allergen Reactions  . Macrodantin [Nitrofurantoin Macrocrystal] Nausea Only    "extreme nausea"  . Codeine Nausea And Vomiting  . Crestor [Rosuvastatin Calcium] Other (See Comments)    "aching in legs"  . Doxycycline Itching and Rash  . Hydrochlorothiazide Rash    "all over body rash"  . Norvasc [Amlodipine Besylate] Swelling    "swelling in ankles"  . Sulfa Antibiotics Nausea And Vomiting    Current Outpatient Prescriptions  Medication Sig Dispense Refill  . aspirin EC 81 MG tablet Take 81 mg by mouth every evening.    . Calcium Carbonate-Vitamin D (CALCIUM 600 + D PO) Take 1 tablet by mouth every evening.     . Cholecalciferol (VITAMIN D) 2000 UNITS tablet Take 2,000 Units by mouth every evening.    . cloNIDine (CATAPRES) 0.2 MG tablet Take 0.2 mg by mouth at bedtime.  11  . clopidogrel (PLAVIX) 75 MG tablet Take 75 mg by mouth daily.    . furosemide (LASIX) 20 MG tablet Take 20 mg by mouth See admin instructions. Mon. Wed, Friday    . levothyroxine (SYNTHROID, LEVOTHROID) 88 MCG tablet Take 88 mcg by mouth daily.    Marland Kitchen losartan (COZAAR) 100 MG tablet Take 100 mg by mouth daily.    . metoprolol succinate (TOPROL-XL) 50 MG 24 hr tablet Take 50 mg  by mouth daily. Take with or immediately following a meal.    . pantoprazole (PROTONIX) 40 MG tablet Take 40 mg by mouth every evening.    . sennosides-docusate sodium (SENOKOT-S) 8.6-50 MG tablet Take 1 tablet by mouth daily as needed for constipation.      No current facility-administered medications for this visit.    OBJECTIVE: Older white woman in no acute distress Filed Vitals:   05/09/15 1245  BP: 150/74  Pulse: 69  Temp: 98.3 F (36.8 C)  Resp: 18     Body mass index is 28.99 kg/(m^2).    ECOG FS:0 - Asymptomatic  Ocular: Sclerae unicteric, EOMs  intact Ear-nose-throat: Oropharynx clear and moist Lymphatic: No cervical or supraclavicular adenopathy Lungs no rales or rhonchi, good excursion bilaterally Heart regular rate and rhythm, no murmur appreciated Abd soft, nontender, positive bowel sounds MSK no focal spinal tenderness, no joint edema Neuro: non-focal, well-oriented, appropriate affect Breasts: I do not palpate a mass in either breast. There are no skin or nipple changes of concern the side small ecchymoses related to the recent biopsies. Both axillae are benign.   LAB RESULTS:  CMP     Component Value Date/Time   NA 140 05/09/2015 1220   NA 141 06/20/2013 0830   K 3.8 05/09/2015 1220   K 3.4* 06/20/2013 0830   CL 102 06/20/2013 0830   CO2 27 05/09/2015 1220   CO2 27 06/20/2013 0830   GLUCOSE 96 05/09/2015 1220   GLUCOSE 119* 06/20/2013 0830   BUN 17.3 05/09/2015 1220   BUN 17 06/20/2013 0830   CREATININE 0.8 05/09/2015 1220   CREATININE 0.67 06/20/2013 0830   CALCIUM 9.8 05/09/2015 1220   CALCIUM 9.6 06/20/2013 0830   PROT 7.3 05/09/2015 1220   PROT 7.4 06/20/2013 0830   ALBUMIN 4.2 05/09/2015 1220   ALBUMIN 3.9 06/20/2013 0830   AST 38* 05/09/2015 1220   AST 25 06/20/2013 0830   ALT 52 05/09/2015 1220   ALT 30 06/20/2013 0830   ALKPHOS 86 05/09/2015 1220   ALKPHOS 66 06/20/2013 0830   BILITOT 0.58 05/09/2015 1220   BILITOT 0.3 06/20/2013 0830   GFRNONAA 81* 06/20/2013 0830   GFRAA >90 06/20/2013 0830    INo results found for: SPEP, UPEP  Lab Results  Component Value Date   WBC 7.7 05/09/2015   NEUTROABS 5.3 05/09/2015   HGB 13.2 05/09/2015   HCT 39.3 05/09/2015   MCV 89.9 05/09/2015   PLT 251 05/09/2015      Chemistry      Component Value Date/Time   NA 140 05/09/2015 1220   NA 141 06/20/2013 0830   K 3.8 05/09/2015 1220   K 3.4* 06/20/2013 0830   CL 102 06/20/2013 0830   CO2 27 05/09/2015 1220   CO2 27 06/20/2013 0830   BUN 17.3 05/09/2015 1220   BUN 17 06/20/2013 0830    CREATININE 0.8 05/09/2015 1220   CREATININE 0.67 06/20/2013 0830      Component Value Date/Time   CALCIUM 9.8 05/09/2015 1220   CALCIUM 9.6 06/20/2013 0830   ALKPHOS 86 05/09/2015 1220   ALKPHOS 66 06/20/2013 0830   AST 38* 05/09/2015 1220   AST 25 06/20/2013 0830   ALT 52 05/09/2015 1220   ALT 30 06/20/2013 0830   BILITOT 0.58 05/09/2015 1220   BILITOT 0.3 06/20/2013 0830       No results found for: LABCA2  No components found for: [id number]  No results for input(s): INR in the last  168 hours.  Urinalysis    Component Value Date/Time   COLORURINE YELLOW 06/20/2013 0910   APPEARANCEUR CLEAR 06/20/2013 0910   LABSPEC 1.004* 06/20/2013 0910   PHURINE 6.5 06/20/2013 0910   GLUCOSEU NEGATIVE 06/20/2013 0910   HGBUR NEGATIVE 06/20/2013 0910   BILIRUBINUR NEGATIVE 06/20/2013 0910   KETONESUR NEGATIVE 06/20/2013 0910   PROTEINUR NEGATIVE 06/20/2013 0910   UROBILINOGEN 0.2 06/20/2013 0910   NITRITE NEGATIVE 06/20/2013 0910   LEUKOCYTESUR TRACE* 06/20/2013 0910    STUDIES: Mr Breast Bilateral W Wo Contrast  05/08/2015   CLINICAL DATA:  81 year old female with invasive mammary carcinoma and mammary carcinoma newly diagnosed in 2 separate areas of the upper inner LEFT breast. Also newly diagnosed complex sclerosing lesion and atypical lobular hyperplasia within the lower outer RIGHT breast.  LABS:  BUN and creatinine were obtained on site at [facility] at  [address]  Results:  BUN 22 mg/dL,  Creatinine 0.8 mg/dL.  EXAM: BILATERAL BREAST MRI WITH AND WITHOUT CONTRAST  TECHNIQUE: Multiplanar, multisequence MR images of both breasts were obtained prior to and following the intravenous administration of 15 ml of MultiHance.  THREE-DIMENSIONAL MR IMAGE RENDERING ON INDEPENDENT WORKSTATION:  Three-dimensional MR images were rendered by post-processing of the original MR data on an independent workstation. The three-dimensional MR images were interpreted, and findings are  reported in the following complete MRI report for this study. Three dimensional images were evaluated at the independent DynaCad workstation  COMPARISON:  Recent mammograms.  FINDINGS: Breast composition: d. Extreme fibroglandular tissue.  Background parenchymal enhancement: Mild  Right breast: Biopsy clip artifact within the retroareolar right breast is identified. Post biopsy changes within the retroareolar and anterior slightly lower outer right breast are identified. No other abnormal areas of enhancement are identified.  Left breast: A 2.5 x 3.2 x 3 cm area of ill-defined enhancement within the upper inner left breast, middle third, is noted with biopsy clip artifact in the center of this area, compatible with post biopsy changes/ biopsy-proven malignancy.  A 1.2 x 1.3 x 1 cm area of ill-defined enhancement with biopsy clip artifact within the upper inner left breast at the junction of the anterior and middle thirds is noted compatible with post biopsy changes/ biopsy-proven malignancy.  No other abnormal areas of enhancement are identified.  Lymph nodes: A level 1 left axillary lymph node has mild focal cortical thickening medially. No other abnormal appearing lymph nodes are identified.  Ancillary findings:  None.  IMPRESSION: Two areas of ill-defined enhancement within the upper inner left breast compatible with post biopsy changes/ biopsy-proven malignancy. The posterior area measures 2.5 x 3.2 x 3 cm and anterior area measures 1.2 x 1.3 x 1 cm. The biopsy clips are separated by a distance of approximately 4 cm craniocaudally and 3 cm AP.  One level 1 left axillary lymph node with mild focal cortical thickening medially -indeterminate.  Post biopsy changes within the anterior right breast in the area of biopsy-proven complex sclerosing lesion/ALH.  RECOMMENDATION: Treatment plan.  BI-RADS CATEGORY  6: Known biopsy-proven malignancy.   Electronically Signed   By: Margarette Canada M.D.   On: 05/08/2015 09:53    US Breast Ltd Uni Left Inc Axilla  04/16/2015   CLINICAL DATA:  Patient presents for 2 year follow-up of probable fibroadenomas in the left breast.  EXAM: DIGITAL DIAGNOSTIC BILATERAL MAMMOGRAM WITH 3D TOMOSYNTHESIS WITH CAD  ULTRASOUND BILATERAL BREAST  COMPARISON:  Multiple prior studies including 04/11/2014  ACR Breast Density Category  d: The breast tissue is extremely dense, which lowers the sensitivity of mammography.  FINDINGS: Within the right breast there is an area of distortion in the lower outer quadrant of the right breast associated with faint calcifications. No definitive mass is associated with this distortion.  Within the upper inner quadrant of the left breast there are at least 2 sites of distortion.  Mammographic images were processed with CAD.  On physical exam, I palpate no abnormality in the lower outer quadrant of the right breast. I palpate no abnormality in the upper inner quadrant of the left breast.  Targeted ultrasound is performed, showing an area of vague distortion in the 8 o'clock location of the right breast 2 cm from the nipple. This measures 0.9 x 0.8 x 0.7 cm. Although the sonographic abnormality is felt to confirm the mammographic abnormality, targeting for biopsy would likely be more simple with mammographic guidance. Evaluation of the right axilla is negative for adenopathy.  Evaluation of the upper inner quadrant of the left breast reveals extremely dense fibroglandular tissue with some vague distortion. However, no discrete areas are identified to guide biopsy. Evaluation of the left axilla is negative for adenopathy.  Within the 3 o'clock location of the left breast 6 cm from the nipple a small hypoechoic oval nodule measures 0.7 x 0.4 x 0.6 cm. In the 2 o'clock location 3 cm from the nipple a small oval hypoechoic nodule is 0.8 x 0.5 x 0.8 cm. Findings are consistent benign fibroadenomas.  IMPRESSION: 1. Stable appearance of left breast fibroadenomas consistent benign  process. 2. In the lower outer quadrant of the right breast there is an area of suspicious distortion. Although confirmed with ultrasound, targeting with stereotactic guidance is recommended. 3. Within the upper inner quadrant of the left breast there are at least 2 areas of distortion. No definitive ultrasound correlate identified.  RECOMMENDATION: 1. Stereotactic guided core biopsy of distortion within the lower outer quadrant of the right breast. 2. Stereotactic guided core biopsy of 1 of the areas of distortion within the upper inner quadrant of the left breast. Subsequent MRI may be helpful for evaluating other areas of concern. Bilateral stereotactic guided core biopsies are scheduled for the patient on 04/27/2015 at 10 o'clock a.m.  I have discussed the findings and recommendations with the patient. Results were also provided in writing at the conclusion of the visit. If applicable, a reminder letter will be sent to the patient regarding the next appointment.  BI-RADS CATEGORY  4: Suspicious.   Electronically Signed   By: Nolon Nations M.D.   On: 04/16/2015 16:31   US Breast Ltd Uni Right Inc Axilla  04/16/2015   CLINICAL DATA:  Patient presents for 2 year follow-up of probable fibroadenomas in the left breast.  EXAM: DIGITAL DIAGNOSTIC BILATERAL MAMMOGRAM WITH 3D TOMOSYNTHESIS WITH CAD  ULTRASOUND BILATERAL BREAST  COMPARISON:  Multiple prior studies including 04/11/2014  ACR Breast Density Category d: The breast tissue is extremely dense, which lowers the sensitivity of mammography.  FINDINGS: Within the right breast there is an area of distortion in the lower outer quadrant of the right breast associated with faint calcifications. No definitive mass is associated with this distortion.  Within the upper inner quadrant of the left breast there are at least 2 sites of distortion.  Mammographic images were processed with CAD.  On physical exam, I palpate no abnormality in the lower outer quadrant of the  right breast. I palpate no abnormality in the upper inner  quadrant of the left breast.  Targeted ultrasound is performed, showing an area of vague distortion in the 8 o'clock location of the right breast 2 cm from the nipple. This measures 0.9 x 0.8 x 0.7 cm. Although the sonographic abnormality is felt to confirm the mammographic abnormality, targeting for biopsy would likely be more simple with mammographic guidance. Evaluation of the right axilla is negative for adenopathy.  Evaluation of the upper inner quadrant of the left breast reveals extremely dense fibroglandular tissue with some vague distortion. However, no discrete areas are identified to guide biopsy. Evaluation of the left axilla is negative for adenopathy.  Within the 3 o'clock location of the left breast 6 cm from the nipple a small hypoechoic oval nodule measures 0.7 x 0.4 x 0.6 cm. In the 2 o'clock location 3 cm from the nipple a small oval hypoechoic nodule is 0.8 x 0.5 x 0.8 cm. Findings are consistent benign fibroadenomas.  IMPRESSION: 1. Stable appearance of left breast fibroadenomas consistent benign process. 2. In the lower outer quadrant of the right breast there is an area of suspicious distortion. Although confirmed with ultrasound, targeting with stereotactic guidance is recommended. 3. Within the upper inner quadrant of the left breast there are at least 2 areas of distortion. No definitive ultrasound correlate identified.  RECOMMENDATION: 1. Stereotactic guided core biopsy of distortion within the lower outer quadrant of the right breast. 2. Stereotactic guided core biopsy of 1 of the areas of distortion within the upper inner quadrant of the left breast. Subsequent MRI may be helpful for evaluating other areas of concern. Bilateral stereotactic guided core biopsies are scheduled for the patient on 04/27/2015 at 10 o'clock a.m.  I have discussed the findings and recommendations with the patient. Results were also provided in writing at  the conclusion of the visit. If applicable, a reminder letter will be sent to the patient regarding the next appointment.  BI-RADS CATEGORY  4: Suspicious.   Electronically Signed   By: Nolon Nations M.D.   On: 04/16/2015 16:31   Mm Diag Breast Tomo Uni Left  05/03/2015   CLINICAL DATA:  Status post stereotactic core needle biopsy of 2 areas of architectural distortion in the upper inner left breast.  EXAM: DIAGNOSTIC LEFT MAMMOGRAM POST STEREOTACTIC BIOPSY  COMPARISON:  Previous exam(s).  FINDINGS: Mammographic images were obtained following stereotactic guided biopsy of 2 adjacent areas of architectural distortion in the upper inner left breast. The X shaped biopsy clip, which corresponds the more anterior of the 2 lesions, and the more posterior coil shaped biopsy clip, are both positioned in the areas of architectural distortion.  IMPRESSION: Well-positioned biopsy clips following stereotactic core needle biopsy of 2 areas of architectural distortion in the left breast.  Final Assessment: Post Procedure Mammograms for Marker Placement   Electronically Signed   By: Lajean Manes M.D.   On: 05/03/2015 10:53   Mm Diag Breast Tomo Uni Right  04/27/2015   CLINICAL DATA:  3D guided stereotactic biopsy right lower outer quadrant distortion  EXAM: DIAGNOSTIC right MAMMOGRAM POST 3D stereotactic BIOPSY  COMPARISON:  Previous exam(s).  FINDINGS: Mammographic images were obtained following 3D stereotactic guided biopsy of right lower outer quadrant distortion. The X shaped clip is appropriately located.  IMPRESSION: Appropriate X shaped clip location, right breast lower outer quadrant distortion.  Final Assessment: Post Procedure Mammograms for Marker Placement   Electronically Signed   By: Conchita Paris M.D.   On: 04/27/2015 10:57   Mm Diag Breast  Tomo Bilateral  04/16/2015   CLINICAL DATA:  Patient presents for 2 year follow-up of probable fibroadenomas in the left breast.  EXAM: DIGITAL DIAGNOSTIC BILATERAL  MAMMOGRAM WITH 3D TOMOSYNTHESIS WITH CAD  ULTRASOUND BILATERAL BREAST  COMPARISON:  Multiple prior studies including 04/11/2014  ACR Breast Density Category d: The breast tissue is extremely dense, which lowers the sensitivity of mammography.  FINDINGS: Within the right breast there is an area of distortion in the lower outer quadrant of the right breast associated with faint calcifications. No definitive mass is associated with this distortion.  Within the upper inner quadrant of the left breast there are at least 2 sites of distortion.  Mammographic images were processed with CAD.  On physical exam, I palpate no abnormality in the lower outer quadrant of the right breast. I palpate no abnormality in the upper inner quadrant of the left breast.  Targeted ultrasound is performed, showing an area of vague distortion in the 8 o'clock location of the right breast 2 cm from the nipple. This measures 0.9 x 0.8 x 0.7 cm. Although the sonographic abnormality is felt to confirm the mammographic abnormality, targeting for biopsy would likely be more simple with mammographic guidance. Evaluation of the right axilla is negative for adenopathy.  Evaluation of the upper inner quadrant of the left breast reveals extremely dense fibroglandular tissue with some vague distortion. However, no discrete areas are identified to guide biopsy. Evaluation of the left axilla is negative for adenopathy.  Within the 3 o'clock location of the left breast 6 cm from the nipple a small hypoechoic oval nodule measures 0.7 x 0.4 x 0.6 cm. In the 2 o'clock location 3 cm from the nipple a small oval hypoechoic nodule is 0.8 x 0.5 x 0.8 cm. Findings are consistent benign fibroadenomas.  IMPRESSION: 1. Stable appearance of left breast fibroadenomas consistent benign process. 2. In the lower outer quadrant of the right breast there is an area of suspicious distortion. Although confirmed with ultrasound, targeting with stereotactic guidance is  recommended. 3. Within the upper inner quadrant of the left breast there are at least 2 areas of distortion. No definitive ultrasound correlate identified.  RECOMMENDATION: 1. Stereotactic guided core biopsy of distortion within the lower outer quadrant of the right breast. 2. Stereotactic guided core biopsy of 1 of the areas of distortion within the upper inner quadrant of the left breast. Subsequent MRI may be helpful for evaluating other areas of concern. Bilateral stereotactic guided core biopsies are scheduled for the patient on 04/27/2015 at 10 o'clock a.m.  I have discussed the findings and recommendations with the patient. Results were also provided in writing at the conclusion of the visit. If applicable, a reminder letter will be sent to the patient regarding the next appointment.  BI-RADS CATEGORY  4: Suspicious.   Electronically Signed   By: Nolon Nations M.D.   On: 04/16/2015 16:31   Mm Lt Breast Bx W Loc Dev 1st Lesion Image Bx Spec Stereo Guide  05/08/2015   ADDENDUM REPORT: 05/08/2015 08:52  ADDENDUM: Pathology revealed invasive mammary carcinoma and mammary carcinoma in situ, likely lobular in the upper inner quadrant of the left breast. There was a second lesion in the upper inner quadrant of the left breast, 1 cm posterior, also showing invasive mammary carcinoma and mammary carcinoma in situ, likely lobular. This was found to be concordant by Dr. Lajean Manes. Pathology was discussed with the patient by telephone. She reported doing well after the biopsy with marked  tenderness in the left breast. Post biopsy instructions and care were reviewed and her questions were answered. She has been scheduled for [facility] on May 09, 2015. A bilateral breast MRI has been scheduled for May 08, 2015. My number was provided for additional questions and concerns.  Pathology results reported by Susa Raring RN, BSN on May 07, 2015.   Electronically Signed   By:  Lajean Manes M.D.   On: 05/08/2015 08:52   05/08/2015   CLINICAL DATA:  Patient presents for stereotactic core needle biopsy of 2 adjacent areas of architectural distortion in the medial left breast, slightly above midline. Patient underwent a recent biopsy of a similar area of distortion in the right breast, which revealed a complex sclerosing lesion and atypical lobular hyperplasia.  EXAM: BREAST STEREOTACTIC CORE NEEDLE BIOPSY  COMPARISON:  Previous exams.  FINDINGS: The patient and I discussed the procedure of stereotactic-guided biopsy including benefits and alternatives. We discussed the high likelihood of a successful procedure. We discussed the risks of the procedure including infection, bleeding, tissue injury, clip migration, and inadequate sampling. Informed written consent was given. The usual time out protocol was performed immediately prior to the procedure.  Using sterile technique and 1% Lidocaine as local anesthetic, under stereotactic guidance, a 9 gauge vacuum assist device was used to perform core needle biopsy of 2 areas of architectural distortion in the upper inner left breast using a cranial to caudal approach.  At the conclusion of the procedure, a ribbon tissue marker clip was deployed into the more anterior biopsy cavity and a coil shaped biopsy clip deployed into the more posterior biopsy cavity, both well-positioned. Follow-up 2-view mammogram was performed and dictated separately.  IMPRESSION: Stereotactic-guided biopsy of 2 adjacent areas of architectural distortion in the upper inner left breast. No apparent complications.  Electronically Signed: By: Lajean Manes M.D. On: 05/03/2015 10:49   Mm Lt Breast Bx W Loc Dev Ea Ad Lesion Img Bx Spec Stereo Guide  05/08/2015   ADDENDUM REPORT: 05/08/2015 08:52  ADDENDUM: Pathology revealed invasive mammary carcinoma and mammary carcinoma in situ, likely lobular in the upper inner quadrant of the left breast. There was a second lesion in  the upper inner quadrant of the left breast, 1 cm posterior, also showing invasive mammary carcinoma and mammary carcinoma in situ, likely lobular. This was found to be concordant by Dr. Lajean Manes. Pathology was discussed with the patient by telephone. She reported doing well after the biopsy with marked tenderness in the left breast. Post biopsy instructions and care were reviewed and her questions were answered. She has been scheduled for [facility] on May 09, 2015. A bilateral breast MRI has been scheduled for May 08, 2015. My number was provided for additional questions and concerns.  Pathology results reported by Susa Raring RN, BSN on May 07, 2015.   Electronically Signed   By: Lajean Manes M.D.   On: 05/08/2015 08:52   05/08/2015   CLINICAL DATA:  Patient presents for stereotactic core needle biopsy of 2 adjacent areas of architectural distortion in the medial left breast, slightly above midline. Patient underwent a recent biopsy of a similar area of distortion in the right breast, which revealed a complex sclerosing lesion and atypical lobular hyperplasia.  EXAM: BREAST STEREOTACTIC CORE NEEDLE BIOPSY  COMPARISON:  Previous exams.  FINDINGS: The patient and I discussed the procedure of stereotactic-guided biopsy including benefits and alternatives. We discussed the high likelihood  of a successful procedure. We discussed the risks of the procedure including infection, bleeding, tissue injury, clip migration, and inadequate sampling. Informed written consent was given. The usual time out protocol was performed immediately prior to the procedure.  Using sterile technique and 1% Lidocaine as local anesthetic, under stereotactic guidance, a 9 gauge vacuum assist device was used to perform core needle biopsy of 2 areas of architectural distortion in the upper inner left breast using a cranial to caudal approach.  At the conclusion of the procedure, a ribbon tissue  marker clip was deployed into the more anterior biopsy cavity and a coil shaped biopsy clip deployed into the more posterior biopsy cavity, both well-positioned. Follow-up 2-view mammogram was performed and dictated separately.  IMPRESSION: Stereotactic-guided biopsy of 2 adjacent areas of architectural distortion in the upper inner left breast. No apparent complications.  Electronically Signed: By: Lajean Manes M.D. On: 05/03/2015 10:49   Mm Rt Breast Bx W Loc Dev 1st Lesion Image Bx Spec Stereo Guide  05/07/2015   ADDENDUM REPORT: 05/07/2015 07:48  ADDENDUM: Pathology revealed invasive mammary carcinoma and mammary carcinoma in situ, likely lobular in the upper inner quadrant of the left breast. There was a second lesion in the upper inner quadrant of the left breast, 1 cm posterior, also showing invasive mammary carcinoma and mammary carcinoma in situ, likely lobular. This was found to be concordant by Dr. Lajean Manes. Pathology was discussed with the patient by telephone. She reported doing well after the biopsy with marked tenderness in the left breast. Post biopsy instructions and care were reviewed and her questions were answered. She has been scheduled for [facility] on May 09, 2015. A bilateral breast MRI has been scheduled for May 08, 2015. My number was provided for additional questions and concerns.  Pathology results reported by Susa Raring RN, BSN on May 07, 2015.   Electronically Signed   By: Lajean Manes M.D.   On: 05/07/2015 07:48   05/01/2015   ADDENDUM REPORT: 05/01/2015 15:20  ADDENDUM: Final pathology demonstrates COMPLEX SCLEROSING LESION and ALH.  Histology correlates with imaging findings.  The patient was contacted by phone on 05/01/2015 and these results given to her which she understood. Her questions were answered.  The patient had no complaints with her biopsy site.  It was initially discussed with the patient to perform breast MRI for  further evaluation of left breast distortion. However, after further discussion with the patient, she is claustrophobic and feels she will be unable to tolerate the MR. Also, she questions the necessity of the MRI.  Therefore we will reschedule the left stereotactic/tomographic guided biopsies of 2 areas of left breast distortion.  Recommend stereotactic/tomographic guided biopsies of the left breast, which will be scheduled by our office and the patient contacted.   Electronically Signed   By: Margarette Canada M.D.   On: 05/01/2015 15:20   05/07/2015   CLINICAL DATA:  Right breast lower outer quadrant distortion  EXAM: Right BREAST STEREOTACTIC CORE NEEDLE BIOPSY  COMPARISON:  Previous exams.  FINDINGS: The patient and I discussed the procedure of stereotactic-guided biopsy including benefits and alternatives. We discussed the high likelihood of a successful procedure. We discussed the risks of the procedure including infection, bleeding, tissue injury, clip migration, and inadequate sampling. Informed written consent was given. The usual time out protocol was performed immediately prior to the procedure. Risks of bleeding were previously discussed with the patient by Dr. Owens Shark because the patient  takes aspirin and clopidogrel.  Using sterile technique and 2% Lidocaine as local anesthetic, under stereotactic guidance, a 12 gauge core vacuum needle device was used to perform core needle biopsy of distortion in the right lower outer quadrant using a superior to inferior approach. Specimen radiograph was not performed.  At the conclusion of the procedure, a X shaped tissue marker clip was deployed into the biopsy cavity. Follow-up 2-view mammogram was performed and dictated separately.  IMPRESSION: Stereotactic-guided biopsy of right lower outer quadrant distortion with X shaped clip placement. No apparent complications.  I reviewed the prior exams and discussed the presence of at least 2 additional areas of distortion in  the left breast with the patient at length. MRI is recommended of both breasts with contrast for further evaluation to be scheduled after today's biopsy. If no additional area of abnormal enhancement is identified separate from the 2 mammographically detected areas of left breast distortion, then MRI guided biopsy of both areas of distortion in the left breast would be recommended. If neither of the 2 areas of distortion is detected at MRI, then 3D stereotactically guided biopsy of both areas of distortion would be recommended. Due to lidocaine dosing and potential toxicity, 3 biopsy locations cannot be performed today therefore only the right breast is biopsied today. If areas of abnormal enhancement are identified separate from the mammographically detected left breast distortion, then biopsy of the additional areas under MRI guidance could be performed depending on the imaging features of the MRI detected lesions. If both areas of distortion are identified at MRI of the left breast, then these would likely be more amenable to MRI guided biopsy because the biopsy device provides a larger aperture then is available with 3D biopsy technology. I discussed these recommendations at length with the patient and she concurs with the plan.  Electronically Signed: By: Conchita Paris M.D. On: 04/27/2015 10:40    ASSESSMENT: 81 y.o. Starling Manns woman status post left breast biopsy 05/03/2015 of 2 separate masses, clinically T2 N0, stage IIA invasive lobular breast cancer, grade 2, estrogen receptor positive, progesterone receptor 1% "positive", HER-2 not amplified  (a) right breast biopsy 04/27/2015 shows atypical lobular hyperplasia/complex sclerotic lesion  (1) left lumpectomy without sentinel lymph node sampling and biopsy of the right breast clinic  (2) radiation to follow surgery  (3) anastrozole to follow radiation   PLAN: We spent the better part of today's hour-long appointment discussing the biology of  breast cancer in general, and the specifics of the patient's tumor in particular. Clarene Critchley understands the difference between local and systemic therapy for breast cancer. We discussed whether a sentinel lymph node biopsy would be needed, but it was felt in the NA would not affect treatment choices. From a local point of view the patient will have bilateral lumpectomies, with the understanding there may be some moderate cosmetic defect on the left side given the size and location of the breast masses. Radiation will then follow as appropriate  We also discussed the fact that lobular breast cancer can be difficult to detect and that it is not infrequent to have positive margins at the time of surgery. Given the patient's history of very dense breasts, it may be a good idea to obtain breast MRIs yearly for the next 3 years at a minimum  From a systemic point of view, given the low proliferation fraction of the invasive tumor as well as the patient's age I did not feel chemotherapy was warranted. She would not be  a good candidate for tamoxifen given her history of transient ischemic attack. Accordingly the plan will be to go with anastrozole. We briefly discussed the possible toxicities, side effects and complications of this agent, and we will discuss this in more detail at the return visit.  Haneefah will return to see me once she has recovered from her local treatment. The plan will be for anastrozole for 5 years. She has a good understanding of the overall plan. She agrees with it. She knows the goal of treatment in her case is cure. She will call with any problems that may develop before her next visit here.  Chauncey Cruel, MD   05/09/2015 3:16 PM Medical Oncology and Hematology [facility] [address] [place], [place] [address] Tel. [phone]    Fax. [phone]

## 2015-05-09 NOTE — Progress Notes (Signed)
Radiation Oncology         [phone] ________________________________  Initial outpatient Consultation  Name: Courtney Shea MRN: [id number]  Date: 05/09/2015  DOB: 1929/12/28  DP:TELMR,AJHHID NEVILL, MD  Erroll Luna, MD   REFERRING PHYSICIAN: Erroll Luna, MD  DIAGNOSIS:    ICD-9-CM ICD-10-CM   1. Breast cancer of lower-outer quadrant of right female breast 174.5 C50.511   Multi focal invasive mammary carcinoma of the left breast consistent with lobular phenotype, ER 95% PR 1% HER-2 (-), Stage T2N0M0, Stage II    HISTORY OF PRESENT ILLNESS::Courtney Shea is a 81 y.o. female who presented with bilateral breast distortion on screening mammogram. Date of study was 04/16/15. This demonstrated two upper inner quadrant left breast areas of distortion and an area of distortion in the right lower outer quadrant. Biopsy of the right breast lesion on 04/27/15 revealed atypical lobular hyperplasia and a complex sclerosing lesion. On 05/03/15, she underwent biopsy of the two lesions in the left breast. The lesions were morphologically identical, revealing pathology as described above in the diagnosis, as well as in situ mammary carcinoma.   She underwent an MRI of her breasts after biopsy which revealed post biopsy changes in the right breast as well as left breast areas of enhancement measuing 2.5 cm and 1.3 cm, both of which corresponded with her prior biopsies. There was also a level 1 left axillary node with mild focal cortical thickening.   She was on hormonal replacement for 20+ years and stopped 2 years ago. She was taken off of it due to a TIA.       PAST MEDICAL HISTORY:  has a past medical history of Hypertension; Hypothyroid; GERD (gastroesophageal reflux disease); Syncope; Ventricular bigeminy; Stroke; Breast cancer of lower-outer quadrant of right female breast (05/07/2015); and Breast cancer.    PAST SURGICAL HISTORY: Past Surgical History  Procedure Laterality Date  .  Abdominal hysterectomy    . Ovarian cyst removed    . Incontinence surgery    . Bladder suspension    . Cataract extraction      FAMILY HISTORY: family history includes Breast cancer (age of onset: 12) in her sister; Coronary artery disease in her mother.  SOCIAL HISTORY:  reports that she has never smoked. She has never used smokeless tobacco. She reports that she does not drink alcohol or use illicit drugs.  ALLERGIES: Codeine; Sulfa antibiotics; and Hydrochlorothiazide  MEDICATIONS:  Current Outpatient Prescriptions  Medication Sig Dispense Refill  . amLODipine (NORVASC) 5 MG tablet Take 5 mg by mouth daily.    Marland Kitchen aspirin EC 81 MG tablet Take 81 mg by mouth every evening.    . Calcium Carbonate-Vitamin D (CALCIUM 600 + D PO) Take 1 tablet by mouth every evening.     . Cholecalciferol (VITAMIN D) 2000 UNITS tablet Take 2,000 Units by mouth every evening.    . clopidogrel (PLAVIX) 75 MG tablet Take 75 mg by mouth daily.    . furosemide (LASIX) 20 MG tablet Take 20 mg by mouth See admin instructions. Mon. Wed, Friday    . levothyroxine (SYNTHROID, LEVOTHROID) 88 MCG tablet Take 88 mcg by mouth daily.    Marland Kitchen losartan (COZAAR) 100 MG tablet Take 100 mg by mouth daily.    . metoprolol succinate (TOPROL-XL) 50 MG 24 hr tablet Take 50 mg by mouth daily. Take with or immediately following a meal.    . pantoprazole (PROTONIX) 40 MG tablet Take 40 mg by mouth every evening.    Marland Kitchen  sennosides-docusate sodium (SENOKOT-S) 8.6-50 MG tablet Take 1 tablet by mouth daily as needed for constipation.      No current facility-administered medications for this encounter.    REVIEW OF SYSTEMS:  Notable for that above.   PHYSICAL EXAM:  Vitals with BMI 05/09/2015  Height 5' 4"  Weight 169 lbs  BMI 83.8  Systolic 184  Diastolic 74  Pulse 69  Respirations 18   General: Alert and oriented, in no acute distress HEENT: Head is normocephalic. Extraocular movements are intact. Oropharynx is clear. Neck:  Neck is supple, no palpable cervical or supraclavicular lymphadenopathy. Heart: Regular in rate and rhythm with no murmurs, rubs, or gallops. Chest: Clear to auscultation bilaterally, with no rhonchi, wheezes, or rales. Abdomen: Soft, nontender, nondistended, with no rigidity or guarding. Extremities: No cyanosis or edema. Lymphatics: see Neck Exam Skin: No concerning lesions. Musculoskeletal: symmetric strength and muscle tone throughout. Neurologic: Cranial nerves II through XII are grossly intact. No obvious focalities. Speech is fluent. Coordination is intact. Psychiatric: Judgment and insight are intact. Affect is appropriate. Breast: She has post biopsy bandages at the 11 o'clock position of both breasts with associated post biopsy bruising in the breasts bilaterally. No palpable axillary lymph nodes.    ECOG = 1  0 - Asymptomatic (Fully active, able to carry on all predisease activities without restriction)  1 - Symptomatic but completely ambulatory (Restricted in physically strenuous activity but ambulatory and able to carry out work of a light or sedentary nature. For example, light housework, office work)  2 - Symptomatic, <50% in bed during the day (Ambulatory and capable of all self care but unable to carry out any work activities. Up and about more than 50% of waking hours)  3 - Symptomatic, >50% in bed, but not bedbound (Capable of only limited self-care, confined to bed or chair 50% or more of waking hours)  4 - Bedbound (Completely disabled. Cannot carry on any self-care. Totally confined to bed or chair)  5 - Death   Eustace Pen MM, Creech RH, Tormey DC, et al. [phone]). "Toxicity and response criteria of the [facility]". [place] Oncol. 5 (6): 649-55   LABORATORY DATA:  Lab Results  Component Value Date   WBC 7.7 05/09/2015   HGB 13.2 05/09/2015   HCT 39.3 05/09/2015   MCV 89.9 05/09/2015   PLT 251 05/09/2015   CMP     Component Value  Date/Time   NA 140 05/09/2015 1220   NA 141 06/20/2013 0830   K 3.8 05/09/2015 1220   K 3.4* 06/20/2013 0830   CL 102 06/20/2013 0830   CO2 27 05/09/2015 1220   CO2 27 06/20/2013 0830   GLUCOSE 96 05/09/2015 1220   GLUCOSE 119* 06/20/2013 0830   BUN 17.3 05/09/2015 1220   BUN 17 06/20/2013 0830   CREATININE 0.8 05/09/2015 1220   CREATININE 0.67 06/20/2013 0830   CALCIUM 9.8 05/09/2015 1220   CALCIUM 9.6 06/20/2013 0830   PROT 7.3 05/09/2015 1220   PROT 7.4 06/20/2013 0830   ALBUMIN 4.2 05/09/2015 1220   ALBUMIN 3.9 06/20/2013 0830   AST 38* 05/09/2015 1220   AST 25 06/20/2013 0830   ALT 52 05/09/2015 1220   ALT 30 06/20/2013 0830   ALKPHOS 86 05/09/2015 1220   ALKPHOS 66 06/20/2013 0830   BILITOT 0.58 05/09/2015 1220   BILITOT 0.3 06/20/2013 0830   GFRNONAA 81* 06/20/2013 0830   GFRAA >90 06/20/2013 0830  RADIOGRAPHY: Mr Breast Bilateral W Wo Contrast  05/08/2015   CLINICAL DATA:  81 year old female with invasive mammary carcinoma and mammary carcinoma newly diagnosed in 2 separate areas of the upper inner LEFT breast. Also newly diagnosed complex sclerosing lesion and atypical lobular hyperplasia within the lower outer RIGHT breast.  LABS:  BUN and creatinine were obtained on site at [place] at  [address]  Results:  BUN 22 mg/dL,  Creatinine 0.8 mg/dL.  EXAM: BILATERAL BREAST MRI WITH AND WITHOUT CONTRAST  TECHNIQUE: Multiplanar, multisequence MR images of both breasts were obtained prior to and following the intravenous administration of 15 ml of MultiHance.  THREE-DIMENSIONAL MR IMAGE RENDERING ON INDEPENDENT WORKSTATION:  Three-dimensional MR images were rendered by post-processing of the original MR data on an independent workstation. The three-dimensional MR images were interpreted, and findings are reported in the following complete MRI report for this study. Three dimensional images were evaluated at the independent DynaCad workstation   COMPARISON:  Recent mammograms.  FINDINGS: Breast composition: d. Extreme fibroglandular tissue.  Background parenchymal enhancement: Mild  Right breast: Biopsy clip artifact within the retroareolar right breast is identified. Post biopsy changes within the retroareolar and anterior slightly lower outer right breast are identified. No other abnormal areas of enhancement are identified.  Left breast: A 2.5 x 3.2 x 3 cm area of ill-defined enhancement within the upper inner left breast, middle third, is noted with biopsy clip artifact in the center of this area, compatible with post biopsy changes/ biopsy-proven malignancy.  A 1.2 x 1.3 x 1 cm area of ill-defined enhancement with biopsy clip artifact within the upper inner left breast at the junction of the anterior and middle thirds is noted compatible with post biopsy changes/ biopsy-proven malignancy.  No other abnormal areas of enhancement are identified.  Lymph nodes: A level 1 left axillary lymph node has mild focal cortical thickening medially. No other abnormal appearing lymph nodes are identified.  Ancillary findings:  None.  IMPRESSION: Two areas of ill-defined enhancement within the upper inner left breast compatible with post biopsy changes/ biopsy-proven malignancy. The posterior area measures 2.5 x 3.2 x 3 cm and anterior area measures 1.2 x 1.3 x 1 cm. The biopsy clips are separated by a distance of approximately 4 cm craniocaudally and 3 cm AP.  One level 1 left axillary lymph node with mild focal cortical thickening medially -indeterminate.  Post biopsy changes within the anterior right breast in the area of biopsy-proven complex sclerosing lesion/ALH.  RECOMMENDATION: Treatment plan.  BI-RADS CATEGORY  6: Known biopsy-proven malignancy.   Electronically Signed   By: Margarette Canada M.D.   On: 05/08/2015 09:53   US Breast Ltd Uni Left Inc Axilla  04/16/2015   CLINICAL DATA:  Patient presents for 2 year follow-up of probable fibroadenomas in the left  breast.  EXAM: DIGITAL DIAGNOSTIC BILATERAL MAMMOGRAM WITH 3D TOMOSYNTHESIS WITH CAD  ULTRASOUND BILATERAL BREAST  COMPARISON:  Multiple prior studies including 04/11/2014  ACR Breast Density Category d: The breast tissue is extremely dense, which lowers the sensitivity of mammography.  FINDINGS: Within the right breast there is an area of distortion in the lower outer quadrant of the right breast associated with faint calcifications. No definitive mass is associated with this distortion.  Within the upper inner quadrant of the left breast there are at least 2 sites of distortion.  Mammographic images were processed with CAD.  On physical exam, I palpate no abnormality in the lower outer quadrant of  the right breast. I palpate no abnormality in the upper inner quadrant of the left breast.  Targeted ultrasound is performed, showing an area of vague distortion in the 8 o'clock location of the right breast 2 cm from the nipple. This measures 0.9 x 0.8 x 0.7 cm. Although the sonographic abnormality is felt to confirm the mammographic abnormality, targeting for biopsy would likely be more simple with mammographic guidance. Evaluation of the right axilla is negative for adenopathy.  Evaluation of the upper inner quadrant of the left breast reveals extremely dense fibroglandular tissue with some vague distortion. However, no discrete areas are identified to guide biopsy. Evaluation of the left axilla is negative for adenopathy.  Within the 3 o'clock location of the left breast 6 cm from the nipple a small hypoechoic oval nodule measures 0.7 x 0.4 x 0.6 cm. In the 2 o'clock location 3 cm from the nipple a small oval hypoechoic nodule is 0.8 x 0.5 x 0.8 cm. Findings are consistent benign fibroadenomas.  IMPRESSION: 1. Stable appearance of left breast fibroadenomas consistent benign process. 2. In the lower outer quadrant of the right breast there is an area of suspicious distortion. Although confirmed with ultrasound,  targeting with stereotactic guidance is recommended. 3. Within the upper inner quadrant of the left breast there are at least 2 areas of distortion. No definitive ultrasound correlate identified.  RECOMMENDATION: 1. Stereotactic guided core biopsy of distortion within the lower outer quadrant of the right breast. 2. Stereotactic guided core biopsy of 1 of the areas of distortion within the upper inner quadrant of the left breast. Subsequent MRI may be helpful for evaluating other areas of concern. Bilateral stereotactic guided core biopsies are scheduled for the patient on 04/27/2015 at 10 o'clock a.m.  I have discussed the findings and recommendations with the patient. Results were also provided in writing at the conclusion of the visit. If applicable, a reminder letter will be sent to the patient regarding the next appointment.  BI-RADS CATEGORY  4: Suspicious.   Electronically Signed   By: Nolon Nations M.D.   On: 04/16/2015 16:31   US Breast Ltd Uni Right Inc Axilla  04/16/2015   CLINICAL DATA:  Patient presents for 2 year follow-up of probable fibroadenomas in the left breast.  EXAM: DIGITAL DIAGNOSTIC BILATERAL MAMMOGRAM WITH 3D TOMOSYNTHESIS WITH CAD  ULTRASOUND BILATERAL BREAST  COMPARISON:  Multiple prior studies including 04/11/2014  ACR Breast Density Category d: The breast tissue is extremely dense, which lowers the sensitivity of mammography.  FINDINGS: Within the right breast there is an area of distortion in the lower outer quadrant of the right breast associated with faint calcifications. No definitive mass is associated with this distortion.  Within the upper inner quadrant of the left breast there are at least 2 sites of distortion.  Mammographic images were processed with CAD.  On physical exam, I palpate no abnormality in the lower outer quadrant of the right breast. I palpate no abnormality in the upper inner quadrant of the left breast.  Targeted ultrasound is performed, showing an area  of vague distortion in the 8 o'clock location of the right breast 2 cm from the nipple. This measures 0.9 x 0.8 x 0.7 cm. Although the sonographic abnormality is felt to confirm the mammographic abnormality, targeting for biopsy would likely be more simple with mammographic guidance. Evaluation of the right axilla is negative for adenopathy.  Evaluation of the upper inner quadrant of the left breast reveals extremely dense fibroglandular tissue  with some vague distortion. However, no discrete areas are identified to guide biopsy. Evaluation of the left axilla is negative for adenopathy.  Within the 3 o'clock location of the left breast 6 cm from the nipple a small hypoechoic oval nodule measures 0.7 x 0.4 x 0.6 cm. In the 2 o'clock location 3 cm from the nipple a small oval hypoechoic nodule is 0.8 x 0.5 x 0.8 cm. Findings are consistent benign fibroadenomas.  IMPRESSION: 1. Stable appearance of left breast fibroadenomas consistent benign process. 2. In the lower outer quadrant of the right breast there is an area of suspicious distortion. Although confirmed with ultrasound, targeting with stereotactic guidance is recommended. 3. Within the upper inner quadrant of the left breast there are at least 2 areas of distortion. No definitive ultrasound correlate identified.  RECOMMENDATION: 1. Stereotactic guided core biopsy of distortion within the lower outer quadrant of the right breast. 2. Stereotactic guided core biopsy of 1 of the areas of distortion within the upper inner quadrant of the left breast. Subsequent MRI may be helpful for evaluating other areas of concern. Bilateral stereotactic guided core biopsies are scheduled for the patient on 04/27/2015 at 10 o'clock a.m.  I have discussed the findings and recommendations with the patient. Results were also provided in writing at the conclusion of the visit. If applicable, a reminder letter will be sent to the patient regarding the next appointment.  BI-RADS  CATEGORY  4: Suspicious.   Electronically Signed   By: Nolon Nations M.D.   On: 04/16/2015 16:31   Mm Diag Breast Tomo Uni Left  05/03/2015   CLINICAL DATA:  Status post stereotactic core needle biopsy of 2 areas of architectural distortion in the upper inner left breast.  EXAM: DIAGNOSTIC LEFT MAMMOGRAM POST STEREOTACTIC BIOPSY  COMPARISON:  Previous exam(s).  FINDINGS: Mammographic images were obtained following stereotactic guided biopsy of 2 adjacent areas of architectural distortion in the upper inner left breast. The X shaped biopsy clip, which corresponds the more anterior of the 2 lesions, and the more posterior coil shaped biopsy clip, are both positioned in the areas of architectural distortion.  IMPRESSION: Well-positioned biopsy clips following stereotactic core needle biopsy of 2 areas of architectural distortion in the left breast.  Final Assessment: Post Procedure Mammograms for Marker Placement   Electronically Signed   By: Lajean Manes M.D.   On: 05/03/2015 10:53   Mm Diag Breast Tomo Uni Right  04/27/2015   CLINICAL DATA:  3D guided stereotactic biopsy right lower outer quadrant distortion  EXAM: DIAGNOSTIC right MAMMOGRAM POST 3D stereotactic BIOPSY  COMPARISON:  Previous exam(s).  FINDINGS: Mammographic images were obtained following 3D stereotactic guided biopsy of right lower outer quadrant distortion. The X shaped clip is appropriately located.  IMPRESSION: Appropriate X shaped clip location, right breast lower outer quadrant distortion.  Final Assessment: Post Procedure Mammograms for Marker Placement   Electronically Signed   By: Conchita Paris M.D.   On: 04/27/2015 10:57   Mm Diag Breast Tomo Bilateral  04/16/2015   CLINICAL DATA:  Patient presents for 2 year follow-up of probable fibroadenomas in the left breast.  EXAM: DIGITAL DIAGNOSTIC BILATERAL MAMMOGRAM WITH 3D TOMOSYNTHESIS WITH CAD  ULTRASOUND BILATERAL BREAST  COMPARISON:  Multiple prior studies including 04/11/2014   ACR Breast Density Category d: The breast tissue is extremely dense, which lowers the sensitivity of mammography.  FINDINGS: Within the right breast there is an area of distortion in the lower outer quadrant of the right breast  associated with faint calcifications. No definitive mass is associated with this distortion.  Within the upper inner quadrant of the left breast there are at least 2 sites of distortion.  Mammographic images were processed with CAD.  On physical exam, I palpate no abnormality in the lower outer quadrant of the right breast. I palpate no abnormality in the upper inner quadrant of the left breast.  Targeted ultrasound is performed, showing an area of vague distortion in the 8 o'clock location of the right breast 2 cm from the nipple. This measures 0.9 x 0.8 x 0.7 cm. Although the sonographic abnormality is felt to confirm the mammographic abnormality, targeting for biopsy would likely be more simple with mammographic guidance. Evaluation of the right axilla is negative for adenopathy.  Evaluation of the upper inner quadrant of the left breast reveals extremely dense fibroglandular tissue with some vague distortion. However, no discrete areas are identified to guide biopsy. Evaluation of the left axilla is negative for adenopathy.  Within the 3 o'clock location of the left breast 6 cm from the nipple a small hypoechoic oval nodule measures 0.7 x 0.4 x 0.6 cm. In the 2 o'clock location 3 cm from the nipple a small oval hypoechoic nodule is 0.8 x 0.5 x 0.8 cm. Findings are consistent benign fibroadenomas.  IMPRESSION: 1. Stable appearance of left breast fibroadenomas consistent benign process. 2. In the lower outer quadrant of the right breast there is an area of suspicious distortion. Although confirmed with ultrasound, targeting with stereotactic guidance is recommended. 3. Within the upper inner quadrant of the left breast there are at least 2 areas of distortion. No definitive ultrasound  correlate identified.  RECOMMENDATION: 1. Stereotactic guided core biopsy of distortion within the lower outer quadrant of the right breast. 2. Stereotactic guided core biopsy of 1 of the areas of distortion within the upper inner quadrant of the left breast. Subsequent MRI may be helpful for evaluating other areas of concern. Bilateral stereotactic guided core biopsies are scheduled for the patient on 04/27/2015 at 10 o'clock a.m.  I have discussed the findings and recommendations with the patient. Results were also provided in writing at the conclusion of the visit. If applicable, a reminder letter will be sent to the patient regarding the next appointment.  BI-RADS CATEGORY  4: Suspicious.   Electronically Signed   By: Nolon Nations M.D.   On: 04/16/2015 16:31   Mm Lt Breast Bx W Loc Dev 1st Lesion Image Bx Spec Stereo Guide  05/08/2015   ADDENDUM REPORT: 05/08/2015 08:52  ADDENDUM: Pathology revealed invasive mammary carcinoma and mammary carcinoma in situ, likely lobular in the upper inner quadrant of the left breast. There was a second lesion in the upper inner quadrant of the left breast, 1 cm posterior, also showing invasive mammary carcinoma and mammary carcinoma in situ, likely lobular. This was found to be concordant by Dr. Lajean Manes. Pathology was discussed with the patient by telephone. She reported doing well after the biopsy with marked tenderness in the left breast. Post biopsy instructions and care were reviewed and her questions were answered. She has been scheduled for [facility] on May 09, 2015. A bilateral breast MRI has been scheduled for May 08, 2015. My number was provided for additional questions and concerns.  Pathology results reported by Susa Raring RN, BSN on May 07, 2015.   Electronically Signed   By: Lajean Manes M.D.   On: 05/08/2015 08:52   05/08/2015  CLINICAL DATA:  Patient presents for stereotactic core needle biopsy of 2  adjacent areas of architectural distortion in the medial left breast, slightly above midline. Patient underwent a recent biopsy of a similar area of distortion in the right breast, which revealed a complex sclerosing lesion and atypical lobular hyperplasia.  EXAM: BREAST STEREOTACTIC CORE NEEDLE BIOPSY  COMPARISON:  Previous exams.  FINDINGS: The patient and I discussed the procedure of stereotactic-guided biopsy including benefits and alternatives. We discussed the high likelihood of a successful procedure. We discussed the risks of the procedure including infection, bleeding, tissue injury, clip migration, and inadequate sampling. Informed written consent was given. The usual time out protocol was performed immediately prior to the procedure.  Using sterile technique and 1% Lidocaine as local anesthetic, under stereotactic guidance, a 9 gauge vacuum assist device was used to perform core needle biopsy of 2 areas of architectural distortion in the upper inner left breast using a cranial to caudal approach.  At the conclusion of the procedure, a ribbon tissue marker clip was deployed into the more anterior biopsy cavity and a coil shaped biopsy clip deployed into the more posterior biopsy cavity, both well-positioned. Follow-up 2-view mammogram was performed and dictated separately.  IMPRESSION: Stereotactic-guided biopsy of 2 adjacent areas of architectural distortion in the upper inner left breast. No apparent complications.  Electronically Signed: By: Lajean Manes M.D. On: 05/03/2015 10:49   Mm Lt Breast Bx W Loc Dev Ea Ad Lesion Img Bx Spec Stereo Guide  05/08/2015   ADDENDUM REPORT: 05/08/2015 08:52  ADDENDUM: Pathology revealed invasive mammary carcinoma and mammary carcinoma in situ, likely lobular in the upper inner quadrant of the left breast. There was a second lesion in the upper inner quadrant of the left breast, 1 cm posterior, also showing invasive mammary carcinoma and mammary carcinoma in situ,  likely lobular. This was found to be concordant by Dr. Lajean Manes. Pathology was discussed with the patient by telephone. She reported doing well after the biopsy with marked tenderness in the left breast. Post biopsy instructions and care were reviewed and her questions were answered. She has been scheduled for [facility] on May 09, 2015. A bilateral breast MRI has been scheduled for May 08, 2015. My number was provided for additional questions and concerns.  Pathology results reported by Susa Raring RN, BSN on May 07, 2015.   Electronically Signed   By: Lajean Manes M.D.   On: 05/08/2015 08:52   05/08/2015   CLINICAL DATA:  Patient presents for stereotactic core needle biopsy of 2 adjacent areas of architectural distortion in the medial left breast, slightly above midline. Patient underwent a recent biopsy of a similar area of distortion in the right breast, which revealed a complex sclerosing lesion and atypical lobular hyperplasia.  EXAM: BREAST STEREOTACTIC CORE NEEDLE BIOPSY  COMPARISON:  Previous exams.  FINDINGS: The patient and I discussed the procedure of stereotactic-guided biopsy including benefits and alternatives. We discussed the high likelihood of a successful procedure. We discussed the risks of the procedure including infection, bleeding, tissue injury, clip migration, and inadequate sampling. Informed written consent was given. The usual time out protocol was performed immediately prior to the procedure.  Using sterile technique and 1% Lidocaine as local anesthetic, under stereotactic guidance, a 9 gauge vacuum assist device was used to perform core needle biopsy of 2 areas of architectural distortion in the upper inner left breast using a cranial to caudal approach.  At the conclusion  of the procedure, a ribbon tissue marker clip was deployed into the more anterior biopsy cavity and a coil shaped biopsy clip deployed into the more posterior biopsy  cavity, both well-positioned. Follow-up 2-view mammogram was performed and dictated separately.  IMPRESSION: Stereotactic-guided biopsy of 2 adjacent areas of architectural distortion in the upper inner left breast. No apparent complications.  Electronically Signed: By: Lajean Manes M.D. On: 05/03/2015 10:49   Mm Rt Breast Bx W Loc Dev 1st Lesion Image Bx Spec Stereo Guide  05/07/2015   ADDENDUM REPORT: 05/07/2015 07:48  ADDENDUM: Pathology revealed invasive mammary carcinoma and mammary carcinoma in situ, likely lobular in the upper inner quadrant of the left breast. There was a second lesion in the upper inner quadrant of the left breast, 1 cm posterior, also showing invasive mammary carcinoma and mammary carcinoma in situ, likely lobular. This was found to be concordant by Dr. Lajean Manes. Pathology was discussed with the patient by telephone. She reported doing well after the biopsy with marked tenderness in the left breast. Post biopsy instructions and care were reviewed and her questions were answered. She has been scheduled for [facility] on May 09, 2015. A bilateral breast MRI has been scheduled for May 08, 2015. My number was provided for additional questions and concerns.  Pathology results reported by Susa Raring RN, BSN on May 07, 2015.   Electronically Signed   By: Lajean Manes M.D.   On: 05/07/2015 07:48   05/01/2015   ADDENDUM REPORT: 05/01/2015 15:20  ADDENDUM: Final pathology demonstrates COMPLEX SCLEROSING LESION and ALH.  Histology correlates with imaging findings.  The patient was contacted by phone on 05/01/2015 and these results given to her which she understood. Her questions were answered.  The patient had no complaints with her biopsy site.  It was initially discussed with the patient to perform breast MRI for further evaluation of left breast distortion. However, after further discussion with the patient, she is claustrophobic and feels she  will be unable to tolerate the MR. Also, she questions the necessity of the MRI.  Therefore we will reschedule the left stereotactic/tomographic guided biopsies of 2 areas of left breast distortion.  Recommend stereotactic/tomographic guided biopsies of the left breast, which will be scheduled by our office and the patient contacted.   Electronically Signed   By: Margarette Canada M.D.   On: 05/01/2015 15:20   05/07/2015   CLINICAL DATA:  Right breast lower outer quadrant distortion  EXAM: Right BREAST STEREOTACTIC CORE NEEDLE BIOPSY  COMPARISON:  Previous exams.  FINDINGS: The patient and I discussed the procedure of stereotactic-guided biopsy including benefits and alternatives. We discussed the high likelihood of a successful procedure. We discussed the risks of the procedure including infection, bleeding, tissue injury, clip migration, and inadequate sampling. Informed written consent was given. The usual time out protocol was performed immediately prior to the procedure. Risks of bleeding were previously discussed with the patient by Dr. Owens Shark because the patient takes aspirin and clopidogrel.  Using sterile technique and 2% Lidocaine as local anesthetic, under stereotactic guidance, a 12 gauge core vacuum needle device was used to perform core needle biopsy of distortion in the right lower outer quadrant using a superior to inferior approach. Specimen radiograph was not performed.  At the conclusion of the procedure, a X shaped tissue marker clip was deployed into the biopsy cavity. Follow-up 2-view mammogram was performed and dictated separately.  IMPRESSION: Stereotactic-guided biopsy of right lower outer quadrant  distortion with X shaped clip placement. No apparent complications.  I reviewed the prior exams and discussed the presence of at least 2 additional areas of distortion in the left breast with the patient at length. MRI is recommended of both breasts with contrast for further evaluation to be scheduled  after today's biopsy. If no additional area of abnormal enhancement is identified separate from the 2 mammographically detected areas of left breast distortion, then MRI guided biopsy of both areas of distortion in the left breast would be recommended. If neither of the 2 areas of distortion is detected at MRI, then 3D stereotactically guided biopsy of both areas of distortion would be recommended. Due to lidocaine dosing and potential toxicity, 3 biopsy locations cannot be performed today therefore only the right breast is biopsied today. If areas of abnormal enhancement are identified separate from the mammographically detected left breast distortion, then biopsy of the additional areas under MRI guidance could be performed depending on the imaging features of the MRI detected lesions. If both areas of distortion are identified at MRI of the left breast, then these would likely be more amenable to MRI guided biopsy because the biopsy device provides a larger aperture then is available with 3D biopsy technology. I discussed these recommendations at length with the patient and she concurs with the plan.  Electronically Signed: By: Conchita Paris M.D. On: 04/27/2015 10:40      IMPRESSION/PLAN: Multifocal left breast cancer.  I have advised for her to not go back onto any hormonal replacement with her current diagnosis.  She has been discussed at our multidisciplinary tumor board.  The consensus is that she would be a good candidate for breast conservation. I talked to her about the option of a mastectomy and informed her that her expected overall survival would be equivalent between mastectomy and breast conservation, based upon randomized controlled data. She is enthusiastic about breast conservation.  It was a pleasure meeting the patient today. We discussed the risks, benefits, and side effects of radiotherapy. We discussed that radiation would take approximately 4 weeks to complete and that I would give  the patient a few weeks to heal following surgery before starting treatment planning. We spoke about acute effects including skin irritation and fatigue as well as much less common late effects including lung and heart irritation. We spoke about the latest technology that is used to minimize the risk of late effects for breast cancer patients undergoing radiotherapy. No guarantees of treatment were given. The patient is enthusiastic about proceeding with treatment. I look forward to participating in the patient's care.  She will discuss hormonal therapy with medical oncology.   This document serves as a record of services personally performed by Eppie Gibson, MD. It was created on her behalf by Arlyce Harman, a trained medical scribe. The creation of this record is based on the scribe's personal observations and the provider's statements to them. This document has been checked and approved by the attending provider. __________________________________________   Eppie Gibson, MD

## 2015-05-09 NOTE — Progress Notes (Signed)
Courtney Shea is a very pleasant 81 y.o. female from [place], [place] with newly diagnosed grade 1-2 invasive mammary carcinoma of the left breast.  She was found to have ALH and complex sclerosing lesion of the right breast. Biopsy results revealed the invasive tumor's prognostic profile is ER positive, PR positive, and HER2/neu negative. Ki67 is 5%.  She presents today with her husband to the [facility] ([facility] for treatment consideration and recommendations from the breast surgeon, radiation oncologist, and medical oncologist.    I briefly met with Courtney Shea and her husband during her [facility] visit today. We discussed the purpose of the Survivorship Clinic, which will include monitoring for recurrence, coordinating completion of age and gender-appropriate cancer screenings, promotion of overall wellness, as well as managing potential late/long-term side effects of anti-cancer treatments.    The treatment plan for Courtney Shea will likely include surgery, radiation therapy, and anti-estrogen therapy.  As of today, the intent of treatment for Courtney Shea is cure, therefore she will be eligible for the Survivorship Clinic upon her completion of treatment.  Her survivorship care plan (SCP) document will be drafted and updated throughout the course of her treatment trajectory. She will receive the SCP in an office visit with myself in the Survivorship Clinic once she has completed treatment.   Courtney Shea was encouraged to ask questions and all questions were answered to her satisfaction.  She was given my business card and encouraged to contact me with any concerns regarding survivorship.  I look forward to participating in her care.   Gretchen Dawson, NP Survivorship Program [facility] [phone] 

## 2015-05-10 ENCOUNTER — Telehealth: Payer: Self-pay | Admitting: Oncology

## 2015-05-10 ENCOUNTER — Encounter: Payer: Self-pay | Admitting: General Practice

## 2015-05-10 NOTE — Telephone Encounter (Signed)
s.w. pt and advised on Sept appt pt ok and aware °

## 2015-05-10 NOTE — Progress Notes (Signed)
[facility] Psychosocial Distress Screening Spiritual Care  Spiritual Care was referred by distress screening protocol.  The patient scored a 8 on the Psychosocial Distress Thermometer which indicates severe distress. Unable to meet pt in [facility] or to reach by phone to assess for distress and other psychosocial needs; left voicemail.   ON[facility] DISTRESS SCREENING 05/10/2015  Screening Type Initial Screening  Distress experienced in past week (1-10) 8  Emotional problem type Nervousness/Anxiety;Adjusting to illness  Spiritual/Religous concerns type Facing my mortality  Information Concerns Type Lack of info about diagnosis;Lack of info about treatment  Physical Problem type Sleep/insomnia  Referral to support programs Yes  Other Spiritual Care    Follow up needed: Yes.  Will continue to try to reach by phone for assessment and support.  Please also page as needs arise.  Thank you.  Suzette Battiest, [place] Pager [phone] Voicemail [phone]

## 2015-05-11 ENCOUNTER — Ambulatory Visit: Payer: Self-pay | Admitting: Surgery

## 2015-05-15 ENCOUNTER — Other Ambulatory Visit: Payer: Self-pay

## 2015-05-15 ENCOUNTER — Telehealth: Payer: Self-pay | Admitting: *Deleted

## 2015-05-15 ENCOUNTER — Other Ambulatory Visit: Payer: Self-pay | Admitting: Surgery

## 2015-05-15 DIAGNOSIS — C50912 Malignant neoplasm of unspecified site of left female breast: Secondary | ICD-10-CM

## 2015-05-15 NOTE — Telephone Encounter (Signed)
Spoke to pt concerning [place] from 05/09/15. Denies questions or concerns regarding dx or treatment care plan. Encourage pt to call with needs. Received verbal understanding. Contact information given.

## 2015-05-17 ENCOUNTER — Encounter: Payer: Self-pay | Admitting: General Practice

## 2015-05-17 NOTE — Progress Notes (Signed)
Spiritual Care Note  Returned pt's call in response to my [place] follow-up call.  Courtney Shea reports great support from her husband, two children, three grandchildren (and three great grands); she finds help and comfort from her son-in-law and his oldest son, who are ministers.  She uses faith to cope, noting, "God has taken care of me all my life; I trust Him to get me through this, too."  Per pt, "my anxiety may increase as surgery gets closer, but I'm not thinking about it at this point"; moreover, her distress has decreased through/since [facility].  She notes that sleep issues she indicated on distress screen are typical for her in the last couple of years, not a symptom of anxiety related to dx.  Provided pastoral presence, reflective listening, affirmation, and encouragement.  Courtney Shea is aware of ongoing chaplain availability and plans to reach out as needed/desired before or after surgery.  Please also page as needs arise.  Thank you.  [place], [place] Pager:  [phone] Voicemail:  [phone]

## 2015-05-21 ENCOUNTER — Encounter: Payer: Self-pay | Admitting: General Practice

## 2015-05-21 NOTE — Progress Notes (Signed)
Spiritual Care Note  Followed up with [facility] by phone.  She was in good spirits, reports that she's coping well, and has support.  She is aware of ongoing chaplain availability and plans to reach out as needed.  Please also page as needs arise.  Thank you.  [place], [place] Pager [phone] Voicmail [phone]

## 2015-05-23 ENCOUNTER — Encounter (HOSPITAL_BASED_OUTPATIENT_CLINIC_OR_DEPARTMENT_OTHER): Payer: Self-pay | Admitting: *Deleted

## 2015-05-24 ENCOUNTER — Telehealth: Payer: Self-pay | Admitting: *Deleted

## 2015-05-24 NOTE — Telephone Encounter (Signed)
-----   Message from Mauro Kaufmann, RN sent at 05/21/2015 11:32 AM EDT ----- Regarding: [place] Team,  Ms Pulse was seen in clinic on 7/13. She is scheduled for the following.  Surgery- 8/2 F/u with Dr. Isidore Moos - 8/17 F/u with Dr. Jana Hakim - 9/27  Please let me know if you have questions.  Thanks, [employer]

## 2015-05-25 ENCOUNTER — Encounter (HOSPITAL_BASED_OUTPATIENT_CLINIC_OR_DEPARTMENT_OTHER)
Admission: RE | Admit: 2015-05-25 | Discharge: 2015-05-25 | Disposition: A | Discharge status: Home / Self Care | Payer: Medicare Other | Source: Ambulatory Visit | Attending: Surgery | Admitting: Surgery

## 2015-05-25 DIAGNOSIS — C50912 Malignant neoplasm of unspecified site of left female breast: Secondary | ICD-10-CM | POA: Diagnosis not present

## 2015-05-25 DIAGNOSIS — I10 Essential (primary) hypertension: Secondary | ICD-10-CM | POA: Diagnosis not present

## 2015-05-25 DIAGNOSIS — Z01810 Encounter for preprocedural cardiovascular examination: Secondary | ICD-10-CM | POA: Insufficient documentation

## 2015-05-29 ENCOUNTER — Ambulatory Visit
Admission: RE | Admit: 2015-05-29 | Discharge: 2015-05-29 | Disposition: A | Discharge status: Home / Self Care | Payer: Medicare Other | Source: Ambulatory Visit | Attending: Surgery | Admitting: Surgery

## 2015-05-29 ENCOUNTER — Encounter (HOSPITAL_BASED_OUTPATIENT_CLINIC_OR_DEPARTMENT_OTHER): Payer: Self-pay | Admitting: Anesthesiology

## 2015-05-29 ENCOUNTER — Ambulatory Visit (HOSPITAL_BASED_OUTPATIENT_CLINIC_OR_DEPARTMENT_OTHER): Payer: Medicare Other | Admitting: Anesthesiology

## 2015-05-29 ENCOUNTER — Encounter (HOSPITAL_BASED_OUTPATIENT_CLINIC_OR_DEPARTMENT_OTHER)
Admission: RE | Disposition: A | Discharge status: Home / Self Care | Payer: Self-pay | Source: Ambulatory Visit | Attending: Surgery

## 2015-05-29 ENCOUNTER — Ambulatory Visit (HOSPITAL_BASED_OUTPATIENT_CLINIC_OR_DEPARTMENT_OTHER)
Admission: RE | Admit: 2015-05-29 | Discharge: 2015-05-29 | Disposition: A | Discharge status: Home / Self Care | Payer: Medicare Other | Source: Ambulatory Visit | Attending: Surgery | Admitting: Surgery

## 2015-05-29 DIAGNOSIS — C50912 Malignant neoplasm of unspecified site of left female breast: Secondary | ICD-10-CM

## 2015-05-29 DIAGNOSIS — Z853 Personal history of malignant neoplasm of breast: Secondary | ICD-10-CM | POA: Diagnosis not present

## 2015-05-29 DIAGNOSIS — N6011 Diffuse cystic mastopathy of right breast: Secondary | ICD-10-CM | POA: Diagnosis not present

## 2015-05-29 DIAGNOSIS — I10 Essential (primary) hypertension: Secondary | ICD-10-CM | POA: Diagnosis not present

## 2015-05-29 DIAGNOSIS — N63 Unspecified lump in breast: Secondary | ICD-10-CM | POA: Diagnosis not present

## 2015-05-29 DIAGNOSIS — D4861 Neoplasm of uncertain behavior of right breast: Secondary | ICD-10-CM | POA: Diagnosis not present

## 2015-05-29 DIAGNOSIS — N6489 Other specified disorders of breast: Secondary | ICD-10-CM | POA: Diagnosis not present

## 2015-05-29 DIAGNOSIS — N6081 Other benign mammary dysplasias of right breast: Secondary | ICD-10-CM | POA: Diagnosis not present

## 2015-05-29 HISTORY — PX: PARTIAL MASTECTOMY WITH NEEDLE LOCALIZATION: SHX6008

## 2015-05-29 SURGERY — PARTIAL MASTECTOMY WITH NEEDLE LOCALIZATION
Anesthesia: General | Site: Breast | Laterality: Bilateral

## 2015-05-29 MED ORDER — HYDROCODONE-ACETAMINOPHEN 5-325 MG PO TABS: 1.0000 | ORAL_TABLET | Freq: Four times a day (QID) | ORAL | Status: DC | PRN

## 2015-05-29 MED ORDER — FENTANYL CITRATE (PF) 100 MCG/2ML IJ SOLN
50.0000 ug | INTRAMUSCULAR | Status: AC | PRN
  Administered 2015-05-29 (×2): 25 ug via INTRAVENOUS
  Administered 2015-05-29: 50 ug via INTRAVENOUS

## 2015-05-29 MED ORDER — LIDOCAINE HCL (CARDIAC) 20 MG/ML IV SOLN
INTRAVENOUS | Status: DC | PRN
  Administered 2015-05-29: 100 mg via INTRAVENOUS

## 2015-05-29 MED ORDER — GLYCOPYRROLATE 0.2 MG/ML IJ SOLN: 0.2000 mg | Freq: Once | INTRAMUSCULAR | Status: DC | PRN

## 2015-05-29 MED ORDER — PROPOFOL 10 MG/ML IV BOLUS
INTRAVENOUS | Status: DC | PRN
  Administered 2015-05-29: 150 mg via INTRAVENOUS

## 2015-05-29 MED ORDER — ONDANSETRON HCL 4 MG/2ML IJ SOLN
INTRAMUSCULAR | Status: DC | PRN
  Administered 2015-05-29: 4 mg via INTRAVENOUS

## 2015-05-29 MED ORDER — SCOPOLAMINE 1 MG/3DAYS TD PT72: 1.0000 | MEDICATED_PATCH | Freq: Once | TRANSDERMAL | Status: DC | PRN

## 2015-05-29 MED ORDER — HYDROCODONE-ACETAMINOPHEN 5-325 MG PO TABS
ORAL_TABLET | ORAL | Status: AC
  Filled 2015-05-29: qty 1

## 2015-05-29 MED ORDER — MEPERIDINE HCL 25 MG/ML IJ SOLN: 6.2500 mg | INTRAMUSCULAR | Status: DC | PRN

## 2015-05-29 MED ORDER — FENTANYL CITRATE (PF) 100 MCG/2ML IJ SOLN
INTRAMUSCULAR | Status: AC
  Filled 2015-05-29: qty 4

## 2015-05-29 MED ORDER — CHLORHEXIDINE GLUCONATE 4 % EX LIQD: 1.0000 "application " | Freq: Once | CUTANEOUS | Status: DC

## 2015-05-29 MED ORDER — DEXTROSE 5 % IV SOLN
3.0000 g | INTRAVENOUS | Status: AC
  Administered 2015-05-29: 2 g via INTRAVENOUS

## 2015-05-29 MED ORDER — LACTATED RINGERS IV SOLN
INTRAVENOUS | Status: DC
  Administered 2015-05-29 (×2): via INTRAVENOUS

## 2015-05-29 MED ORDER — ONDANSETRON HCL 4 MG/2ML IJ SOLN: 4.0000 mg | Freq: Once | INTRAMUSCULAR | Status: DC | PRN

## 2015-05-29 MED ORDER — HYDROMORPHONE HCL 1 MG/ML IJ SOLN: 0.2500 mg | INTRAMUSCULAR | Status: DC | PRN

## 2015-05-29 MED ORDER — CEFAZOLIN SODIUM-DEXTROSE 2-3 GM-% IV SOLR
INTRAVENOUS | Status: AC
  Filled 2015-05-29: qty 50

## 2015-05-29 MED ORDER — ACETAMINOPHEN 325 MG PO TABS
650.0000 mg | ORAL_TABLET | Freq: Once | ORAL | Status: AC
  Administered 2015-05-29: 650 mg via ORAL

## 2015-05-29 MED ORDER — MIDAZOLAM HCL 2 MG/2ML IJ SOLN: 1.0000 mg | INTRAMUSCULAR | Status: DC | PRN

## 2015-05-29 MED ORDER — ACETAMINOPHEN 325 MG PO TABS
ORAL_TABLET | ORAL | Status: AC
  Filled 2015-05-29: qty 2

## 2015-05-29 MED ORDER — DEXAMETHASONE SODIUM PHOSPHATE 4 MG/ML IJ SOLN
INTRAMUSCULAR | Status: DC | PRN
  Administered 2015-05-29: 10 mg via INTRAVENOUS

## 2015-05-29 MED ORDER — ONDANSETRON HCL 4 MG PO TABS: 4.0000 mg | ORAL_TABLET | Freq: Three times a day (TID) | ORAL | Status: DC | PRN

## 2015-05-29 MED ORDER — BUPIVACAINE-EPINEPHRINE 0.25% -1:200000 IJ SOLN
INTRAMUSCULAR | Status: DC | PRN
  Administered 2015-05-29: 10 mL
  Administered 2015-05-29: 20 mL

## 2015-05-29 MED ORDER — BUPIVACAINE-EPINEPHRINE (PF) 0.25% -1:200000 IJ SOLN
INTRAMUSCULAR | Status: AC
  Filled 2015-05-29: qty 60

## 2015-05-29 SURGICAL SUPPLY — 48 items
APPLIER CLIP 9.375 MED OPEN (MISCELLANEOUS) ×3
BINDER BREAST LRG (GAUZE/BANDAGES/DRESSINGS) ×3 IMPLANT
BINDER BREAST MEDIUM (GAUZE/BANDAGES/DRESSINGS) IMPLANT
BINDER BREAST XLRG (GAUZE/BANDAGES/DRESSINGS) IMPLANT
BINDER BREAST XXLRG (GAUZE/BANDAGES/DRESSINGS) IMPLANT
BLADE SURG 15 STRL LF DISP TIS (BLADE) ×1 IMPLANT
BLADE SURG 15 STRL SS (BLADE) ×2
CANISTER SUCT 1200ML W/VALVE (MISCELLANEOUS) ×3 IMPLANT
CHLORAPREP W/TINT 26ML (MISCELLANEOUS) ×3 IMPLANT
CLIP APPLIE 9.375 MED OPEN (MISCELLANEOUS) ×1 IMPLANT
CLIP TI WIDE RED SMALL 6 (CLIP) IMPLANT
COVER BACK TABLE 60X90IN (DRAPES) ×3 IMPLANT
COVER MAYO STAND STRL (DRAPES) ×3 IMPLANT
DECANTER SPIKE VIAL GLASS SM (MISCELLANEOUS) IMPLANT
DEVICE DUBIN W/COMP PLATE 8390 (MISCELLANEOUS) ×6 IMPLANT
DRAPE LAPAROSCOPIC ABDOMINAL (DRAPES) ×3 IMPLANT
DRAPE LAPAROTOMY 100X72 PEDS (DRAPES) IMPLANT
DRAPE UTILITY XL STRL (DRAPES) ×3 IMPLANT
ELECT COATED BLADE 2.86 ST (ELECTRODE) ×3 IMPLANT
ELECT REM PT RETURN 9FT ADLT (ELECTROSURGICAL) ×3
ELECTRODE REM PT RTRN 9FT ADLT (ELECTROSURGICAL) ×1 IMPLANT
GLOVE BIOGEL PI IND STRL 7.0 (GLOVE) ×1 IMPLANT
GLOVE BIOGEL PI IND STRL 8 (GLOVE) ×1 IMPLANT
GLOVE BIOGEL PI INDICATOR 7.0 (GLOVE) ×2
GLOVE BIOGEL PI INDICATOR 8 (GLOVE) ×2
GLOVE ECLIPSE 6.5 STRL STRAW (GLOVE) ×3 IMPLANT
GLOVE ECLIPSE 8.0 STRL XLNG CF (GLOVE) ×3 IMPLANT
GLOVE EXAM NITRILE PF LG BLUE (GLOVE) ×3 IMPLANT
GOWN STRL REUS W/ TWL LRG LVL3 (GOWN DISPOSABLE) ×2 IMPLANT
GOWN STRL REUS W/TWL LRG LVL3 (GOWN DISPOSABLE) ×4
KIT MARKER MARGIN INK (KITS) ×3 IMPLANT
LIQUID BAND (GAUZE/BANDAGES/DRESSINGS) ×3 IMPLANT
NEEDLE HYPO 25X1 1.5 SAFETY (NEEDLE) ×3 IMPLANT
NS IRRIG 1000ML POUR BTL (IV SOLUTION) ×3 IMPLANT
PACK BASIN DAY SURGERY FS (CUSTOM PROCEDURE TRAY) ×3 IMPLANT
PENCIL BUTTON HOLSTER BLD 10FT (ELECTRODE) ×3 IMPLANT
SLEEVE SCD COMPRESS KNEE MED (MISCELLANEOUS) ×3 IMPLANT
SPONGE LAP 4X18 X RAY DECT (DISPOSABLE) ×6 IMPLANT
STAPLER VISISTAT 35W (STAPLE) IMPLANT
SUT MON AB 4-0 PC3 18 (SUTURE) ×6 IMPLANT
SUT SILK 2 0 SH (SUTURE) IMPLANT
SUT VICRYL 3-0 CR8 SH (SUTURE) ×6 IMPLANT
SYR CONTROL 10ML LL (SYRINGE) ×3 IMPLANT
TOWEL OR 17X24 6PK STRL BLUE (TOWEL DISPOSABLE) ×3 IMPLANT
TOWEL OR NON WOVEN STRL DISP B (DISPOSABLE) IMPLANT
TUBE CONNECTING 20'X1/4 (TUBING) ×1
TUBE CONNECTING 20X1/4 (TUBING) ×2 IMPLANT
YANKAUER SUCT BULB TIP NO VENT (SUCTIONS) ×3 IMPLANT

## 2015-05-29 NOTE — Interval H&P Note (Signed)
History and Physical Interval Note:  05/29/2015 12:46 PM  Courtney Shea  has presented today for surgery, with the diagnosis of Left Breast Cancer  The various methods of treatment have been discussed with the patient and family. After consideration of risks, benefits and other options for treatment, the patient has consented to  Procedure(s): LEFT BREAST PARTIAL MASTECTOMY WITH NEEDLE LOCALIZATION TIMES 2 AND RIGHT BREAST PARTIAL MASTECTOMY WITH NEEDLE LOCALIZATION (Bilateral) as a surgical intervention .  The patient's history has been reviewed, patient examined, no change in status, stable for surgery.  I have reviewed the patient's chart and labs.  Questions were answered to the patient's satisfaction.     Coulter Oldaker A.

## 2015-05-29 NOTE — H&P (View-Only) (Signed)
Courtney Shea 05/09/2015 8:47 AM Location: [facility] Patient #: [id number] DOB: 09-25-1930 Undefined / Language: Courtney Shea / Race: Undefined Female History of Present Illness Courtney Moores A. Lynsee Wands MD; 05/09/2015 2:33 PM) Patient words: PT [place] LEFT BREAST CANCER TIMES TWO AND AREA OF ALH RIGHT BREAST DIAGNOSED BY MAMMOGRAM, U/S , CORE BIOPSY AND MRI. There ar two area in the left breast 4 cm apart central to medial in location measuring 3.2 cm and 1 . 3 cm both appear to be lobular breast carcinoma and area min the right breast is ALH. The tumor is ER/PR positive her 2 neu negative. No history of breast pain, mass or discharge         ADDITIONAL INFORMATION: 1. PROGNOSTIC INDICATORS - ACIS Results: IMMUNOHISTOCHEMICAL AND MORPHOMETRIC ANALYSIS BY THE AUTOMATED CELLULAR IMAGING SYSTEM (ACIS) Estrogen Receptor: 95%, POSITIVE, STRONG STAINING INTENSITY (PERFORMED MANUALLY) Progesterone Receptor: 1%, POSITIVE, MODERATE STAINING INTENSITY (PERFORMED MANUALLY) Proliferation Marker Ki67: 5% (PERFORMED MANUALLY) REFERENCE RANGE ESTROGEN RECEPTOR NEGATIVE <1% POSITIVE =>1% PROGESTERONE RECEPTOR NEGATIVE <1% POSITIVE =>1% All controls stained appropriately Courtney Shea Pathologist, Electronic Signature ( Signed 05/09/2015) 1. FLUORESCENCE IN-SITU HYBRIDIZATION Results: HER2 - NEGATIVE RATIO OF HER2/CEP17 SIGNALS 1.52 AVERAGE HER2 COPY NUMBER PER CELL 2.05 Reference Range: NEGATIVE HER2/CEP17 Ratio <2.0 and average HER2 copy number <4.0 1 of 3 FINAL for Courtney Shea, Courtney Shea ([id number]) ADDITIONAL INFORMATION:(continued) EQUIVOCAL HER2/CEP17 Ratio <2.0 and average HER2 copy number >=4.0 and <6.0 POSITIVE HER2/CEP17 Ratio >=2.0 or <2.0 and average HER2 copy number >=6.0 Courtney Shea Pathologist, Electronic Signature ( Signed 05/09/2015) FINAL DIAGNOSIS Diagnosis 1. Breast, left, needle core biopsy, upper inner quadrant - INVASIVE AND  IN SITU MAMMARY CARCINOMA. - SEE COMMENT. 2. Breast, left, needle core biopsy, upper inner quadrant, #2 1 cm posterior to #1 - INVASIVE AND IN SITU MAMMARY CARCINOMA. - SEE COMMENT. Microscopic Comment 1. The carcinoma appears grade 1-2 and is likely a lobular phenotype. A breast prognostic profile will be performed and the results reported separately. The results were called to [facility] on 05/04/15. 2. The carcinoma in part 2 is morphologically identical to that in part 1. A breast prognostic profile will not be performed on part 2 unless requested. (JBK:gt, 05/04/15) Courtney Cutter MD Pathologist, Electronic Signature (Case signed 05/04/2015) Specimen Gross and Clinical Information Specimen Comment 1. In formalin 10:20, extracted 2 min; two areas of arch distortion: patient with known CSL on right; two adjacent upper inner quadrant lesions which look the same, #1 1 cm anterior to #2 2. In formalin 10:24, extracted 2 min Specimen(s) Obtained: 1. Breast, left, needle core biopsy, upper inner quadrant 2. Breast, left, needle core biopsy, upper inner quadrant, #2 1 cm posterior to #1 Specimen Clinical Information 1. Prob CSLs vs. carcinoma Gross 1. Received in formalin are six white-yellow fibrofatty core tissue averaging 3.5 cm in length x 0.2 cm in diameter. The tissues are entirely submitted as follows: A= three cores B= three cores. 2 blocks total. Time in formalin 10:20 a.m., cold ischemic time two minutes. 2 of 3 FINAL for Courtney Shea, Courtney Shea ([id number]) Gross(continued) 2. Received in formalin are seven white rubbery tubular core tissue ranging from 1.5 to 3.0 cm in length x 0.3 cm in diameter. The tissues are entirely submitted in two blocks as follows: A= four cores B= three cores. Time in formalin 10:24 a.m., cold ischemic time two minutes. (KF:gt, 05/04/15) Stain(s) used in Diagnosis: The following stain(s) were used in diagnosing the  case: Her2 FISH,  ER-ACIS, PR-ACIS, KI-67-ACIS. The control(s) stained appropriately. Disclaimer HER2 IQFISH pharmDX (code [id number]) is a direct fluorescence in-situ hybridization assay designed to quantitatively determine HER2 gene amplification in formalin-fixed, paraffin-embedded tissue specimens. It is performed at [facility] and is reported using ASCO/CAP scoring criteria published in 2013. PR progesterone receptor (PgR 636), immunohistochemical stains are performed on formalin fixed, paraffin embedded tissue using a 3,3"-diaminobenzidine (DAB) chromogen and DAKO Autostainer System. The staining intensity of the nucleus is scored morphometrically using the Automated Cellular Imaging System (ACIS) and is reported as the percentage of tumor cell nuclei demonstrating specific nuclear staining. Estrogen receptor (SP1), immunohistochemical stains are performed on formalin fixed, paraffin embedded tissue using a 3,3"-diaminobenzidine (DAB) chromogen and DAKO Autostainer System. The staining intensity of the nucleus is scored morphometrically using the Automated Cellular Imaging System (ACIS) and is reported as the percentage of tumor cell nuclei demonstrating specific nuclear staining. Ki-67 (Mib-1), immunohistochemical stains are performed on formalin fixed, paraffin embedded tissue using a 3,3"-diaminobenzidine (DAB) chromogen and Page. The staining intensity of the nucleus is scored morphometrically using the Automated Cellular Imaging System (ACIS) and is reported as the percentage of tumor cell nuclei demonstrating specific nuclear staining. Report signed out from the following location(s) Technical Component was performed at [facility]. [address],[place] [id number]., Technical component and interpretation was performed at [address], [place], [place] [address]. CLIA #: [id number], 3  of          CLINICAL DATA: 81 year old female with invasive mammary carcinoma and mammary carcinoma newly diagnosed in 2 separate areas of the upper inner LEFT breast. Also newly diagnosed complex sclerosing lesion and atypical lobular hyperplasia within the lower outer RIGHT breast.  LABS: BUN and creatinine were obtained on site at [facility] at  [address]  Results: BUN 22 mg/dL, Creatinine 0.8 mg/dL.  EXAM: BILATERAL BREAST MRI WITH AND WITHOUT CONTRAST  TECHNIQUE: Multiplanar, multisequence MR images of both breasts were obtained prior to and following the intravenous administration of 15 ml of MultiHance.  THREE-DIMENSIONAL MR IMAGE RENDERING ON INDEPENDENT WORKSTATION:  Three-dimensional MR images were rendered by post-processing of the original MR data on an independent workstation. The three-dimensional MR images were interpreted, and findings are reported in the following complete MRI report for this study. Three dimensional images were evaluated at the independent DynaCad workstation  COMPARISON: Recent mammograms.  FINDINGS: Breast composition: d. Extreme fibroglandular tissue.  Background parenchymal enhancement: Mild  Right breast: Biopsy clip artifact within the retroareolar right breast is identified. Post biopsy changes within the retroareolar and anterior slightly lower outer right breast are identified. No other abnormal areas of enhancement are identified.  Left breast: A 2.5 x 3.2 x 3 cm area of ill-defined enhancement within the upper inner left breast, middle third, is noted with biopsy clip artifact in the center of this area, compatible with post biopsy changes/ biopsy-proven malignancy.  A 1.2 x 1.3 x 1 cm area of ill-defined enhancement with biopsy clip artifact within the upper inner left breast at the junction of the anterior and middle thirds is noted compatible with post biopsy changes/ biopsy-proven  malignancy.  No other abnormal areas of enhancement are identified.  Lymph nodes: A level 1 left axillary lymph node has mild focal cortical thickening medially. No other abnormal appearing lymph nodes are identified.  Ancillary findings: None.  IMPRESSION: Two areas of ill-defined enhancement within the upper inner left breast compatible  with post biopsy changes/ biopsy-proven malignancy. The posterior area measures 2.5 x 3.2 x 3 cm and anterior area measures 1.2 x 1.3 x 1 cm. The biopsy clips are separated by a distance of approximately 4 cm craniocaudally and 3 cm AP.  One level 1 left axillary lymph node with mild focal cortical thickening medially -indeterminate.  Post biopsy changes within the anterior right breast in the area of biopsy-proven complex sclerosing lesion/ALH.  RECOMMENDATION: Treatment plan.  BI-RADS CATEGORY 6: Known biopsy-proven malignancy.   Electronically Signed By: Courtney Shea M.D.       Diagnosis Breast, right, needle core biopsy, LOQ - LOBULAR NEOPLASIA (ATYPICAL LOBULAR HYPERPLASIA). - COMPLEX SCLEROSING LESION. - FIBROCYSTIC CHANGES. - SEE COMMENT. Microscopic Comment.  The patient is a 81 year old female   Other Problems Courtney Shea, [place]; 05/09/2015 8:47 AM) Bladder Problems Breast Cancer High blood pressure Other disease, cancer, significant illness  Past Surgical History Courtney Shea, Shea; 05/09/2015 8:47 AM) Breast Biopsy Bilateral. Hysterectomy (due to cancer) - Partial  Diagnostic Studies History Courtney Shea, Shea; 05/09/2015 8:47 AM) Colonoscopy 5-10 years ago Mammogram within last year Pap Smear 1-5 years ago  Social History Courtney Shea, Shea; 05/09/2015 8:47 AM) Caffeine use Carbonated beverages, Coffee. No alcohol use No drug use Tobacco use Never smoker.  Family History Courtney Shea, [place]; 05/09/2015 8:47 AM) Cerebrovascular Accident Father, Mother.  Pregnancy / Birth History  Courtney Shea, [place]; 05/09/2015 8:47 AM) Age at menarche 75 years. Age of menopause <47 Gravida 2 Maternal age 41-25 Para 2 Regular periods     Review of Systems Courtney Shea; 05/09/2015 8:47 AM) General Not Present- Appetite Loss, Chills, Fatigue, Fever, Night Sweats, Weight Gain and Weight Loss. Skin Not Present- Change in Wart/Mole, Dryness, Hives, Jaundice, New Lesions, Non-Healing Wounds, Rash and Ulcer. HEENT Not Present- Earache, Hearing Loss, Hoarseness, Nose Bleed, Oral Ulcers, Ringing in the Ears, Seasonal Allergies, Sinus Pain, Sore Throat, Visual Disturbances, Wears glasses/contact lenses and Yellow Eyes. Respiratory Not Present- Bloody sputum, Chronic Cough, Difficulty Breathing, Snoring and Wheezing. Breast Not Present- Breast Mass, Breast Pain, Nipple Discharge and Skin Changes. Cardiovascular Not Present- Chest Pain, Difficulty Breathing Lying Down, Leg Cramps, Palpitations, Rapid Heart Rate, Shortness of Breath and Swelling of Extremities. Gastrointestinal Not Present- Abdominal Pain, Bloating, Bloody Stool, Change in Bowel Habits, Chronic diarrhea, Constipation, Difficulty Swallowing, Excessive gas, Gets full quickly at meals, Hemorrhoids, Indigestion, Nausea, Rectal Pain and Vomiting. Female Genitourinary Not Present- Frequency, Nocturia, Painful Urination, Pelvic Pain and Urgency. Musculoskeletal Not Present- Back Pain, Joint Pain, Joint Stiffness, Muscle Pain, Muscle Weakness and Swelling of Extremities. Hematology Not Present- Easy Bruising, Excessive bleeding, Gland problems, HIV and Persistent Infections.   Physical Exam (Courtney Shea A. Courtney Blankenhorn MD; 05/09/2015 2:28 PM)  General Mental Status-Alert. General Appearance-Consistent with stated age. Hydration-Well hydrated. Voice-Normal.  Head and Neck Head-normocephalic, atraumatic with no lesions or palpable masses. Trachea-midline. Thyroid Gland Characteristics - normal size and  consistency.  Chest and Lung Exam Chest and lung exam reveals -quiet, even and easy respiratory effort with no use of accessory muscles and on auscultation, normal breath sounds, no adventitious sounds and normal vocal resonance. Inspection Chest Wall - Normal. Back - normal.  Breast Note: right breast no nass or discharge bx site noted  left breast biopsy sites noted minimal bruise no mass or nipple discharge   Cardiovascular Cardiovascular examination reveals -normal heart sounds, regular rate and rhythm with no murmurs and normal pedal pulses bilaterally.  Musculoskeletal Normal Exam - Left-Upper Extremity Strength Normal  and Lower Extremity Strength Normal. Normal Exam - Right-Upper Extremity Strength Normal and Lower Extremity Strength Normal.  Lymphatic Head & Neck  General Head & Neck Lymphatics: Bilateral - Description - Normal. Axillary  General Axillary Region: Bilateral - Description - Normal. Tenderness - Non Tender.    Assessment & Plan (Velvia Mehrer A. Jonnie Kubly MD; 05/09/2015 2:32 PM)  BREAST CANCER, STAGE 2, LEFT (174.9  C50.912) Impression: appears to be lobular in nature pt desires breast conservation and is ok with possible cosmetic deformity. The LN on MRI appears not to be pathologic and will not impact treatment in this cace. The area in the right breast needs excision. Recommended bilateral wire localized partial mastectomy with 2 wires in the left breast and one in the right. Risk of partial include bleeding, infection, seroma, more surgery, use of seed/wire, wound care, cosmetic deformity and the need for other treatments, death , blood clots, death. Pt agrees to proceed. No sentinel node for this case since risk outweigh benefits.  Current Plans Pt Education - Breast Cancer: discussed with patient and provided information. Pt Education - [facility] Breast Biopsy [facility]

## 2015-05-29 NOTE — Anesthesia Postprocedure Evaluation (Signed)
Anesthesia Post Note  Patient: Courtney Shea  Procedure(s) Performed: Procedure(s) (LRB): LEFT BREAST PARTIAL MASTECTOMY WITH NEEDLE LOCALIZATION TIMES 2 AND RIGHT BREAST PARTIAL MASTECTOMY WITH NEEDLE LOCALIZATION (Bilateral)  Anesthesia type: general  Patient location: PACU  Post pain: Pain level controlled  Post assessment: Patient's Cardiovascular Status Stable  Last Vitals:  Filed Vitals:   05/29/15 1500  BP: 146/86  Pulse: 70  Temp:   Resp: 13    Post vital signs: Reviewed and stable  Level of consciousness: sedated  Complications: No apparent anesthesia complications

## 2015-05-29 NOTE — Transfer of Care (Signed)
Immediate Anesthesia Transfer of Care Note  Patient: Courtney Shea  Procedure(s) Performed: Procedure(s): LEFT BREAST PARTIAL MASTECTOMY WITH NEEDLE LOCALIZATION TIMES 2 AND RIGHT BREAST PARTIAL MASTECTOMY WITH NEEDLE LOCALIZATION (Bilateral)  Patient Location: PACU  Anesthesia Type:General  Level of Consciousness: awake, sedated and patient cooperative  Airway & Oxygen Therapy: Patient Spontanous Breathing and Patient connected to face mask oxygen  Post-op Assessment: Report given to RN and Post -op Vital signs reviewed and stable  Post vital signs: Reviewed and stable  Last Vitals:  Filed Vitals:   05/29/15 1157  BP: 159/81  Pulse: 67  Temp: 36.8 C  Resp: 20    Complications: No apparent anesthesia complications

## 2015-05-29 NOTE — Discharge Instructions (Signed)
[facility] °Office Phone Number [phone] ° °BREAST BIOPSY/ PARTIAL MASTECTOMY: POST OP INSTRUCTIONS ° °Always review your discharge instruction sheet given to you by the facility where your surgery was performed. ° °IF YOU HAVE DISABILITY OR FAMILY LEAVE FORMS, YOU MUST BRING THEM TO THE OFFICE FOR PROCESSING.  DO NOT GIVE THEM TO YOUR DOCTOR. ° °1. A prescription for pain medication may be given to you upon discharge.  Take your pain medication as prescribed, if needed.  If narcotic pain medicine is not needed, then you may take acetaminophen (Tylenol) or ibuprofen (Advil) as needed. °2. Take your usually prescribed medications unless otherwise directed °3. If you need a refill on your pain medication, please contact your pharmacy.  They will contact our office to request authorization.  Prescriptions will not be filled after 5pm or on week-ends. °4. You should eat very light the first 24 hours after surgery, such as soup, crackers, pudding, etc.  Resume your normal diet the day after surgery. °5. Most patients will experience some swelling and bruising in the breast.  Ice packs and a good support bra will help.  Swelling and bruising can take several days to resolve.  °6. It is common to experience some constipation if taking pain medication after surgery.  Increasing fluid intake and taking a stool softener will usually help or prevent this problem from occurring.  A mild laxative (Milk of Magnesia or Miralax) should be taken according to package directions if there are no bowel movements after 48 hours. °7. Unless discharge instructions indicate otherwise, you may remove your bandages 24-48 hours after surgery, and you may shower at that time.  You may have steri-strips (small skin tapes) in place directly over the incision.  These strips should be left on the skin for 7-10 days.  If your surgeon used skin glue on the incision, you may shower in 24 hours.  The glue will flake off over the  next 2-3 weeks.  Any sutures or staples will be removed at the office during your follow-up visit. °8. ACTIVITIES:  You may resume regular daily activities (gradually increasing) beginning the next day.  Wearing a good support bra or sports bra minimizes pain and swelling.  You may have sexual intercourse when it is comfortable. °a. You may drive when you no longer are taking prescription pain medication, you can comfortably wear a seatbelt, and you can safely maneuver your car and apply brakes. °b. RETURN TO WORK:  ______________________________________________________________________________________ °9. You should see your doctor in the office for a follow-up appointment approximately two weeks after your surgery.  Your doctor’s nurse will typically make your follow-up appointment when she calls you with your pathology report.  Expect your pathology report 2-3 business days after your surgery.  You may call to check if you do not hear from us after three days. °10. OTHER INSTRUCTIONS: _______________________________________________________________________________________________ _____________________________________________________________________________________________________________________________________ °_____________________________________________________________________________________________________________________________________ °_____________________________________________________________________________________________________________________________________ ° °WHEN TO CALL YOUR DOCTOR: °1. Fever over 101.0 °2. Nausea and/or vomiting. °3. Extreme swelling or bruising. °4. Continued bleeding from incision. °5. Increased pain, redness, or drainage from the incision. ° °The clinic staff is available to answer your questions during regular business hours.  Please don’t hesitate to call and ask to speak to one of the nurses for clinical concerns.  If you have a medical emergency, go to the nearest  emergency room or call 911.  A surgeon from [facility] is always on call at the hospital. ° °For further questions, please visit [email or web address]  ° ° ° °  Post Anesthesia Home Care Instructions ° °Activity: °Get plenty of rest for the remainder of the day. A responsible adult should stay with you for 24 hours following the procedure.  °For the next 24 hours, DO NOT: °-Drive a car °-Operate machinery °-Drink alcoholic beverages °-Take any medication unless instructed by your physician °-Make any legal decisions or sign important papers. ° °Meals: °Start with liquid foods such as gelatin or soup. Progress to regular foods as tolerated. Avoid greasy, spicy, heavy foods. If nausea and/or vomiting occur, drink only clear liquids until the nausea and/or vomiting subsides. Call your physician if vomiting continues. ° °Special Instructions/Symptoms: °Your throat may feel dry or sore from the anesthesia or the breathing tube placed in your throat during surgery. If this causes discomfort, gargle with warm salt water. The discomfort should disappear within 24 hours. ° °If you had a scopolamine patch placed behind your ear for the management of post- operative nausea and/or vomiting: ° °1. The medication in the patch is effective for 72 hours, after which it should be removed.  Wrap patch in a tissue and discard in the trash. Wash hands thoroughly with soap and water. °2. You may remove the patch earlier than 72 hours if you experience unpleasant side effects which may include dry mouth, dizziness or visual disturbances. °3. Avoid touching the patch. Wash your hands with soap and water after contact with the patch. °  ° °

## 2015-05-29 NOTE — Anesthesia Procedure Notes (Signed)
Procedure Name: LMA Insertion Date/Time: 05/29/2015 1:20 PM Performed by: Lyndee Leo Pre-anesthesia Checklist: Patient identified, Emergency Drugs available, Suction available and Patient being monitored Patient Re-evaluated:Patient Re-evaluated prior to inductionOxygen Delivery Method: Circle System Utilized Preoxygenation: Pre-oxygenation with 100% oxygen Intubation Type: IV induction Ventilation: Mask ventilation without difficulty LMA: LMA inserted LMA Size: 4.0 Number of attempts: 1 Airway Equipment and Method: Bite block Placement Confirmation: positive ETCO2 Tube secured with: Tape Dental Injury: Teeth and Oropharynx as per pre-operative assessment

## 2015-05-29 NOTE — Anesthesia Preprocedure Evaluation (Signed)
Anesthesia Evaluation  Patient identified by MRN, date of birth, ID band Patient awake    Reviewed: Allergy & Precautions, NPO status , Patient's Chart, lab work & pertinent test results  Airway Mallampati: I  TM Distance: >3 FB Neck ROM: Full    Dental   Pulmonary    Pulmonary exam normal       Cardiovascular hypertension, Pt. on medications Normal cardiovascular exam    Neuro/Psych    GI/Hepatic GERD-  Medicated and Controlled,  Endo/Other    Renal/GU      Musculoskeletal   Abdominal   Peds  Hematology   Anesthesia Other Findings   Reproductive/Obstetrics                             Anesthesia Physical Anesthesia Plan  ASA: III  Anesthesia Plan: General   Post-op Pain Management:    Induction: Intravenous  Airway Management Planned: LMA  Additional Equipment:   Intra-op Plan:   Post-operative Plan: Extubation in OR  Informed Consent: I have reviewed the patients History and Physical, chart, labs and discussed the procedure including the risks, benefits and alternatives for the proposed anesthesia with the patient or authorized representative who has indicated his/her understanding and acceptance.     Plan Discussed with: CRNA and Surgeon  Anesthesia Plan Comments:         Anesthesia Quick Evaluation

## 2015-05-29 NOTE — Op Note (Signed)
Courtney Shea  09-Jan-1930  [id number]  05/29/2015   Preoperative diagnosis: left breast cancer multifocal stage 1   and right ATYPICAL LOBULAR HYPERPLASIA   Postoperative diagnosis: same  Procedure: bilateral  breast partial mastectomy  With wire localization  Surgeon: Erroll Luna , MD, FACS  Anesthesia: General and local    Specimen:  Right breast mass  Left breast mass times 2 and lateral margin  Clinical History and Indications: this patient presents for a guidewire partal mastetcomy for treatment of multifocal left breast cancer and right breast ALH.  The procedure has been discussed with the patient. Alternatives to surgery have been discussed with the patient.  Risks of surgery include bleeding,  Infection,  Seroma formation, death,  and the need for further surgery.   The patient understands and wishes to proceed. The procedure has been discussed with the patient. Alternatives to surgery have been discussed with the patient.  Risks of surgery include bleeding,  Infection,  Seroma formation, death,  and the need for further surgery.   The patient understands and wishes to proceed.  Description of procedure: The patient was seen in the holding area and the plans for the procedure reviewed. Bilateral breast was marked as the operative sides. The wire localizing films were reviewed.  The patient was taken to the operating room and after satisfactory general anesthesia had been obtained both  breast was prepped and draped and the timeout was performed.  The incision on the right breast  was made over the presumed area of the mass. Skin flaps were raised and using cautery the area was completely excised. Bleeders were controlled with either cautery or sutures as needed. The wire and the mass with clip were excised with gross negative margins.  Radiograph shows clip and wire.  The wound was made hemostatic and closed in layers with 3 0 vicryl and 4 0 monocryl.   The incision on the  LEFT MEDIAL  breast  was made over the presumed area of the mass.Two wires were in place.  Skin flaps were raised and using cautery the area was completely excised. Bleeders were controlled with either cautery or sutures as needed. The wire and the mass with clip were excised with gross negative margins.  Radiograph shows clip and wire The wound was made hemostatic and closed in layers with 3 0 vicryl and 4 0 monocryl.   Liquid adhesive applied.     The patient tolerated the procedure well. There were no operative complications. All counts were correct.   EBL: minimal.   Turner Daniels, MD, FACS 05/29/2015 2:29 PM

## 2015-05-30 ENCOUNTER — Encounter (HOSPITAL_BASED_OUTPATIENT_CLINIC_OR_DEPARTMENT_OTHER): Payer: Self-pay | Admitting: Surgery

## 2015-06-11 ENCOUNTER — Ambulatory Visit: Payer: Self-pay | Admitting: Surgery

## 2015-06-12 ENCOUNTER — Other Ambulatory Visit: Payer: Self-pay | Admitting: Oncology

## 2015-06-13 ENCOUNTER — Ambulatory Visit
Admission: RE | Admit: 2015-06-13 | Discharge: 2015-06-13 | Disposition: A | Discharge status: Home / Self Care | Payer: Medicare Other | Source: Ambulatory Visit | Attending: Radiation Oncology | Admitting: Radiation Oncology

## 2015-06-13 ENCOUNTER — Encounter: Payer: Self-pay | Admitting: Radiation Oncology

## 2015-06-13 VITALS — BP 145/85 | HR 76 | Temp 98.0°F | Resp 12 | Wt 169.2 lb

## 2015-06-13 DIAGNOSIS — Z9889 Other specified postprocedural states: Secondary | ICD-10-CM | POA: Diagnosis not present

## 2015-06-13 DIAGNOSIS — C50511 Malignant neoplasm of lower-outer quadrant of right female breast: Secondary | ICD-10-CM | POA: Diagnosis not present

## 2015-06-13 DIAGNOSIS — Z170 Estrogen receptor positive status [ER+]: Secondary | ICD-10-CM | POA: Diagnosis not present

## 2015-06-13 DIAGNOSIS — Z7982 Long term (current) use of aspirin: Secondary | ICD-10-CM | POA: Diagnosis not present

## 2015-06-13 DIAGNOSIS — C50212 Malignant neoplasm of upper-inner quadrant of left female breast: Secondary | ICD-10-CM | POA: Diagnosis not present

## 2015-06-13 DIAGNOSIS — Z510 Encounter for antineoplastic radiation therapy: Secondary | ICD-10-CM | POA: Diagnosis not present

## 2015-06-13 NOTE — Progress Notes (Addendum)
Location of Breast Cancer:Left Breast Lower Outer Quadrant (Stage  II T2NOMO,) Initial Biopsy 05/07/15 Right Breast  Histology per Pathology Report: Diagnosis 05/29/15: 1. Breast, lumpectomy, Right - LOBULAR NEOPLASIA (ATYPICAL LOBULAR HYPERPLASIA).- COMPLEX SCLEROSING LESION WITH BIOPSY SITE CHANGE.- FIBROCYSTIC CHANGE.- NO MALIGNANCY IDENTIFIED. 2. Breast, lumpectomy, Left - INVASIVE LOBULAR CARCINOMA, GRADE I, SEE COMMENT.- LOBULAR CARCINOMA IN SITU AND ATYPICAL LOBULAR HYPERPLASIA.- ANTERIOR MARGIN IS FOCALLY POSITIVE FOR INVASIVE CARCINOMA AND 0.1 CM OR LESS FROMTHE POSTERIOR AND MEDIAL MARGINS.  Receptor Status: ER(+95%), PR (1%), Her2-neu (neg )  Did patient present with symptoms (if so, please note symptoms) or was this found on screening mammography?: mammogram,U/S  Multiple mammographies and U/S dating back to Sept 2013 felet to be fibroadenomatous  2 ares in left breast   Past/Anticipated interventions by surgeon, if any:Dr. Doren Custard  Past/Anticipated interventions by medical oncology, if any: Chemotherapy : Dr. Heath Lark,   Lymphedema issues, if any: no   Pain issues, if any:  1/10 aching at incision site  SAFETY ISSUES:  Prior radiation? NO  Pacemaker/ICD?NO  Possible current pregnancy? NO Is the patient on methotrexate? NO Current Complaints / other details: Married, GXP2, Menarche age 84,1st live birth age 42, hRT 40 years stopped 01/15/2013, Mother/father deceased both strokes    Courtney Eaton, RN 06/13/2015,7:54 AM

## 2015-06-13 NOTE — Progress Notes (Addendum)
Radiation Oncology         [phone] ________________________________  Name: Courtney Shea MRN: [id number]  Date: [birth year]  DOB: [birth year]  Follow-Up Visit Note  Outpatient  CC: GATES,ROBERT NEVILL, MD  Erroll Luna, MD  Diagnosis:   Left Breast Cancer, C50.212   Cancer of the left female breast, pathologic Stage II B pT3Nx, invasive lobular carcinoma, Grade I with LCIS and ALH, ER 95% PR 1%, HER2 negative; clinical Stage IIA T2N0M0.  Narrative:  The patient returns today for follow-up.  Since consultation, she has underwent bilateral lumpectomies. The right lumpectomy showed ALH with complex sclerosing lesion. No malignancy in the right breast indentified. Left breast lumpectomy showed pathology as described above in the diagnosis. She has a focally positive anterior margin for invasive carcinoma and close posterior and medial margins. No lymph nodes were removed. The tumor was 5.4cm. There was no definitive LVSI. She was dicussed at tumor board this morning. Anticipate reexcising of her positive margin. The patient vocalized concern over her pathology report results. This vocalized concern was addressed. She also asked about a new dark brown spot on her right forearm and if "the cancer has caused it" and stated that she has an appointment with a local dermatologist. She projected a healthy mental status and was accompanied by her husband at today's radiation oncology appointment. The patient has no major complaints to note at this time and reports that she is retired.            ALLERGIES:  is allergic to macrodantin; codeine; crestor; doxycycline; hydrochlorothiazide; norvasc; and sulfa antibiotics.  Meds: Current Outpatient Prescriptions  Medication Sig Dispense Refill  . Cranberry 500 MG CAPS Take 500 mg by mouth daily.    Marland Kitchen aspirin EC 81 MG tablet Take 81 mg by mouth every evening.    . Calcium Carbonate-Vitamin D (CALCIUM 600 + D PO) Take 1 tablet by mouth every  evening.     . cloNIDine (CATAPRES) 0.2 MG tablet Take 0.2 mg by mouth at bedtime.  11  . clopidogrel (PLAVIX) 75 MG tablet Take 75 mg by mouth daily.    . furosemide (LASIX) 20 MG tablet Take 20 mg by mouth See admin instructions. Mon. Wed, Friday    . HYDROcodone-acetaminophen (NORCO) 5-325 MG per tablet Take 1 tablet by mouth every 6 (six) hours as needed for moderate pain. 30 tablet 0  . levothyroxine (SYNTHROID, LEVOTHROID) 88 MCG tablet Take 88 mcg by mouth daily.    Marland Kitchen losartan (COZAAR) 100 MG tablet Take 100 mg by mouth daily.    . metoprolol succinate (TOPROL-XL) 50 MG 24 hr tablet Take 50 mg by mouth daily. Take with or immediately following a meal.    . ondansetron (ZOFRAN) 4 MG tablet Take 1 tablet (4 mg total) by mouth every 8 (eight) hours as needed for nausea or vomiting. 20 tablet 0  . pantoprazole (PROTONIX) 40 MG tablet Take 40 mg by mouth every evening.    . sennosides-docusate sodium (SENOKOT-S) 8.6-50 MG tablet Take 1 tablet by mouth daily as needed for constipation.      No current facility-administered medications for this encounter.    Physical Findings: The patient is alert and oriented x79.  weight is 169 lb 3.2 oz (76.749 kg). Her oral temperature is 98 F (36.7 C). Her blood pressure is 145/85 and her pulse is 76. Her respiration is 12 and oxygen saturation is 97%. .     General: Alert and oriented, in no acute  distress Extremities: No UE edema. Skin: dark brown purplish skin lesion, larger than eraser head, with irreg borders (macular) over left forearm Neurologic: No obvious focalities. Speech is fluent.  Psychiatric: Judgment and insight are intact. Affect is appropriate.  Lab Findings: Lab Results  Component Value Date   WBC 7.7 05/09/2015   HGB 13.2 05/09/2015   HCT 39.3 05/09/2015   MCV 89.9 05/09/2015   PLT 251 05/09/2015      Radiographic Findings: Mm Breast Surgical Specimen  05/29/2015   CLINICAL DATA:  Patient with biopsy-proven left breast  cancer x2 and right breast ALH status post needle localizations for surgical excisions  EXAM: SPECIMEN RADIOGRAPH OF THE BILATERAL BREAST  COMPARISON:  Previous exam(s).  FINDINGS: Status post excision of the bilateral breast. The 2 distal wires with adjacent biopsy site markers are centered within the presented left breast specimen. The single distal wire with adjacent biopsy site marker is also centered within the presented right breast specimen. Positions of the biopsy site markers were discussed with the OR staff during the procedures.  IMPRESSION: Specimen radiograph of the bilateral breasts.   Electronically Signed   By: Franki Cabot M.D.   On: 05/29/2015 16:39   Mm Breast Surgical Specimen  05/29/2015   CLINICAL DATA:  Patient with biopsy-proven left breast cancer x2 and right breast ALH status post needle localizations for surgical excisions  EXAM: SPECIMEN RADIOGRAPH OF THE BILATERAL BREAST  COMPARISON:  Previous exam(s).  FINDINGS: Status post excision of the bilateral breast. The 2 distal wires with adjacent biopsy site markers are centered within the presented left breast specimen. The single distal wire with adjacent biopsy site marker is also centered within the presented right breast specimen. Positions of the biopsy site markers were discussed with the OR staff during the procedures.  IMPRESSION: Specimen radiograph of the bilateral breasts.   Electronically Signed   By: Franki Cabot M.D.   On: 05/29/2015 16:39   Mm Lt Plc Breast Loc Dev   1st Lesion  Inc Mammo Guide  05/29/2015   CLINICAL DATA:  Patient with biopsy-proven left breast cancer x2 and right breast ALH.  EXAM: NEEDLE LOCALIZATION OF THE BILATERAL BREASTS WITH MAMMO GUIDANCE  COMPARISON:  Previous exams including diagnostic mammograms dated 05/03/2015 and 04/27/2015.  FINDINGS: Patient presents for needle localization prior to surgical excisions. I met with the patient and we discussed the procedure of needle localization including  benefits and alternatives. We discussed the high likelihood of a successful procedure. We discussed the risks of the procedure, including infection, bleeding, tissue injury, and further surgery. Informed, written consent was given. The usual time-out protocol was performed immediately prior to the procedure.  Using mammographic guidance, sterile technique, 2% lidocaine and a modified Kopans needle, the X shaped biopsy site marker within the right breast was localized using a lateral approach.  Subsequently, under mammographic guidance using sterile technique, with 2% lidocaine for local anesthesia, and a modified Kopan's needle, the 2 biopsy site markers within the left breast were localized to with a modified Kopan's needle using a medial approach. The images were marked for Dr. Brantley Stage.  IMPRESSION: Needle localization of the bilateral breasts, as detailed above. No apparent complications.   Electronically Signed   By: Franki Cabot M.D.   On: 05/29/2015 16:34   Mm Lt Plc Breast Loc Dev   Ea Add Lesion  Inc Mammo Guide  05/29/2015   CLINICAL DATA:  Patient with biopsy-proven left breast cancer x2 and right breast ALH.  EXAM: NEEDLE LOCALIZATION OF THE BILATERAL BREASTS WITH MAMMO GUIDANCE  COMPARISON:  Previous exams including diagnostic mammograms dated 05/03/2015 and 04/27/2015.  FINDINGS: Patient presents for needle localization prior to surgical excisions. I met with the patient and we discussed the procedure of needle localization including benefits and alternatives. We discussed the high likelihood of a successful procedure. We discussed the risks of the procedure, including infection, bleeding, tissue injury, and further surgery. Informed, written consent was given. The usual time-out protocol was performed immediately prior to the procedure.  Using mammographic guidance, sterile technique, 2% lidocaine and a modified Kopans needle, the X shaped biopsy site marker within the right breast was localized  using a lateral approach.  Subsequently, under mammographic guidance using sterile technique, with 2% lidocaine for local anesthesia, and a modified Kopan's needle, the 2 biopsy site markers within the left breast were localized to with a modified Kopan's needle using a medial approach. The images were marked for Dr. Brantley Stage.  IMPRESSION: Needle localization of the bilateral breasts, as detailed above. No apparent complications.   Electronically Signed   By: Franki Cabot M.D.   On: 05/29/2015 16:34   Mm Rt Plc Breast Loc Dev   1st Lesion  Inc Mammo Guide  05/29/2015   CLINICAL DATA:  Patient with biopsy-proven left breast cancer x2 and right breast ALH.  EXAM: NEEDLE LOCALIZATION OF THE BILATERAL BREASTS WITH MAMMO GUIDANCE  COMPARISON:  Previous exams including diagnostic mammograms dated 05/03/2015 and 04/27/2015.  FINDINGS: Patient presents for needle localization prior to surgical excisions. I met with the patient and we discussed the procedure of needle localization including benefits and alternatives. We discussed the high likelihood of a successful procedure. We discussed the risks of the procedure, including infection, bleeding, tissue injury, and further surgery. Informed, written consent was given. The usual time-out protocol was performed immediately prior to the procedure.  Using mammographic guidance, sterile technique, 2% lidocaine and a modified Kopans needle, the X shaped biopsy site marker within the right breast was localized using a lateral approach.  Subsequently, under mammographic guidance using sterile technique, with 2% lidocaine for local anesthesia, and a modified Kopan's needle, the 2 biopsy site markers within the left breast were localized to with a modified Kopan's needle using a medial approach. The images were marked for Dr. Brantley Stage.  IMPRESSION: Needle localization of the bilateral breasts, as detailed above. No apparent complications.   Electronically Signed   By: Franki Cabot  M.D.   On: 05/29/2015 16:34    Impression/Plan:  This is a lovely 81 year old woman presenting to clinic with cancer of the left female breast, pStage II B T3NX invasive lobular carcinoma, Grade I with LCIS and ALH, ER 95% PR 1%, HER2 negative, clinical Stage II A T2N0M0, with a positive margin. The patient was informed and agrees with plans for excision of positive margin. We discussed adjuvant radiotherapy today. I recommend whole breast radiotherapy with high tangents in order to reduce her risk of locoregional occurrence by 2/3. The risks, benefits and side effects of this treatment were discussed in detail. If common symptoms are to occur, healthy management methods were recommended on how to address these symptoms. The traditional process that a patient will experience was reviewed.  She understands that radiotherapy is associated with skin irritation and fatigue in the acute setting. Late effects can include cosmetic changes and rare injury to internal organs. She is enthusiastic about proceeding with treatment. All consent documentation has been fully reviewed, signed  and placed in her chart. A CT scan and planning session is to be scheduled after surgery, with radiation treatments to follow. The patient was instructed to seek an appointment with a dermatologist in regards to the dark spot on her right forearm, which currently concerns her.  All other vocalized questions and concerns were fully answered. If the patient or her husband develop any further questions or concerns in regards to her treatment, she has been encouraged to contactme. I will await her referral back to me when he reexcision is complete and she is ready for treatment planning.  I spent 20 minutes face to face with the patient and more than 50% of that time was spent in counseling and/or coordination of care.   This document serves as a record of services personally performed by Eppie Gibson, MD. It was created on her behalf by  Lenn Cal, a trained medical scribe. The creation of this record is based on the scribe's personal observations and the provider's statements to them. This document has been checked and approved by the attending provider.  __________________________________________   Eppie Gibson, MD

## 2015-06-18 DIAGNOSIS — D692 Other nonthrombocytopenic purpura: Secondary | ICD-10-CM | POA: Diagnosis not present

## 2015-06-19 ENCOUNTER — Encounter (HOSPITAL_BASED_OUTPATIENT_CLINIC_OR_DEPARTMENT_OTHER): Payer: Self-pay | Admitting: *Deleted

## 2015-06-19 ENCOUNTER — Encounter (HOSPITAL_BASED_OUTPATIENT_CLINIC_OR_DEPARTMENT_OTHER)
Admission: RE | Admit: 2015-06-19 | Discharge: 2015-06-19 | Disposition: A | Discharge status: Home / Self Care | Payer: Medicare Other | Source: Ambulatory Visit | Attending: Surgery | Admitting: Surgery

## 2015-06-19 DIAGNOSIS — Z8673 Personal history of transient ischemic attack (TIA), and cerebral infarction without residual deficits: Secondary | ICD-10-CM | POA: Diagnosis not present

## 2015-06-19 DIAGNOSIS — K219 Gastro-esophageal reflux disease without esophagitis: Secondary | ICD-10-CM | POA: Diagnosis not present

## 2015-06-19 DIAGNOSIS — E039 Hypothyroidism, unspecified: Secondary | ICD-10-CM | POA: Diagnosis not present

## 2015-06-19 DIAGNOSIS — N6489 Other specified disorders of breast: Secondary | ICD-10-CM | POA: Diagnosis not present

## 2015-06-19 DIAGNOSIS — C50912 Malignant neoplasm of unspecified site of left female breast: Secondary | ICD-10-CM | POA: Diagnosis not present

## 2015-06-19 DIAGNOSIS — R008 Other abnormalities of heart beat: Secondary | ICD-10-CM | POA: Diagnosis not present

## 2015-06-19 DIAGNOSIS — Z885 Allergy status to narcotic agent status: Secondary | ICD-10-CM | POA: Diagnosis not present

## 2015-06-19 DIAGNOSIS — Z882 Allergy status to sulfonamides status: Secondary | ICD-10-CM | POA: Diagnosis not present

## 2015-06-19 DIAGNOSIS — N641 Fat necrosis of breast: Secondary | ICD-10-CM | POA: Diagnosis not present

## 2015-06-19 DIAGNOSIS — Z803 Family history of malignant neoplasm of breast: Secondary | ICD-10-CM | POA: Diagnosis not present

## 2015-06-19 DIAGNOSIS — I10 Essential (primary) hypertension: Secondary | ICD-10-CM | POA: Diagnosis not present

## 2015-06-19 DIAGNOSIS — Z881 Allergy status to other antibiotic agents status: Secondary | ICD-10-CM | POA: Diagnosis not present

## 2015-06-19 DIAGNOSIS — Z8249 Family history of ischemic heart disease and other diseases of the circulatory system: Secondary | ICD-10-CM | POA: Diagnosis not present

## 2015-06-19 DIAGNOSIS — Z7902 Long term (current) use of antithrombotics/antiplatelets: Secondary | ICD-10-CM | POA: Diagnosis not present

## 2015-06-19 DIAGNOSIS — Z7982 Long term (current) use of aspirin: Secondary | ICD-10-CM | POA: Diagnosis not present

## 2015-06-19 DIAGNOSIS — Z853 Personal history of malignant neoplasm of breast: Secondary | ICD-10-CM | POA: Diagnosis not present

## 2015-06-19 DIAGNOSIS — Z888 Allergy status to other drugs, medicaments and biological substances status: Secondary | ICD-10-CM | POA: Diagnosis not present

## 2015-06-19 DIAGNOSIS — N6012 Diffuse cystic mastopathy of left breast: Secondary | ICD-10-CM | POA: Diagnosis not present

## 2015-06-19 DIAGNOSIS — N6092 Unspecified benign mammary dysplasia of left breast: Secondary | ICD-10-CM | POA: Diagnosis not present

## 2015-06-19 LAB — COMPREHENSIVE METABOLIC PANEL
ALT: 42 U/L (ref 14–54)
ANION GAP: 8 (ref 5–15)
AST: 35 U/L (ref 15–41)
Albumin: 4 g/dL (ref 3.5–5.0)
Alkaline Phosphatase: 78 U/L (ref 38–126)
BILIRUBIN TOTAL: 0.6 mg/dL (ref 0.3–1.2)
BUN: 20 mg/dL (ref 6–20)
CO2: 27 mmol/L (ref 22–32)
Calcium: 9.6 mg/dL (ref 8.9–10.3)
Chloride: 104 mmol/L (ref 101–111)
Creatinine, Ser: 0.82 mg/dL (ref 0.44–1.00)
GFR calc Af Amer: >60 mL/min (ref >60)
Glucose, Bld: 112 mg/dL — ABNORMAL HIGH (ref 65–99)
POTASSIUM: 4.1 mmol/L (ref 3.5–5.1)
Sodium: 139 mmol/L (ref 135–145)
TOTAL PROTEIN: 7.3 g/dL (ref 6.5–8.1)

## 2015-06-19 LAB — CBC WITH DIFFERENTIAL/PLATELET
Basophils Absolute: 0 10*3/uL (ref 0.0–0.1)
Basophils Relative: 0 % (ref 0–1)
Eosinophils Absolute: 0.1 10*3/uL (ref 0.0–0.7)
Eosinophils Relative: 1 % (ref 0–5)
HEMATOCRIT: 39.9 % (ref 36.0–46.0)
Hemoglobin: 13.3 g/dL (ref 12.0–15.0)
LYMPHS ABS: 2.2 10*3/uL (ref 0.7–4.0)
LYMPHS PCT: 29 % (ref 12–46)
MCH: 30.2 pg (ref 26.0–34.0)
MCHC: 33.3 g/dL (ref 30.0–36.0)
MCV: 90.7 fL (ref 78.0–100.0)
Monocytes Absolute: 0.6 10*3/uL (ref 0.1–1.0)
Monocytes Relative: 7 % (ref 3–12)
NEUTROS ABS: 4.7 10*3/uL (ref 1.7–7.7)
Neutrophils Relative %: 63 % (ref 43–77)
Platelets: 236 10*3/uL (ref 150–400)
RBC: 4.4 MIL/uL (ref 3.87–5.11)
RDW: 12.9 % (ref 11.5–15.5)
WBC: 7.6 10*3/uL (ref 4.0–10.5)

## 2015-06-21 ENCOUNTER — Ambulatory Visit (HOSPITAL_BASED_OUTPATIENT_CLINIC_OR_DEPARTMENT_OTHER)
Admission: RE | Admit: 2015-06-21 | Discharge: 2015-06-21 | Disposition: A | Discharge status: Home / Self Care | Payer: Medicare Other | Source: Ambulatory Visit | Attending: Surgery | Admitting: Surgery

## 2015-06-21 ENCOUNTER — Encounter (HOSPITAL_BASED_OUTPATIENT_CLINIC_OR_DEPARTMENT_OTHER)
Admission: RE | Disposition: A | Discharge status: Home / Self Care | Payer: Self-pay | Source: Ambulatory Visit | Attending: Surgery

## 2015-06-21 ENCOUNTER — Ambulatory Visit (HOSPITAL_BASED_OUTPATIENT_CLINIC_OR_DEPARTMENT_OTHER): Payer: Medicare Other | Admitting: Anesthesiology

## 2015-06-21 ENCOUNTER — Encounter (HOSPITAL_BASED_OUTPATIENT_CLINIC_OR_DEPARTMENT_OTHER): Payer: Self-pay | Admitting: *Deleted

## 2015-06-21 DIAGNOSIS — N6092 Unspecified benign mammary dysplasia of left breast: Secondary | ICD-10-CM | POA: Diagnosis not present

## 2015-06-21 DIAGNOSIS — N641 Fat necrosis of breast: Secondary | ICD-10-CM | POA: Insufficient documentation

## 2015-06-21 DIAGNOSIS — Z8673 Personal history of transient ischemic attack (TIA), and cerebral infarction without residual deficits: Secondary | ICD-10-CM | POA: Insufficient documentation

## 2015-06-21 DIAGNOSIS — R008 Other abnormalities of heart beat: Secondary | ICD-10-CM | POA: Insufficient documentation

## 2015-06-21 DIAGNOSIS — Z885 Allergy status to narcotic agent status: Secondary | ICD-10-CM | POA: Insufficient documentation

## 2015-06-21 DIAGNOSIS — Z7982 Long term (current) use of aspirin: Secondary | ICD-10-CM | POA: Insufficient documentation

## 2015-06-21 DIAGNOSIS — D72829 Elevated white blood cell count, unspecified: Secondary | ICD-10-CM | POA: Diagnosis not present

## 2015-06-21 DIAGNOSIS — I10 Essential (primary) hypertension: Secondary | ICD-10-CM | POA: Insufficient documentation

## 2015-06-21 DIAGNOSIS — Z803 Family history of malignant neoplasm of breast: Secondary | ICD-10-CM | POA: Insufficient documentation

## 2015-06-21 DIAGNOSIS — E039 Hypothyroidism, unspecified: Secondary | ICD-10-CM | POA: Insufficient documentation

## 2015-06-21 DIAGNOSIS — Z888 Allergy status to other drugs, medicaments and biological substances status: Secondary | ICD-10-CM | POA: Insufficient documentation

## 2015-06-21 DIAGNOSIS — Z8249 Family history of ischemic heart disease and other diseases of the circulatory system: Secondary | ICD-10-CM | POA: Insufficient documentation

## 2015-06-21 DIAGNOSIS — K219 Gastro-esophageal reflux disease without esophagitis: Secondary | ICD-10-CM | POA: Insufficient documentation

## 2015-06-21 DIAGNOSIS — N6012 Diffuse cystic mastopathy of left breast: Secondary | ICD-10-CM | POA: Insufficient documentation

## 2015-06-21 DIAGNOSIS — N6489 Other specified disorders of breast: Secondary | ICD-10-CM | POA: Diagnosis not present

## 2015-06-21 DIAGNOSIS — C50912 Malignant neoplasm of unspecified site of left female breast: Secondary | ICD-10-CM | POA: Insufficient documentation

## 2015-06-21 DIAGNOSIS — Z881 Allergy status to other antibiotic agents status: Secondary | ICD-10-CM | POA: Insufficient documentation

## 2015-06-21 DIAGNOSIS — Z853 Personal history of malignant neoplasm of breast: Secondary | ICD-10-CM | POA: Insufficient documentation

## 2015-06-21 DIAGNOSIS — Z882 Allergy status to sulfonamides status: Secondary | ICD-10-CM | POA: Insufficient documentation

## 2015-06-21 DIAGNOSIS — Z7902 Long term (current) use of antithrombotics/antiplatelets: Secondary | ICD-10-CM | POA: Insufficient documentation

## 2015-06-21 HISTORY — PX: RE-EXCISION OF BREAST LUMPECTOMY: SHX6048

## 2015-06-21 LAB — POCT HEMOGLOBIN-HEMACUE: HEMOGLOBIN: 13.7 g/dL (ref 12.0–15.0)

## 2015-06-21 SURGERY — EXCISION, LESION, BREAST
Anesthesia: General | Site: Breast | Laterality: Left

## 2015-06-21 MED ORDER — SCOPOLAMINE 1 MG/3DAYS TD PT72: 1.0000 | MEDICATED_PATCH | Freq: Once | TRANSDERMAL | Status: DC | PRN

## 2015-06-21 MED ORDER — DEXAMETHASONE SODIUM PHOSPHATE 4 MG/ML IJ SOLN
INTRAMUSCULAR | Status: DC | PRN
  Administered 2015-06-21: 10 mg via INTRAVENOUS

## 2015-06-21 MED ORDER — PROPOFOL 10 MG/ML IV BOLUS
INTRAVENOUS | Status: DC | PRN
  Administered 2015-06-21: 100 mg via INTRAVENOUS

## 2015-06-21 MED ORDER — MIDAZOLAM HCL 2 MG/2ML IJ SOLN: 1.0000 mg | INTRAMUSCULAR | Status: DC | PRN

## 2015-06-21 MED ORDER — LACTATED RINGERS IV SOLN
INTRAVENOUS | Status: DC
  Administered 2015-06-21: 15:00:00 via INTRAVENOUS

## 2015-06-21 MED ORDER — HYDROCODONE-ACETAMINOPHEN 5-325 MG PO TABS: 1.0000 | ORAL_TABLET | Freq: Four times a day (QID) | ORAL | Status: DC | PRN

## 2015-06-21 MED ORDER — LIDOCAINE HCL (CARDIAC) 20 MG/ML IV SOLN
INTRAVENOUS | Status: DC | PRN
  Administered 2015-06-21: 80 mg via INTRAVENOUS

## 2015-06-21 MED ORDER — FENTANYL CITRATE (PF) 100 MCG/2ML IJ SOLN
INTRAMUSCULAR | Status: AC
  Filled 2015-06-21: qty 4

## 2015-06-21 MED ORDER — CEFAZOLIN SODIUM-DEXTROSE 2-3 GM-% IV SOLR
INTRAVENOUS | Status: AC
  Filled 2015-06-21: qty 50

## 2015-06-21 MED ORDER — DEXTROSE 5 % IV SOLN
3.0000 g | INTRAVENOUS | Status: AC
  Administered 2015-06-21: 2 g via INTRAVENOUS

## 2015-06-21 MED ORDER — FENTANYL CITRATE (PF) 100 MCG/2ML IJ SOLN
50.0000 ug | INTRAMUSCULAR | Status: AC | PRN
  Administered 2015-06-21 (×3): 25 ug via INTRAVENOUS

## 2015-06-21 MED ORDER — BUPIVACAINE-EPINEPHRINE 0.25% -1:200000 IJ SOLN
INTRAMUSCULAR | Status: DC | PRN
  Administered 2015-06-21: 10 mL

## 2015-06-21 MED ORDER — ONDANSETRON HCL 4 MG/2ML IJ SOLN
INTRAMUSCULAR | Status: DC | PRN
  Administered 2015-06-21: 4 mg via INTRAVENOUS

## 2015-06-21 MED ORDER — ONDANSETRON HCL 4 MG/2ML IJ SOLN: 4.0000 mg | Freq: Once | INTRAMUSCULAR | Status: DC | PRN

## 2015-06-21 MED ORDER — FENTANYL CITRATE (PF) 100 MCG/2ML IJ SOLN
25.0000 ug | INTRAMUSCULAR | Status: DC | PRN
  Administered 2015-06-21 (×2): 25 ug via INTRAVENOUS

## 2015-06-21 MED ORDER — FENTANYL CITRATE (PF) 100 MCG/2ML IJ SOLN
INTRAMUSCULAR | Status: AC
  Filled 2015-06-21: qty 2

## 2015-06-21 MED ORDER — GLYCOPYRROLATE 0.2 MG/ML IJ SOLN
0.2000 mg | Freq: Once | INTRAMUSCULAR | Status: AC | PRN
  Administered 2015-06-21: 0.2 mg via INTRAVENOUS

## 2015-06-21 MED ORDER — 0.9 % SODIUM CHLORIDE (POUR BTL) OPTIME
TOPICAL | Status: DC | PRN
  Administered 2015-06-21: 1000 mL

## 2015-06-21 SURGICAL SUPPLY — 48 items
APPLIER CLIP 9.375 MED OPEN (MISCELLANEOUS) ×3
BINDER BREAST LRG (GAUZE/BANDAGES/DRESSINGS) ×3 IMPLANT
BINDER BREAST MEDIUM (GAUZE/BANDAGES/DRESSINGS) IMPLANT
BINDER BREAST XLRG (GAUZE/BANDAGES/DRESSINGS) IMPLANT
BINDER BREAST XXLRG (GAUZE/BANDAGES/DRESSINGS) IMPLANT
BLADE SURG 15 STRL LF DISP TIS (BLADE) ×1 IMPLANT
BLADE SURG 15 STRL SS (BLADE) ×2
CANISTER SUCT 1200ML W/VALVE (MISCELLANEOUS) ×3 IMPLANT
CHLORAPREP W/TINT 26ML (MISCELLANEOUS) ×3 IMPLANT
CLIP APPLIE 9.375 MED OPEN (MISCELLANEOUS) ×1 IMPLANT
CLIP TI WIDE RED SMALL 6 (CLIP) IMPLANT
COVER BACK TABLE 60X90IN (DRAPES) ×3 IMPLANT
COVER MAYO STAND STRL (DRAPES) ×3 IMPLANT
DECANTER SPIKE VIAL GLASS SM (MISCELLANEOUS) IMPLANT
DEVICE DUBIN W/COMP PLATE 8390 (MISCELLANEOUS) IMPLANT
DRAPE LAPAROSCOPIC ABDOMINAL (DRAPES) IMPLANT
DRAPE LAPAROTOMY 100X72 PEDS (DRAPES) ×3 IMPLANT
DRAPE UTILITY XL STRL (DRAPES) ×3 IMPLANT
ELECT COATED BLADE 2.86 ST (ELECTRODE) ×3 IMPLANT
ELECT REM PT RETURN 9FT ADLT (ELECTROSURGICAL) ×3
ELECTRODE REM PT RTRN 9FT ADLT (ELECTROSURGICAL) ×1 IMPLANT
GLOVE BIO SURGEON STRL SZ 6.5 (GLOVE) ×2 IMPLANT
GLOVE BIO SURGEONS STRL SZ 6.5 (GLOVE) ×1
GLOVE BIOGEL PI IND STRL 7.0 (GLOVE) ×2 IMPLANT
GLOVE BIOGEL PI IND STRL 8 (GLOVE) ×1 IMPLANT
GLOVE BIOGEL PI INDICATOR 7.0 (GLOVE) ×4
GLOVE BIOGEL PI INDICATOR 8 (GLOVE) ×2
GLOVE ECLIPSE 8.0 STRL XLNG CF (GLOVE) ×3 IMPLANT
GOWN STRL REUS W/ TWL LRG LVL3 (GOWN DISPOSABLE) ×2 IMPLANT
GOWN STRL REUS W/TWL LRG LVL3 (GOWN DISPOSABLE) ×4
KIT MARKER MARGIN INK (KITS) ×3 IMPLANT
LIQUID BAND (GAUZE/BANDAGES/DRESSINGS) ×3 IMPLANT
NEEDLE HYPO 25X1 1.5 SAFETY (NEEDLE) ×3 IMPLANT
NS IRRIG 1000ML POUR BTL (IV SOLUTION) ×3 IMPLANT
PACK BASIN DAY SURGERY FS (CUSTOM PROCEDURE TRAY) ×3 IMPLANT
PENCIL BUTTON HOLSTER BLD 10FT (ELECTRODE) ×3 IMPLANT
SLEEVE SCD COMPRESS KNEE MED (MISCELLANEOUS) ×3 IMPLANT
SPONGE LAP 4X18 X RAY DECT (DISPOSABLE) ×3 IMPLANT
STAPLER VISISTAT 35W (STAPLE) IMPLANT
SUT MON AB 4-0 PC3 18 (SUTURE) ×3 IMPLANT
SUT SILK 2 0 SH (SUTURE) IMPLANT
SUT VICRYL 3-0 CR8 SH (SUTURE) ×3 IMPLANT
SYR CONTROL 10ML LL (SYRINGE) ×3 IMPLANT
TOWEL OR 17X24 6PK STRL BLUE (TOWEL DISPOSABLE) ×3 IMPLANT
TOWEL OR NON WOVEN STRL DISP B (DISPOSABLE) IMPLANT
TUBE CONNECTING 20'X1/4 (TUBING) ×1
TUBE CONNECTING 20X1/4 (TUBING) ×2 IMPLANT
YANKAUER SUCT BULB TIP NO VENT (SUCTIONS) ×3 IMPLANT

## 2015-06-21 NOTE — Interval H&P Note (Signed)
History and Physical Interval Note:  06/21/2015 2:18 PM  Courtney Shea  has presented today for surgery, with the diagnosis of Left Breast Cancer  The various methods of treatment have been discussed with the patient and family. After consideration of risks, benefits and other options for treatment, the patient has consented to  Procedure(s): LEFT BREAST RE EXCISION OF LUMPECTOMY (Left) as a surgical intervention .  The patient's history has been reviewed, patient examined, no change in status, stable for surgery.  I have reviewed the patient's chart and labs.  Questions were answered to the patient's satisfaction.     Drue Camera A.

## 2015-06-21 NOTE — Anesthesia Procedure Notes (Signed)
Procedure Name: LMA Insertion Date/Time: 06/21/2015 4:50 PM Performed by: Lyndee Leo Pre-anesthesia Checklist: Patient identified, Emergency Drugs available, Suction available and Patient being monitored Patient Re-evaluated:Patient Re-evaluated prior to inductionOxygen Delivery Method: Circle System Utilized Preoxygenation: Pre-oxygenation with 100% oxygen Intubation Type: IV induction Ventilation: Mask ventilation without difficulty LMA: LMA inserted LMA Size: 4.0 Number of attempts: 1 Airway Equipment and Method: Bite block Placement Confirmation: positive ETCO2 Tube secured with: Tape Dental Injury: Teeth and Oropharynx as per pre-operative assessment

## 2015-06-21 NOTE — Anesthesia Preprocedure Evaluation (Signed)
Anesthesia Evaluation  Patient identified by MRN, date of birth, ID band Patient awake    Reviewed: Allergy & Precautions, NPO status , Patient's Chart, lab work & pertinent test results  History of Anesthesia Complications Negative for: history of anesthetic complications  Airway Mallampati: II  TM Distance: >3 FB Neck ROM: Full    Dental no notable dental hx. (+) Dental Advisory Given   Pulmonary neg pulmonary ROS,  breath sounds clear to auscultation  Pulmonary exam normal       Cardiovascular hypertension, Pt. on medications and Pt. on home beta blockers Normal cardiovascular examRhythm:Regular Rate:Normal     Neuro/Psych TIAnegative psych ROS   GI/Hepatic negative GI ROS, Neg liver ROS, GERD-  ,  Endo/Other  Hypothyroidism   Renal/GU negative Renal ROS  negative genitourinary   Musculoskeletal negative musculoskeletal ROS (+)   Abdominal   Peds negative pediatric ROS (+)  Hematology negative hematology ROS (+)   Anesthesia Other Findings   Reproductive/Obstetrics negative OB ROS                             Anesthesia Physical Anesthesia Plan  ASA: III  Anesthesia Plan: General   Post-op Pain Management:    Induction: Intravenous  Airway Management Planned: LMA  Additional Equipment:   Intra-op Plan:   Post-operative Plan: Extubation in OR  Informed Consent: I have reviewed the patients History and Physical, chart, labs and discussed the procedure including the risks, benefits and alternatives for the proposed anesthesia with the patient or authorized representative who has indicated his/her understanding and acceptance.   Dental advisory given  Plan Discussed with: CRNA  Anesthesia Plan Comments:         Anesthesia Quick Evaluation

## 2015-06-21 NOTE — H&P (Signed)
Courtney Shea is an 81 y.o. female.   Chief Complaint: left breast cancer    HPI: pt underwent left breast lumpectomy for left   Breast cancer and she had focal positive anterior margin and close margins.  She is here today for re excision of positive margin.   Past Medical History  Diagnosis Date  . Hypertension   . Hypothyroid   . GERD (gastroesophageal reflux disease)   . Syncope   . Ventricular bigeminy   . Breast cancer of lower-outer quadrant of right female breast 05/07/2015  . Breast cancer   . Stroke     TIA, no deficits, placed on Plavix    Past Surgical History  Procedure Laterality Date  . Abdominal hysterectomy    . Ovarian cyst removed    . Incontinence surgery    . Bladder suspension    . Cataract extraction    . Partial mastectomy with needle localization Bilateral 05/29/2015    Procedure: LEFT BREAST PARTIAL MASTECTOMY WITH NEEDLE LOCALIZATION TIMES 2 AND RIGHT BREAST PARTIAL MASTECTOMY WITH NEEDLE LOCALIZATION;  Surgeon: Erroll Luna, MD;  Location: [place];  Service: General;  Laterality: Bilateral;    Family History  Problem Relation Age of Onset  . Coronary artery disease Mother     MI at age 63  . Breast cancer Sister 15  . Kidney disease Brother   . Cancer Sister     colon cancer suggested   Social History:  reports that she has never smoked. She has never used smokeless tobacco. She reports that she does not drink alcohol or use illicit drugs.  Allergies:  Allergies  Allergen Reactions  . Macrodantin [Nitrofurantoin Macrocrystal] Nausea Only    "extreme nausea"  . Codeine Nausea And Vomiting  . Crestor [Rosuvastatin Calcium] Other (See Comments)    "aching in legs"  . Doxycycline Itching and Rash  . Hydrochlorothiazide Rash    "all over body rash"  . Norvasc [Amlodipine Besylate] Swelling    "swelling in ankles"  . Sulfa Antibiotics Nausea And Vomiting    Medications Prior to Admission  Medication Sig Dispense  Refill  . aspirin EC 81 MG tablet Take 81 mg by mouth every evening.    . Calcium Carbonate-Vitamin D (CALCIUM 600 + D PO) Take 1 tablet by mouth every evening.     . cloNIDine (CATAPRES) 0.2 MG tablet Take 0.2 mg by mouth at bedtime.  11  . Cranberry 500 MG CAPS Take 500 mg by mouth daily.    . furosemide (LASIX) 20 MG tablet Take 20 mg by mouth See admin instructions. Mon. Wed, Friday    . HYDROcodone-acetaminophen (NORCO) 5-325 MG per tablet Take 1 tablet by mouth every 6 (six) hours as needed for moderate pain. 30 tablet 0  . levothyroxine (SYNTHROID, LEVOTHROID) 88 MCG tablet Take 88 mcg by mouth daily.    Marland Kitchen losartan (COZAAR) 100 MG tablet Take 100 mg by mouth daily.    . metoprolol succinate (TOPROL-XL) 50 MG 24 hr tablet Take 50 mg by mouth daily. Take with or immediately following a meal.    . pantoprazole (PROTONIX) 40 MG tablet Take 40 mg by mouth every evening.    . sennosides-docusate sodium (SENOKOT-S) 8.6-50 MG tablet Take 1 tablet by mouth daily as needed for constipation.     . clopidogrel (PLAVIX) 75 MG tablet Take 75 mg by mouth daily.    . ondansetron (ZOFRAN) 4 MG tablet Take 1 tablet (4 mg total) by mouth  every 8 (eight) hours as needed for nausea or vomiting. 20 tablet 0    No results found for this or any previous visit (from the past 48 hour(s)). No results found.  Review of Systems  All other systems reviewed and are negative.   Height 5\' 4"  (1.626 m), weight 76.658 kg (169 lb). Physical Exam  Cardiovascular: Normal rate.   Respiratory: Effort normal.    Skin: Skin is warm and dry.     Assessment/Plan Left breast cancer  Re excision left breast lumpectomy for positive anterior margin. The procedure has been discussed with the patient.  Alternative therapies have been discussed with the patient.  Operative risks include bleeding,  Infection,  Organ injury,  Nerve injury,  Blood vessel injury,  DVT,  Pulmonary embolism,  Death,  And possible reoperation.   Medical management risks include worsening of present situation.  The success of the procedure is 50 -90 % at treating patients symptoms.  The patient understands and agrees to proceed.  Taffany Heiser A. 06/21/2015, 2:15 PM

## 2015-06-21 NOTE — Interval H&P Note (Signed)
History and Physical Interval Note:  06/21/2015 3:37 PM  Courtney Shea  has presented today for surgery, with the diagnosis of Left Breast Cancer  The various methods of treatment have been discussed with the patient and family. After consideration of risks, benefits and other options for treatment, the patient has consented to  Procedure(s): LEFT BREAST RE EXCISION OF LUMPECTOMY (Left) as a surgical intervention .  The patient's history has been reviewed, patient examined, no change in status, stable for surgery.  I have reviewed the patient's chart and labs.  Questions were answered to the patient's satisfaction.     Pixie Burgener A.

## 2015-06-21 NOTE — Anesthesia Postprocedure Evaluation (Signed)
  Anesthesia Post-op Note  Patient: Courtney Shea  Procedure(s) Performed: Procedure(s): LEFT BREAST RE EXCISION OF LUMPECTOMY (Left)  Patient Location: PACU  Anesthesia Type:General  Level of Consciousness: awake  Airway and Oxygen Therapy: Patient Spontanous Breathing  Post-op Pain: mild  Post-op Assessment: Post-op Vital signs reviewed              Post-op Vital Signs: Reviewed  Last Vitals:  Filed Vitals:   06/21/15 1715  BP: 139/77  Pulse: 84  Temp:   Resp: 18    Complications: No apparent anesthesia complications

## 2015-06-21 NOTE — Discharge Instructions (Signed)
[facility] °Office Phone Number [phone] ° °BREAST BIOPSY/ PARTIAL MASTECTOMY: POST OP INSTRUCTIONS ° °Always review your discharge instruction sheet given to you by the facility where your surgery was performed. ° °IF YOU HAVE DISABILITY OR FAMILY LEAVE FORMS, YOU MUST BRING THEM TO THE OFFICE FOR PROCESSING.  DO NOT GIVE THEM TO YOUR DOCTOR. ° °1. A prescription for pain medication may be given to you upon discharge.  Take your pain medication as prescribed, if needed.  If narcotic pain medicine is not needed, then you may take acetaminophen (Tylenol) or ibuprofen (Advil) as needed. °2. Take your usually prescribed medications unless otherwise directed °3. If you need a refill on your pain medication, please contact your pharmacy.  They will contact our office to request authorization.  Prescriptions will not be filled after 5pm or on week-ends. °4. You should eat very light the first 24 hours after surgery, such as soup, crackers, pudding, etc.  Resume your normal diet the day after surgery. °5. Most patients will experience some swelling and bruising in the breast.  Ice packs and a good support bra will help.  Swelling and bruising can take several days to resolve.  °6. It is common to experience some constipation if taking pain medication after surgery.  Increasing fluid intake and taking a stool softener will usually help or prevent this problem from occurring.  A mild laxative (Milk of Magnesia or Miralax) should be taken according to package directions if there are no bowel movements after 48 hours. °7. Unless discharge instructions indicate otherwise, you may remove your bandages 24-48 hours after surgery, and you may shower at that time.  You may have steri-strips (small skin tapes) in place directly over the incision.  These strips should be left on the skin for 7-10 days.  If your surgeon used skin glue on the incision, you may shower in 24 hours.  The glue will flake off over the  next 2-3 weeks.  Any sutures or staples will be removed at the office during your follow-up visit. °8. ACTIVITIES:  You may resume regular daily activities (gradually increasing) beginning the next day.  Wearing a good support bra or sports bra minimizes pain and swelling.  You may have sexual intercourse when it is comfortable. °a. You may drive when you no longer are taking prescription pain medication, you can comfortably wear a seatbelt, and you can safely maneuver your car and apply brakes. °b. RETURN TO WORK:  ______________________________________________________________________________________ °9. You should see your doctor in the office for a follow-up appointment approximately two weeks after your surgery.  Your doctor’s nurse will typically make your follow-up appointment when she calls you with your pathology report.  Expect your pathology report 2-3 business days after your surgery.  You may call to check if you do not hear from us after three days. °10. OTHER INSTRUCTIONS: _______________________________________________________________________________________________ _____________________________________________________________________________________________________________________________________ °_____________________________________________________________________________________________________________________________________ °_____________________________________________________________________________________________________________________________________ ° °WHEN TO CALL YOUR DOCTOR: °1. Fever over 101.0 °2. Nausea and/or vomiting. °3. Extreme swelling or bruising. °4. Continued bleeding from incision. °5. Increased pain, redness, or drainage from the incision. ° °The clinic staff is available to answer your questions during regular business hours.  Please don’t hesitate to call and ask to speak to one of the nurses for clinical concerns.  If you have a medical emergency, go to the nearest  emergency room or call 911.  A surgeon from [facility] is always on call at the hospital. ° °For further questions, please visit [email or web address]  ° ° ° °  Post Anesthesia Home Care Instructions ° °Activity: °Get plenty of rest for the remainder of the day. A responsible adult should stay with you for 24 hours following the procedure.  °For the next 24 hours, DO NOT: °-Drive a car °-Operate machinery °-Drink alcoholic beverages °-Take any medication unless instructed by your physician °-Make any legal decisions or sign important papers. ° °Meals: °Start with liquid foods such as gelatin or soup. Progress to regular foods as tolerated. Avoid greasy, spicy, heavy foods. If nausea and/or vomiting occur, drink only clear liquids until the nausea and/or vomiting subsides. Call your physician if vomiting continues. ° °Special Instructions/Symptoms: °Your throat may feel dry or sore from the anesthesia or the breathing tube placed in your throat during surgery. If this causes discomfort, gargle with warm salt water. The discomfort should disappear within 24 hours. ° °If you had a scopolamine patch placed behind your ear for the management of post- operative nausea and/or vomiting: ° °1. The medication in the patch is effective for 72 hours, after which it should be removed.  Wrap patch in a tissue and discard in the trash. Wash hands thoroughly with soap and water. °2. You may remove the patch earlier than 72 hours if you experience unpleasant side effects which may include dry mouth, dizziness or visual disturbances. °3. Avoid touching the patch. Wash your hands with soap and water after contact with the patch. °  ° °

## 2015-06-21 NOTE — Op Note (Signed)
Breast Re-excison Lumpectomy Procedure Note left   Indications:  This patient returns following an initial lumpectomy.  Analysis of the pathology specimen revealed microscopic involvement of the anterior margins.  The patient now returns for re-excision.  Pre-operative Diagnosis: left breast cancer  Post-operative Diagnosis: left breast cancer  Surgeon: Erroll Luna A.   Assistants: none  Anesthesia: General LMA anesthesia and Local anesthesia 0.25.% bupivacaine, with epinephrine  ASA Class: 3  Procedure Details  The patient was seen in the Holding Room. The risks, benefits, complications, treatment options, and expected outcomes were discussed with the patient. The possibilities of reaction to medication, pulmonary aspiration, bleeding, infection, the need for additional procedures, failure to diagnose a condition, and creating a complication requiring transfusion or operation were discussed with the patient. The patient concurred with the proposed plan, giving informed consent.  The site of surgery properly noted/marked. The patient was taken to Operating Room # 8, identified as Courtney Shea and the procedure verified as left Breast Re-excision Lumpectomy. A Time Out was held and the above information confirmed.  The patient was placed supine.  The breast was prepped and draped in standard fashion. Marcaine 0.25% with epinephrine was used to anesthetize the skin around the previous lumpectomy incision.  The incision was opened.  A  seroma was evacuated.  Additional local anesthesia was delivered medial, lateral, posterior, anterior, inferior and superiorly within the lumpectomy cavity.  A full thickness re-excision was performed.  An orientation PAINT KIT  was placed USED AND ALL MARGINS WERE EXCISED.  The new margins were inked and the specimen was submitted to pathology.  Hemostasis was achieved with cautery.  Closure was performed in 2 layers with a 3-0 Vicryl  And 4 O monocryl  subcuticular closure.    Dermabond  applied.  At the end of the operation, all sponge, instrument and needle counts were correct.   Findings: grossly clear surgical margins  Estimated Blood Loss:  Minimal         Drains: none          Total IV Fluids: 500 ml         Specimens: all margins                   Complications:  None; patient tolerated the procedure well.         Disposition: PACU - hemodynamically stable.         Condition: stable  Attending Attestation: I performed the procedure.

## 2015-06-21 NOTE — Transfer of Care (Signed)
Immediate Anesthesia Transfer of Care Note  Patient: Courtney Shea  Procedure(s) Performed: Procedure(s): LEFT BREAST RE EXCISION OF LUMPECTOMY (Left)  Patient Location: PACU  Anesthesia Type:General  Level of Consciousness: awake, sedated and patient cooperative  Airway & Oxygen Therapy: Patient Spontanous Breathing and Patient connected to face mask oxygen  Post-op Assessment: Report given to RN and Post -op Vital signs reviewed and stable  Post vital signs: Reviewed and stable  Last Vitals:  Filed Vitals:   06/21/15 1426  BP: 127/100  Pulse: 97  Temp: 36.9 C  Resp: 20    Complications: No apparent anesthesia complications

## 2015-06-22 ENCOUNTER — Encounter (HOSPITAL_BASED_OUTPATIENT_CLINIC_OR_DEPARTMENT_OTHER): Payer: Self-pay | Admitting: Surgery

## 2015-07-03 ENCOUNTER — Encounter: Payer: Self-pay | Admitting: Radiation Oncology

## 2015-07-03 DIAGNOSIS — C50212 Malignant neoplasm of upper-inner quadrant of left female breast: Secondary | ICD-10-CM | POA: Insufficient documentation

## 2015-07-03 NOTE — Addendum Note (Signed)
Encounter addended by: Eppie Gibson, MD on: 07/03/2015  5:12 PM<BR>     Documentation filed: Notes Section

## 2015-07-18 ENCOUNTER — Telehealth: Payer: Self-pay | Admitting: Oncology

## 2015-07-18 NOTE — Telephone Encounter (Signed)
Patient aware of new appointments on 9/27

## 2015-07-23 ENCOUNTER — Other Ambulatory Visit: Payer: Self-pay | Admitting: Oncology

## 2015-07-23 ENCOUNTER — Ambulatory Visit
Admission: RE | Admit: 2015-07-23 | Discharge: 2015-07-23 | Disposition: A | Discharge status: Home / Self Care | Payer: Medicare Other | Source: Ambulatory Visit | Attending: Radiation Oncology | Admitting: Radiation Oncology

## 2015-07-23 ENCOUNTER — Other Ambulatory Visit: Payer: Self-pay | Admitting: *Deleted

## 2015-07-23 DIAGNOSIS — Z7982 Long term (current) use of aspirin: Secondary | ICD-10-CM | POA: Diagnosis not present

## 2015-07-23 DIAGNOSIS — Z170 Estrogen receptor positive status [ER+]: Secondary | ICD-10-CM | POA: Diagnosis not present

## 2015-07-23 DIAGNOSIS — C50212 Malignant neoplasm of upper-inner quadrant of left female breast: Secondary | ICD-10-CM

## 2015-07-23 DIAGNOSIS — Z510 Encounter for antineoplastic radiation therapy: Secondary | ICD-10-CM | POA: Diagnosis not present

## 2015-07-23 NOTE — Progress Notes (Signed)
  Radiation Oncology         [phone] ________________________________  Name: Courtney Shea MRN: [id number]  Date: 07/23/2015  DOB: Aug 19, 1930  SIMULATION AND TREATMENT PLANNING NOTE    Outpatient  DIAGNOSIS:     ICD-9-CM ICD-10-CM   1. 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  Identity was confirmed.  All relevant records and images related to the planned course of therapy were reviewed.  The patient freely provided informed written consent to proceed with treatment after reviewing the details related to the planned course of therapy. The consent form was witnessed and verified by the simulation staff.    Then, the patient was set-up in a stable reproducible supine position for radiation therapy with her ipsilateral arm over her head, and her upper body secured in a custom-made Vac-lok device.  CT images were obtained.  Surface markings were placed.  The CT images were loaded into the planning software.    TREATMENT PLANNING NOTE: Treatment planning then occurred.  The radiation prescription was entered and confirmed.     A total of 3 medically necessary complex treatment devices were fabricated and supervised by me: 2 fields with MLCs for custom blocks to protect heart, and lungs;  and, a Vac-lok. I have requested : 3D Simulation  I have requested a DVH of the following structures: lungs, heart, lumpectomy cavity.    The patient will receive 50 Gy in 25 fractions to the left breast and left axilla with 2 high tangential fields.    This will not be followed by a boost.  Optical Surface Tracking Plan:  Since intensity modulated radiotherapy (IMRT) and 3D conformal radiation treatment methods are predicated on accurate and precise positioning for treatment, intrafraction motion monitoring is medically necessary to ensure accurate and safe treatment delivery. The ability to  quantify intrafraction motion without excessive ionizing radiation dose can only be performed with optical surface tracking. Accordingly, surface imaging offers the opportunity to obtain 3D measurements of patient position throughout IMRT and 3D treatments without excessive radiation exposure. I am ordering optical surface tracking for this patient's upcoming course of radiotherapy.  ________________________________   Reference:  Ursula Alert, J, et al. Surface imaging-based analysis of intrafraction motion for breast radiotherapy patients.Journal of [place], n. 6, nov. 2014. ISSN [id number].  Available at: <http://www.jacmp.org/index.php/jacmp/article/view/4957>.    -----------------------------------  Eppie Gibson, MD

## 2015-07-24 ENCOUNTER — Other Ambulatory Visit (HOSPITAL_BASED_OUTPATIENT_CLINIC_OR_DEPARTMENT_OTHER): Payer: Medicare Other

## 2015-07-24 ENCOUNTER — Ambulatory Visit (HOSPITAL_BASED_OUTPATIENT_CLINIC_OR_DEPARTMENT_OTHER): Payer: Medicare Other | Admitting: Nurse Practitioner

## 2015-07-24 ENCOUNTER — Encounter: Payer: Medicare Other | Admitting: Oncology

## 2015-07-24 ENCOUNTER — Telehealth: Payer: Self-pay | Admitting: Nurse Practitioner

## 2015-07-24 ENCOUNTER — Telehealth: Payer: Self-pay | Admitting: *Deleted

## 2015-07-24 VITALS — BP 166/82 | HR 60 | Temp 97.6°F | Resp 18 | Ht 64.0 in | Wt 169.0 lb

## 2015-07-24 DIAGNOSIS — C50212 Malignant neoplasm of upper-inner quadrant of left female breast: Secondary | ICD-10-CM | POA: Diagnosis not present

## 2015-07-24 DIAGNOSIS — E2839 Other primary ovarian failure: Secondary | ICD-10-CM

## 2015-07-24 LAB — COMPREHENSIVE METABOLIC PANEL (CC13)
ALBUMIN: 4.2 g/dL (ref 3.5–5.0)
ALK PHOS: 85 U/L (ref 40–150)
ALT: 45 U/L (ref 0–55)
AST: 33 U/L (ref 5–34)
Anion Gap: 8 mEq/L (ref 3–11)
BUN: 25.8 mg/dL (ref 7.0–26.0)
CALCIUM: 9.9 mg/dL (ref 8.4–10.4)
CO2: 27 mEq/L (ref 22–29)
Chloride: 107 mEq/L (ref 98–109)
Creatinine: 0.9 mg/dL (ref 0.6–1.1)
EGFR: 58 mL/min/{1.73_m2} — ABNORMAL LOW (ref >90)
Glucose: 93 mg/dl (ref 70–140)
POTASSIUM: 4.7 meq/L (ref 3.5–5.1)
Sodium: 142 mEq/L (ref 136–145)
Total Bilirubin: 0.51 mg/dL (ref 0.20–1.20)
Total Protein: 7.3 g/dL (ref 6.4–8.3)

## 2015-07-24 LAB — CBC WITH DIFFERENTIAL/PLATELET
BASO%: 0.7 % (ref 0.0–2.0)
BASOS ABS: 0.1 10*3/uL (ref 0.0–0.1)
EOS ABS: 0.1 10*3/uL (ref 0.0–0.5)
EOS%: 1.3 % (ref 0.0–7.0)
HEMATOCRIT: 39.5 % (ref 34.8–46.6)
HEMOGLOBIN: 13.4 g/dL (ref 11.6–15.9)
LYMPH#: 2.3 10*3/uL (ref 0.9–3.3)
LYMPH%: 27.4 % (ref 14.0–49.7)
MCH: 30.4 pg (ref 25.1–34.0)
MCHC: 33.8 g/dL (ref 31.5–36.0)
MCV: 90 fL (ref 79.5–101.0)
MONO#: 0.8 10*3/uL (ref 0.1–0.9)
MONO%: 9.4 % (ref 0.0–14.0)
NEUT#: 5.2 10*3/uL (ref 1.5–6.5)
NEUT%: 61.2 % (ref 38.4–76.8)
Platelets: 225 10*3/uL (ref 145–400)
RBC: 4.39 10*6/uL (ref 3.70–5.45)
RDW: 13.5 % (ref 11.2–14.5)
WBC: 8.5 10*3/uL (ref 3.9–10.3)

## 2015-07-24 NOTE — Telephone Encounter (Signed)
Gave avs & calendar for January °

## 2015-07-24 NOTE — Telephone Encounter (Signed)
Called pt to advise her to get her flu vaccine this week before she starts radiation treatments on the 4th of October. Pt said she usually goes to [facility] to get it done so she will go on tomorrow.

## 2015-07-25 ENCOUNTER — Telehealth: Payer: Self-pay | Admitting: Oncology

## 2015-07-25 ENCOUNTER — Encounter: Payer: Self-pay | Admitting: Nurse Practitioner

## 2015-07-25 DIAGNOSIS — C50212 Malignant neoplasm of upper-inner quadrant of left female breast: Secondary | ICD-10-CM | POA: Diagnosis not present

## 2015-07-25 DIAGNOSIS — Z170 Estrogen receptor positive status [ER+]: Secondary | ICD-10-CM | POA: Diagnosis not present

## 2015-07-25 DIAGNOSIS — Z510 Encounter for antineoplastic radiation therapy: Secondary | ICD-10-CM | POA: Diagnosis not present

## 2015-07-25 DIAGNOSIS — Z7982 Long term (current) use of aspirin: Secondary | ICD-10-CM | POA: Diagnosis not present

## 2015-07-25 DIAGNOSIS — Z23 Encounter for immunization: Secondary | ICD-10-CM | POA: Diagnosis not present

## 2015-07-25 NOTE — Telephone Encounter (Signed)
Called and lefta message with dexa appointment

## 2015-07-25 NOTE — Progress Notes (Signed)
[facility]  [phone] [phone]     ID: SHANEKIA LATELLA DOB: 06/13/1930  MR#: [id number]  YBW#:[id number]  Patient Care Team: Josetta Huddle, MD as PCP - General (Internal Medicine) Chauncey Cruel, MD as Consulting Physician (Oncology) Eppie Gibson, MD as Attending Physician (Radiation Oncology) Erroll Luna, MD as Consulting Physician (General Surgery) Mauro Kaufmann, RN as Registered Nurse Rockwell Germany, RN as Registered Nurse Holley Bouche, NP as Nurse Practitioner (Nurse Practitioner) PCP: Henrine Screws, MD GYN: Dory Horn MD OTHER MD: Kirk Ruths MD, Wilhemina Bonito MD  CHIEF COMPLAINT: Estrogen receptor positive breast cancer  CURRENT TREATMENT: Awaiting radiation, to be followed by antiestrogen therapy.    BREAST CANCER HISTORY: The patient has had multiple diagnostic mammographies and ultrasonography his dating back to September 2013 following 2 areas in the left breast, one of the 2:00 position, the other at the 3:00 position. These were felt to be most likely fibroadenomatous. Repeat left ultrasonography 02/04/2013 showed them to be stable. On 04/11/2014 bilateral diagnostic mammography with ultrasonography found nothing on mammography, and by ultrasound the 2 masses previously described were again seen and were again unchanged.  On 04/16/2015, with bilateral diagnostic mammography with tomosynthesis and bilateral breast ultrasonography at the breast Center, the patient's breast density was read as category D. In the right breast there was an area of distortion in the lower outer quadrant. This was not palpable. By ultrasound there was a vague area of distortion measuring 0.9 cm. The right axilla was negative.  In the left breast upper inner quadrant there was an area of possible distortion with no discrete mass. The previously noted fibroadenomas were again seen.  On 04/27/2015 the patient underwent stereotactic biopsy of the  right lower outer quadrant area of distortion. This showed ([id number]) atypical lobular hyperplasia including a radial scar. On 05/03/2015 the patient underwent biopsy of two adjacent areas of architectural distortion in the upper inner left breast. This showed ([id number]) invasive lobular breast cancer in both biopsies, which were morphologically identical. Estrogen receptor was 95% positive. Progesterone receptor was 1% "positive". The proliferation marker was 5%. There was no HER-2 amplification, with a signals ratio of 1.52, the number per cell being 2.05.  On 05/08/2015 the patient underwent bilateral breast MRIs. In the right breast only post biopsy changes were seen. In the left breast there was a 3.27 m area of ill-defined enhancement and a second area measuring 1.3 cm, the whole measuring up to 4.0 cm at least when added together. There was a level I left axillary lymph node with minimal cortical thickening of uncertain significance.  The patient's subsequent history is as detailed below  INTERVAL HISTORY: Jennifer returns for follow up of her breast cancer, accompanied by her husband, Arco. She is status post a left lumpectomy and reexcision of this site because of close margins. She is healing well. She has had to use very little pain meds. She will begin radiation next week, and is here to discuss antiestrogen therapy, which is to begin afterwards.  REVIEW OF SYSTEMS: Pinki denies fevers, chills, nausea, or vomiting. She uses prune juice and colace for constipation. She sometimes has diarrhea with her diverticulosis. She eating and drinking well. She denies shortness of breath, chest pain, cough, or palpitations. She has no unusual headaches, dizziness, vision changes, or weakness. A detailed review of systems is otherwise stable.   PAST MEDICAL HISTORY: Past Medical History  Diagnosis Date  . Hypertension   .  Hypothyroid   . GERD (gastroesophageal reflux disease)   . Syncope   .  Ventricular bigeminy   . Breast cancer of lower-outer quadrant of right female breast 05/07/2015  . Breast cancer   . Stroke     TIA, no deficits, placed on Plavix    PAST SURGICAL HISTORY: Past Surgical History  Procedure Laterality Date  . Abdominal hysterectomy    . Ovarian cyst removed    . Incontinence surgery    . Bladder suspension    . Cataract extraction    . Partial mastectomy with needle localization Bilateral 05/29/2015    Procedure: LEFT BREAST PARTIAL MASTECTOMY WITH NEEDLE LOCALIZATION TIMES 2 AND RIGHT BREAST PARTIAL MASTECTOMY WITH NEEDLE LOCALIZATION;  Surgeon: Erroll Luna, MD;  Location: [facility];  Service: General;  Laterality: Bilateral;  . Re-excision of breast lumpectomy Left 06/21/2015    Procedure: LEFT BREAST RE EXCISION OF LUMPECTOMY;  Surgeon: Erroll Luna, MD;  Location: [facility];  Service: General;  Laterality: Left;    FAMILY HISTORY Family History  Problem Relation Age of Onset  . Coronary artery disease Mother     MI at age 108  . Breast cancer Sister 66  . Kidney disease Brother   . Cancer Sister     colon cancer suggested   the patient's father died at age 8, the patient's mother at age 32, both from strokes. The patient had one brother, 2 sisters. One sister was diagnosed with breast cancer at age 67. There is no history of ovarian cancer in the family.  GYNECOLOGIC HISTORY:  No LMP recorded. Patient is postmenopausal. Menarche age 30, first live birth age 89. The patient is GX P2. She underwent hysterectomy without salpingo-oophorectomy remotely, but then had bilateral salpingo-oophorectomy subsequently for removal ovarian cysts. She took hormone replacement for about 20 years, stopping in 2014.  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 Her husband [name] is a former Chief Financial Officer. Daughter [name] numb works for the [employer], orienting at risk students. Son  Rodman Key lives in [place] where he does research and development for [employer]. The patient has 3 grandchildren. She attends the [employer] (her son-in-law is Theme park manager.    ADVANCED DIRECTIVES: In place   HEALTH MAINTENANCE: Social History  Substance Use Topics  . Smoking status: Never Smoker   . Smokeless tobacco: Never Used  . Alcohol Use: No     Colonoscopy: 2011/[facility]  PAP: 2016  Bone density: 2001/normal  Lipid panel:  Allergies  Allergen Reactions  . Macrodantin [Nitrofurantoin Macrocrystal] Nausea Only    "extreme nausea"  . Codeine Nausea And Vomiting  . Crestor [Rosuvastatin Calcium] Other (See Comments)    "aching in legs"  . Doxycycline Itching and Rash  . Hydrochlorothiazide Rash    "all over body rash"  . Norvasc [Amlodipine Besylate] Swelling    "swelling in ankles"  . Sulfa Antibiotics Nausea And Vomiting    Current Outpatient Prescriptions  Medication Sig Dispense Refill  . aspirin EC 81 MG tablet Take 81 mg by mouth 2 (two) times daily.     . Calcium Carbonate-Vitamin D (CALCIUM 600 + D PO) Take 1 tablet by mouth every evening.     . clobetasol (TEMOVATE) 0.05 % external solution APPLY TO SCALP EVERY DAY  2  . cloNIDine (CATAPRES) 0.2 MG tablet Take 0.2 mg by mouth at bedtime.  11  . clopidogrel (PLAVIX) 75 MG tablet Take  75 mg by mouth daily.    . Cranberry 500 MG CAPS Take 500 mg by mouth daily.    . furosemide (LASIX) 20 MG tablet Take 20 mg by mouth daily.     Marland Kitchen levothyroxine (SYNTHROID, LEVOTHROID) 88 MCG tablet Take 88 mcg by mouth daily.    Marland Kitchen losartan (COZAAR) 100 MG tablet Take 100 mg by mouth daily.    . metoprolol succinate (TOPROL-XL) 50 MG 24 hr tablet Take 50 mg by mouth daily. Take with or immediately following a meal.    . pantoprazole (PROTONIX) 40 MG tablet Take 40 mg by mouth every evening.    Marland Kitchen HYDROcodone-acetaminophen (NORCO) 5-325 MG per tablet Take 1 tablet by mouth every 6 (six) hours as needed for moderate pain.  (Patient not taking: Reported on 07/24/2015) 30 tablet 0  . sennosides-docusate sodium (SENOKOT-S) 8.6-50 MG tablet Take 1 tablet by mouth daily as needed for constipation.      No current facility-administered medications for this visit.    OBJECTIVE: Older white woman in no acute distress Filed Vitals:   07/24/15 1313  BP: 166/82  Pulse: 60  Temp: 97.6 F (36.4 C)  Resp: 18     Body mass index is 28.99 kg/(m^2).    ECOG FS:0 - Asymptomatic  Skin: warm, dry  HEENT: sclerae anicteric, conjunctivae pink, oropharynx clear. No thrush or mucositis.  Lymph Nodes: No cervical or supraclavicular lymphadenopathy  Lungs: clear to auscultation bilaterally, no rales, wheezes, or rhonci  Heart: regular rate and rhythm  Abdomen: round, soft, non tender, positive bowel sounds  Musculoskeletal: No focal spinal tenderness, no peripheral edema  Neuro: non focal, well oriented, positive affect  Breasts: left breast status post lumpectomy. No skin or nipple changes. Incision line clean, dry, and well approximated. Right breast unremarkable.  LAB RESULTS:  CMP     Component Value Date/Time   NA 142 07/24/2015 1242   NA 139 06/19/2015 1144   K 4.7 07/24/2015 1242   K 4.1 06/19/2015 1144   CL 104 06/19/2015 1144   CO2 27 07/24/2015 1242   CO2 27 06/19/2015 1144   GLUCOSE 93 07/24/2015 1242   GLUCOSE 112* 06/19/2015 1144   BUN 25.8 07/24/2015 1242   BUN 20 06/19/2015 1144   CREATININE 0.9 07/24/2015 1242   CREATININE 0.82 06/19/2015 1144   CALCIUM 9.9 07/24/2015 1242   CALCIUM 9.6 06/19/2015 1144   PROT 7.3 07/24/2015 1242   PROT 7.3 06/19/2015 1144   ALBUMIN 4.2 07/24/2015 1242   ALBUMIN 4.0 06/19/2015 1144   AST 33 07/24/2015 1242   AST 35 06/19/2015 1144   ALT 45 07/24/2015 1242   ALT 42 06/19/2015 1144   ALKPHOS 85 07/24/2015 1242   ALKPHOS 78 06/19/2015 1144   BILITOT 0.51 07/24/2015 1242   BILITOT 0.6 06/19/2015 1144   GFRNONAA >60 06/19/2015 1144   GFRAA >60 06/19/2015 1144      INo results found for: SPEP, UPEP  Lab Results  Component Value Date   WBC 8.5 07/24/2015   NEUTROABS 5.2 07/24/2015   HGB 13.4 07/24/2015   HCT 39.5 07/24/2015   MCV 90.0 07/24/2015   PLT 225 07/24/2015      Chemistry      Component Value Date/Time   NA 142 07/24/2015 1242   NA 139 06/19/2015 1144   K 4.7 07/24/2015 1242   K 4.1 06/19/2015 1144   CL 104 06/19/2015 1144   CO2 27 07/24/2015 1242   CO2 27 06/19/2015 1144  BUN 25.8 07/24/2015 1242   BUN 20 06/19/2015 1144   CREATININE 0.9 07/24/2015 1242   CREATININE 0.82 06/19/2015 1144      Component Value Date/Time   CALCIUM 9.9 07/24/2015 1242   CALCIUM 9.6 06/19/2015 1144   ALKPHOS 85 07/24/2015 1242   ALKPHOS 78 06/19/2015 1144   AST 33 07/24/2015 1242   AST 35 06/19/2015 1144   ALT 45 07/24/2015 1242   ALT 42 06/19/2015 1144   BILITOT 0.51 07/24/2015 1242   BILITOT 0.6 06/19/2015 1144       No results found for: LABCA2  No components found for: LABCA125  No results for input(s): INR in the last 168 hours.  Urinalysis    Component Value Date/Time   COLORURINE YELLOW 06/20/2013 0910   APPEARANCEUR CLEAR 06/20/2013 0910   LABSPEC 1.004* 06/20/2013 0910   PHURINE 6.5 06/20/2013 0910   GLUCOSEU NEGATIVE 06/20/2013 0910   HGBUR NEGATIVE 06/20/2013 0910   BILIRUBINUR NEGATIVE 06/20/2013 0910   KETONESUR NEGATIVE 06/20/2013 0910   PROTEINUR NEGATIVE 06/20/2013 0910   UROBILINOGEN 0.2 06/20/2013 0910   NITRITE NEGATIVE 06/20/2013 0910   LEUKOCYTESUR TRACE* 06/20/2013 0910    STUDIES: No results found.  ASSESSMENT: 81 y.o. Courtney Shea woman status post left breast biopsy 05/03/2015 of 2 separate masses, clinically T2 N0, stage IIA invasive lobular breast cancer, grade 2, estrogen receptor positive, progesterone receptor 1% "positive", HER-2 not amplified  (a) right breast biopsy 04/27/2015 shows atypical lobular hyperplasia/complex sclerotic lesion  (1) status post left lumpectomy on 05/29/15 for  pT3, Nx, ER/PR positive, HER-2 negative, with close margins  (a) reexcision of left lumpectomy on 06/21/15 demonstrated several areas of fatty necrosis and a atypical lobular hyperplasia  (2) radiation to begin 07/31/15  (3) anastrozole to follow radiation   PLAN: Vinisha looks and feels well today. The labs were reviewed in detail and were entirely stable. She is ready to begin radiation next week. We discussed the fact that because her breast cancer "eats" estrogen, she will follow up with antiestrogen therapy for at least 5 years. We have chosen to begin anastrozole. We discussed the risks and benefits of this drug. She understands that hot flashes, vaginal dryness, arthralgias/myalgias, and bone thinning are all common side effects. She will need a baseline bone density scan performed. Her last one was in 2002 performed at [facility], so I am sending the patient back there for her next one.   I have sent the prescription to her pharmacy, but Reida will need to wait until the completion of radiation to begin the anastrozole. She will start 2 weeks after this date, which will be around 09/11/15. She will return in January for follow up to ensure that she is tolerating this drug well. She understands and agrees with this plan. She knows the goal of treatment in her case is cure. She has been encouraged to call with any issues that might arise before her next visi there.   Laurie Panda, NP   07/25/2015 8:52 AM

## 2015-07-30 ENCOUNTER — Ambulatory Visit
Admission: RE | Admit: 2015-07-30 | Discharge: 2015-07-30 | Disposition: A | Discharge status: Home / Self Care | Payer: Medicare Other | Source: Ambulatory Visit | Attending: Radiation Oncology | Admitting: Radiation Oncology

## 2015-07-30 DIAGNOSIS — Z510 Encounter for antineoplastic radiation therapy: Secondary | ICD-10-CM | POA: Diagnosis not present

## 2015-07-30 DIAGNOSIS — Z7982 Long term (current) use of aspirin: Secondary | ICD-10-CM | POA: Diagnosis not present

## 2015-07-30 DIAGNOSIS — C50212 Malignant neoplasm of upper-inner quadrant of left female breast: Secondary | ICD-10-CM | POA: Diagnosis not present

## 2015-07-30 DIAGNOSIS — Z9889 Other specified postprocedural states: Secondary | ICD-10-CM | POA: Diagnosis not present

## 2015-07-30 DIAGNOSIS — Z170 Estrogen receptor positive status [ER+]: Secondary | ICD-10-CM | POA: Diagnosis not present

## 2015-07-31 ENCOUNTER — Ambulatory Visit
Admission: RE | Admit: 2015-07-31 | Discharge: 2015-07-31 | Disposition: A | Discharge status: Home / Self Care | Payer: Medicare Other | Source: Ambulatory Visit | Attending: Radiation Oncology | Admitting: Radiation Oncology

## 2015-07-31 DIAGNOSIS — C50212 Malignant neoplasm of upper-inner quadrant of left female breast: Secondary | ICD-10-CM | POA: Diagnosis not present

## 2015-07-31 DIAGNOSIS — Z9889 Other specified postprocedural states: Secondary | ICD-10-CM | POA: Diagnosis not present

## 2015-07-31 DIAGNOSIS — Z510 Encounter for antineoplastic radiation therapy: Secondary | ICD-10-CM | POA: Diagnosis not present

## 2015-08-01 ENCOUNTER — Ambulatory Visit
Admission: RE | Admit: 2015-08-01 | Discharge: 2015-08-01 | Disposition: A | Discharge status: Home / Self Care | Payer: Medicare Other | Source: Ambulatory Visit | Attending: Radiation Oncology | Admitting: Radiation Oncology

## 2015-08-01 DIAGNOSIS — C50212 Malignant neoplasm of upper-inner quadrant of left female breast: Secondary | ICD-10-CM | POA: Diagnosis not present

## 2015-08-01 DIAGNOSIS — Z510 Encounter for antineoplastic radiation therapy: Secondary | ICD-10-CM | POA: Diagnosis not present

## 2015-08-01 DIAGNOSIS — Z9889 Other specified postprocedural states: Secondary | ICD-10-CM | POA: Diagnosis not present

## 2015-08-02 ENCOUNTER — Ambulatory Visit
Admission: RE | Admit: 2015-08-02 | Discharge: 2015-08-02 | Disposition: A | Discharge status: Home / Self Care | Payer: Medicare Other | Source: Ambulatory Visit | Attending: Radiation Oncology | Admitting: Radiation Oncology

## 2015-08-02 DIAGNOSIS — Z9889 Other specified postprocedural states: Secondary | ICD-10-CM | POA: Diagnosis not present

## 2015-08-02 DIAGNOSIS — C50212 Malignant neoplasm of upper-inner quadrant of left female breast: Secondary | ICD-10-CM | POA: Diagnosis not present

## 2015-08-02 DIAGNOSIS — Z510 Encounter for antineoplastic radiation therapy: Secondary | ICD-10-CM | POA: Diagnosis not present

## 2015-08-03 ENCOUNTER — Other Ambulatory Visit: Payer: Medicare Other

## 2015-08-03 ENCOUNTER — Ambulatory Visit
Admission: RE | Admit: 2015-08-03 | Discharge: 2015-08-03 | Disposition: A | Discharge status: Home / Self Care | Payer: Medicare Other | Source: Ambulatory Visit | Attending: Radiation Oncology | Admitting: Radiation Oncology

## 2015-08-03 DIAGNOSIS — Z9889 Other specified postprocedural states: Secondary | ICD-10-CM | POA: Diagnosis not present

## 2015-08-03 DIAGNOSIS — Z510 Encounter for antineoplastic radiation therapy: Secondary | ICD-10-CM | POA: Diagnosis not present

## 2015-08-03 DIAGNOSIS — C50212 Malignant neoplasm of upper-inner quadrant of left female breast: Secondary | ICD-10-CM | POA: Diagnosis not present

## 2015-08-06 ENCOUNTER — Encounter: Payer: Self-pay | Admitting: Radiation Oncology

## 2015-08-06 ENCOUNTER — Ambulatory Visit
Admission: RE | Admit: 2015-08-06 | Discharge: 2015-08-06 | Disposition: A | Discharge status: Home / Self Care | Payer: Medicare Other | Source: Ambulatory Visit | Attending: Radiation Oncology | Admitting: Radiation Oncology

## 2015-08-06 VITALS — BP 111/85 | HR 60 | Temp 97.9°F | Ht 64.0 in | Wt 167.1 lb

## 2015-08-06 DIAGNOSIS — C50212 Malignant neoplasm of upper-inner quadrant of left female breast: Secondary | ICD-10-CM

## 2015-08-06 DIAGNOSIS — Z510 Encounter for antineoplastic radiation therapy: Secondary | ICD-10-CM | POA: Insufficient documentation

## 2015-08-06 DIAGNOSIS — Z9889 Other specified postprocedural states: Secondary | ICD-10-CM | POA: Diagnosis not present

## 2015-08-06 MED ORDER — RADIAPLEXRX EX GEL
Freq: Once | CUTANEOUS | Status: AC
  Administered 2015-08-06: 18:00:00 via TOPICAL

## 2015-08-06 MED ORDER — ALRA NON-METALLIC DEODORANT (RAD-ONC)
1.0000 "application " | Freq: Once | TOPICAL | Status: AC
  Administered 2015-08-06: 1 via TOPICAL

## 2015-08-06 NOTE — Addendum Note (Signed)
Encounter addended by: Ernst Spell, RN on: 08/06/2015  6:26 PM<BR>     Documentation filed: Notes Section, Orders, Dx Association, Inpatient [facility], Inpatient Patient Education

## 2015-08-06 NOTE — Progress Notes (Signed)
Pt here for patient teaching.  Pt given Radiation and You booklet, skin care instructions, Alra deodorant and Radiaplex gel. Pt reports they have not watched the Radiation Therapy Education video, patient given link to watch at home on her computer}.  Reviewed areas of pertinence such as fatigue, hair loss, skin changes, breast tenderness, breast swelling, cough, shortness of breath, earaches and taste changes . Pt able to give teach back of to pat skin, use unscented/gentle soap and drink plenty of water,apply Radiaplex bid, avoid applying anything to skin within 4 hours of treatment, avoid wearing an under wire bra and to use an electric razor if they must shave. Pt demonstrated understanding and verbalizes understanding of information given and will contact nursing with any questions or concerns.

## 2015-08-06 NOTE — Progress Notes (Signed)
   Weekly Management Note:  outpatient    ICD-9-CM ICD-10-CM   1. Carcinoma of upper-inner quadrant of left female breast (HCC) 174.2 C50.212     Current Dose:  10 Gy  Projected Dose: 50 Gy   Narrative:  The patient presents for routine under treatment assessment.  CBCT/MVCT images/Port film x-rays were reviewed.  The chart was checked. No complaints  Physical Findings:  height is 5\' 4"  (1.626 m) and weight is 167 lb 1.6 oz (75.796 kg). Her temperature is 97.9 F (36.6 C). Her blood pressure is 111/85 and her pulse is 60.   Wt Readings from Last 3 Encounters:  08/06/15 167 lb 1.6 oz (75.796 kg)  07/24/15 169 lb (76.658 kg)  06/21/15 166 lb (75.297 kg)   No skin change over left breast  Impression:  The patient is tolerating radiotherapy.  Plan:  Continue radiotherapy as planned.    ________________________________   Eppie Gibson, M.D.

## 2015-08-06 NOTE — Progress Notes (Signed)
Ms. Courtney Shea is here for her 5th fraction to her left breast. She denies pain at this time and states her energy level is the same as before radiation. Her left breast has normal surgical changes but is not tender per patients report. BP 111/85 mmHg  Pulse 60  Temp(Src) 97.9 F (36.6 C)  Ht 5\' 4"  (1.626 m)  Wt 167 lb 1.6 oz (75.796 kg)  BMI 28.67 kg/m2  Wt Readings from Last 3 Encounters:  08/06/15 167 lb 1.6 oz (75.796 kg)  07/24/15 169 lb (76.658 kg)  06/21/15 166 lb (75.297 kg)

## 2015-08-06 NOTE — Addendum Note (Signed)
Encounter addended by: Ernst Spell, RN on: 08/06/2015  6:25 PM<BR>     Documentation filed: Dx Association, Inpatient MAR, Orders

## 2015-08-07 ENCOUNTER — Ambulatory Visit
Admission: RE | Admit: 2015-08-07 | Discharge: 2015-08-07 | Disposition: A | Discharge status: Home / Self Care | Payer: Medicare Other | Source: Ambulatory Visit | Attending: Radiation Oncology | Admitting: Radiation Oncology

## 2015-08-07 DIAGNOSIS — Z9889 Other specified postprocedural states: Secondary | ICD-10-CM | POA: Diagnosis not present

## 2015-08-07 DIAGNOSIS — C50212 Malignant neoplasm of upper-inner quadrant of left female breast: Secondary | ICD-10-CM | POA: Diagnosis not present

## 2015-08-07 DIAGNOSIS — Z510 Encounter for antineoplastic radiation therapy: Secondary | ICD-10-CM | POA: Diagnosis not present

## 2015-08-08 ENCOUNTER — Ambulatory Visit
Admission: RE | Admit: 2015-08-08 | Discharge: 2015-08-08 | Disposition: A | Discharge status: Home / Self Care | Payer: Medicare Other | Source: Ambulatory Visit | Attending: Radiation Oncology | Admitting: Radiation Oncology

## 2015-08-08 DIAGNOSIS — Z9889 Other specified postprocedural states: Secondary | ICD-10-CM | POA: Diagnosis not present

## 2015-08-08 DIAGNOSIS — Z510 Encounter for antineoplastic radiation therapy: Secondary | ICD-10-CM | POA: Diagnosis not present

## 2015-08-08 DIAGNOSIS — C50212 Malignant neoplasm of upper-inner quadrant of left female breast: Secondary | ICD-10-CM | POA: Diagnosis not present

## 2015-08-09 ENCOUNTER — Ambulatory Visit
Admission: RE | Admit: 2015-08-09 | Discharge: 2015-08-09 | Disposition: A | Discharge status: Home / Self Care | Payer: Medicare Other | Source: Ambulatory Visit | Attending: Radiation Oncology | Admitting: Radiation Oncology

## 2015-08-09 ENCOUNTER — Telehealth: Payer: Self-pay | Admitting: *Deleted

## 2015-08-09 DIAGNOSIS — Z510 Encounter for antineoplastic radiation therapy: Secondary | ICD-10-CM | POA: Diagnosis not present

## 2015-08-09 DIAGNOSIS — C50212 Malignant neoplasm of upper-inner quadrant of left female breast: Secondary | ICD-10-CM | POA: Diagnosis not present

## 2015-08-09 DIAGNOSIS — Z9889 Other specified postprocedural states: Secondary | ICD-10-CM | POA: Diagnosis not present

## 2015-08-09 NOTE — Telephone Encounter (Signed)
Left message with her husband for a return phone call to follow up after start of radiation.  Awaiting patient response.

## 2015-08-10 ENCOUNTER — Ambulatory Visit
Admission: RE | Admit: 2015-08-10 | Discharge: 2015-08-10 | Disposition: A | Discharge status: Home / Self Care | Payer: Medicare Other | Source: Ambulatory Visit | Attending: Radiation Oncology | Admitting: Radiation Oncology

## 2015-08-10 DIAGNOSIS — Z9889 Other specified postprocedural states: Secondary | ICD-10-CM | POA: Diagnosis not present

## 2015-08-10 DIAGNOSIS — Z510 Encounter for antineoplastic radiation therapy: Secondary | ICD-10-CM | POA: Diagnosis not present

## 2015-08-10 DIAGNOSIS — C50212 Malignant neoplasm of upper-inner quadrant of left female breast: Secondary | ICD-10-CM | POA: Diagnosis not present

## 2015-08-13 ENCOUNTER — Ambulatory Visit
Admission: RE | Admit: 2015-08-13 | Discharge: 2015-08-13 | Disposition: A | Discharge status: Home / Self Care | Payer: Medicare Other | Source: Ambulatory Visit | Attending: Radiation Oncology | Admitting: Radiation Oncology

## 2015-08-13 VITALS — BP 136/77 | HR 59 | Temp 98.1°F | Resp 18 | Ht 64.0 in | Wt 169.5 lb

## 2015-08-13 DIAGNOSIS — C50212 Malignant neoplasm of upper-inner quadrant of left female breast: Secondary | ICD-10-CM | POA: Diagnosis not present

## 2015-08-13 DIAGNOSIS — Z510 Encounter for antineoplastic radiation therapy: Secondary | ICD-10-CM | POA: Diagnosis not present

## 2015-08-13 DIAGNOSIS — Z9889 Other specified postprocedural states: Secondary | ICD-10-CM | POA: Diagnosis not present

## 2015-08-13 NOTE — Progress Notes (Signed)
Courtney Shea has completed 10 fractions to her left breast.  She denies pain and fatigue.  The skin on her left breast is intact.  She has not started to use radiaplex gel yet.  BP 136/77 mmHg  Pulse 59  Temp(Src) 98.1 F (36.7 C) (Oral)  Resp 18  Ht 5\' 4"  (1.626 m)  Wt 169 lb 8 oz (76.885 kg)  BMI 29.08 kg/m2

## 2015-08-13 NOTE — Progress Notes (Signed)
   Weekly Management Note:  outpatient    ICD-9-CM ICD-10-CM   1. Carcinoma of upper-inner quadrant of left female breast (HCC) 174.2 C50.212     Current Dose:  20 Gy  Projected Dose: 50 Gy   Narrative:  The patient presents for routine under treatment assessment.  CBCT/MVCT images/Port film x-rays were reviewed.  The chart was checked. No complaints other than dry skin  Physical Findings:  height is 5\' 4"  (1.626 m) and weight is 169 lb 8 oz (76.885 kg). Her oral temperature is 98.1 F (36.7 C). Her blood pressure is 136/77 and her pulse is 59. Her respiration is 18.   Wt Readings from Last 3 Encounters:  08/13/15 169 lb 8 oz (76.885 kg)  08/06/15 167 lb 1.6 oz (75.796 kg)  07/24/15 169 lb (76.658 kg)   Mild dryness, erythema of left breast  Impression:  The patient is tolerating radiotherapy.  Plan:  Continue radiotherapy as planned.  Radiaplex.   ________________________________   Eppie Gibson, M.D.

## 2015-08-14 ENCOUNTER — Ambulatory Visit
Admission: RE | Admit: 2015-08-14 | Discharge: 2015-08-14 | Disposition: A | Discharge status: Home / Self Care | Payer: Medicare Other | Source: Ambulatory Visit | Attending: Radiation Oncology | Admitting: Radiation Oncology

## 2015-08-14 DIAGNOSIS — Z9889 Other specified postprocedural states: Secondary | ICD-10-CM | POA: Diagnosis not present

## 2015-08-14 DIAGNOSIS — Z510 Encounter for antineoplastic radiation therapy: Secondary | ICD-10-CM | POA: Diagnosis not present

## 2015-08-14 DIAGNOSIS — C50212 Malignant neoplasm of upper-inner quadrant of left female breast: Secondary | ICD-10-CM | POA: Diagnosis not present

## 2015-08-15 ENCOUNTER — Ambulatory Visit
Admission: RE | Admit: 2015-08-15 | Discharge: 2015-08-15 | Disposition: A | Discharge status: Home / Self Care | Payer: Medicare Other | Source: Ambulatory Visit | Attending: Radiation Oncology | Admitting: Radiation Oncology

## 2015-08-15 DIAGNOSIS — Z9889 Other specified postprocedural states: Secondary | ICD-10-CM | POA: Diagnosis not present

## 2015-08-15 DIAGNOSIS — Z510 Encounter for antineoplastic radiation therapy: Secondary | ICD-10-CM | POA: Diagnosis not present

## 2015-08-15 DIAGNOSIS — C50212 Malignant neoplasm of upper-inner quadrant of left female breast: Secondary | ICD-10-CM | POA: Diagnosis not present

## 2015-08-16 ENCOUNTER — Ambulatory Visit
Admission: RE | Admit: 2015-08-16 | Discharge: 2015-08-16 | Disposition: A | Discharge status: Home / Self Care | Payer: Medicare Other | Source: Ambulatory Visit | Attending: Radiation Oncology | Admitting: Radiation Oncology

## 2015-08-16 DIAGNOSIS — Z9889 Other specified postprocedural states: Secondary | ICD-10-CM | POA: Diagnosis not present

## 2015-08-16 DIAGNOSIS — C50212 Malignant neoplasm of upper-inner quadrant of left female breast: Secondary | ICD-10-CM | POA: Diagnosis not present

## 2015-08-16 DIAGNOSIS — Z510 Encounter for antineoplastic radiation therapy: Secondary | ICD-10-CM | POA: Diagnosis not present

## 2015-08-17 ENCOUNTER — Ambulatory Visit
Admission: RE | Admit: 2015-08-17 | Discharge: 2015-08-17 | Disposition: A | Discharge status: Home / Self Care | Payer: Medicare Other | Source: Ambulatory Visit | Attending: Radiation Oncology | Admitting: Radiation Oncology

## 2015-08-17 DIAGNOSIS — C50212 Malignant neoplasm of upper-inner quadrant of left female breast: Secondary | ICD-10-CM | POA: Diagnosis not present

## 2015-08-17 DIAGNOSIS — Z9889 Other specified postprocedural states: Secondary | ICD-10-CM | POA: Diagnosis not present

## 2015-08-17 DIAGNOSIS — Z510 Encounter for antineoplastic radiation therapy: Secondary | ICD-10-CM | POA: Diagnosis not present

## 2015-08-20 ENCOUNTER — Encounter: Payer: Self-pay | Admitting: Radiation Oncology

## 2015-08-20 ENCOUNTER — Ambulatory Visit
Admission: RE | Admit: 2015-08-20 | Discharge: 2015-08-20 | Disposition: A | Discharge status: Home / Self Care | Payer: Medicare Other | Source: Ambulatory Visit | Attending: Radiation Oncology | Admitting: Radiation Oncology

## 2015-08-20 VITALS — BP 131/77 | HR 69 | Temp 97.8°F | Ht 64.0 in | Wt 167.4 lb

## 2015-08-20 DIAGNOSIS — C50212 Malignant neoplasm of upper-inner quadrant of left female breast: Secondary | ICD-10-CM | POA: Diagnosis not present

## 2015-08-20 DIAGNOSIS — Z510 Encounter for antineoplastic radiation therapy: Secondary | ICD-10-CM | POA: Diagnosis not present

## 2015-08-20 DIAGNOSIS — Z9889 Other specified postprocedural states: Secondary | ICD-10-CM | POA: Diagnosis not present

## 2015-08-20 NOTE — Progress Notes (Signed)
   Weekly Management Note:  outpatient    ICD-9-CM ICD-10-CM   1. Carcinoma of upper-inner quadrant of left female breast (HCC) 174.2 C50.212     Current Dose:  30 Gy  Projected Dose: 50 Gy   Narrative:  The patient presents for routine under treatment assessment.  CBCT/MVCT images/Port film x-rays were reviewed.  The chart was checked.  Courtney Shea is here for her 15th fraction of radiaton to her Left Breast. She denies any fatigue, and reports she sleeps well at night. She has a bruise over her right eye, which she states she woke up with Thursday morning, and is not sure how she got it. She also has other smaller bruises to her arm, she related these bruises, and her eye to her medicines Plavix and aspirin. She reports she is to see her primary doctor in November. Her Left breast is red, with itching to the upper, outer quadrant, and her lateral breast. She is using her radiaplex as directed.  She denies any feelings of being unsafe, denies any abuse.    Physical Findings:  height is 5\' 4"  (1.626 m) and weight is 167 lb 6.4 oz (75.932 kg). Her temperature is 97.8 F (36.6 C). Her blood pressure is 131/77 and her pulse is 69.   Wt Readings from Last 3 Encounters:  08/20/15 167 lb 6.4 oz (75.932 kg)  08/13/15 169 lb 8 oz (76.885 kg)  08/06/15 167 lb 1.6 oz (75.796 kg)   Mild dryness, erythema of left breast.  ecchymosis around right eye  Impression:  The patient is tolerating radiotherapy.  Plan:  Continue radiotherapy as planned.  Radiaplex. She will contact her PCP about blood thinners, bruising.  ________________________________   Eppie Gibson, M.D.

## 2015-08-20 NOTE — Progress Notes (Signed)
Ms. Courtney Shea is here for her 15th fraction of radiaton to her Left Breast. She denies any fatigue, and reports she sleeps well at night. She has a bruise over her right eye, which she states she woke up with Thursday morning, and is not sure how she got it. She also has other smaller bruises to her arm, she related these bruises, and her eye to her medicines Plavix and aspirin. She reports she is to see her primary doctor in November. Her Left breast is red, with itching to the upper, outer quadrant, and her lateral breast. She is using her radiaplex as directed.   BP 131/77 mmHg  Pulse 69  Temp(Src) 97.8 F (36.6 C)  Ht 5\' 4"  (1.626 m)  Wt 167 lb 6.4 oz (75.932 kg)  BMI 28.72 kg/m2   Wt Readings from Last 3 Encounters:  08/20/15 167 lb 6.4 oz (75.932 kg)  08/13/15 169 lb 8 oz (76.885 kg)  08/06/15 167 lb 1.6 oz (75.796 kg)

## 2015-08-21 ENCOUNTER — Ambulatory Visit
Admission: RE | Admit: 2015-08-21 | Discharge: 2015-08-21 | Disposition: A | Discharge status: Home / Self Care | Payer: Medicare Other | Source: Ambulatory Visit | Attending: Radiation Oncology | Admitting: Radiation Oncology

## 2015-08-21 DIAGNOSIS — C50212 Malignant neoplasm of upper-inner quadrant of left female breast: Secondary | ICD-10-CM | POA: Diagnosis not present

## 2015-08-21 DIAGNOSIS — Z9889 Other specified postprocedural states: Secondary | ICD-10-CM | POA: Diagnosis not present

## 2015-08-21 DIAGNOSIS — Z510 Encounter for antineoplastic radiation therapy: Secondary | ICD-10-CM | POA: Diagnosis not present

## 2015-08-22 ENCOUNTER — Ambulatory Visit
Admission: RE | Admit: 2015-08-22 | Discharge: 2015-08-22 | Disposition: A | Discharge status: Home / Self Care | Payer: Medicare Other | Source: Ambulatory Visit | Attending: Radiation Oncology | Admitting: Radiation Oncology

## 2015-08-22 DIAGNOSIS — Z510 Encounter for antineoplastic radiation therapy: Secondary | ICD-10-CM | POA: Diagnosis not present

## 2015-08-22 DIAGNOSIS — C50212 Malignant neoplasm of upper-inner quadrant of left female breast: Secondary | ICD-10-CM | POA: Diagnosis not present

## 2015-08-22 DIAGNOSIS — Z9889 Other specified postprocedural states: Secondary | ICD-10-CM | POA: Diagnosis not present

## 2015-08-23 ENCOUNTER — Ambulatory Visit
Admission: RE | Admit: 2015-08-23 | Discharge: 2015-08-23 | Disposition: A | Discharge status: Home / Self Care | Payer: Medicare Other | Source: Ambulatory Visit | Attending: Radiation Oncology | Admitting: Radiation Oncology

## 2015-08-23 DIAGNOSIS — Z510 Encounter for antineoplastic radiation therapy: Secondary | ICD-10-CM | POA: Diagnosis not present

## 2015-08-23 DIAGNOSIS — C50212 Malignant neoplasm of upper-inner quadrant of left female breast: Secondary | ICD-10-CM | POA: Diagnosis not present

## 2015-08-23 DIAGNOSIS — Z9889 Other specified postprocedural states: Secondary | ICD-10-CM | POA: Diagnosis not present

## 2015-08-24 ENCOUNTER — Ambulatory Visit
Admission: RE | Admit: 2015-08-24 | Discharge: 2015-08-24 | Disposition: A | Discharge status: Home / Self Care | Payer: Medicare Other | Source: Ambulatory Visit | Attending: Radiation Oncology | Admitting: Radiation Oncology

## 2015-08-24 DIAGNOSIS — Z510 Encounter for antineoplastic radiation therapy: Secondary | ICD-10-CM | POA: Diagnosis not present

## 2015-08-24 DIAGNOSIS — Z9889 Other specified postprocedural states: Secondary | ICD-10-CM | POA: Diagnosis not present

## 2015-08-24 DIAGNOSIS — C50212 Malignant neoplasm of upper-inner quadrant of left female breast: Secondary | ICD-10-CM | POA: Diagnosis not present

## 2015-08-27 ENCOUNTER — Encounter: Payer: Self-pay | Admitting: Radiation Oncology

## 2015-08-27 ENCOUNTER — Ambulatory Visit
Admission: RE | Admit: 2015-08-27 | Discharge: 2015-08-27 | Disposition: A | Discharge status: Home / Self Care | Payer: Medicare Other | Source: Ambulatory Visit | Attending: Radiation Oncology | Admitting: Radiation Oncology

## 2015-08-27 VITALS — BP 124/97 | HR 63 | Temp 97.9°F | Ht 64.0 in | Wt 168.4 lb

## 2015-08-27 DIAGNOSIS — Z510 Encounter for antineoplastic radiation therapy: Secondary | ICD-10-CM | POA: Diagnosis not present

## 2015-08-27 DIAGNOSIS — C50212 Malignant neoplasm of upper-inner quadrant of left female breast: Secondary | ICD-10-CM | POA: Diagnosis not present

## 2015-08-27 DIAGNOSIS — Z9889 Other specified postprocedural states: Secondary | ICD-10-CM | POA: Diagnosis not present

## 2015-08-27 MED ORDER — RADIAPLEXRX EX GEL
Freq: Once | CUTANEOUS | Status: AC
  Administered 2015-08-27: 13:00:00 via TOPICAL

## 2015-08-27 NOTE — Progress Notes (Signed)
   Weekly Management Note:  outpatient    ICD-9-CM ICD-10-CM   1. Carcinoma of upper-inner quadrant of left female breast (HCC) 174.2 C50.212 hyaluronate sodium (RADIAPLEXRX) gel    Current Dose: 40 Gy  Projected Dose: 50 Gy   Narrative:  The patient presents for routine under treatment assessment.  CBCT/MVCT images/Port film x-rays were reviewed.  The chart was checked.  Increased itching in left breast UIQ     Physical Findings:  height is 5\' 4"  (1.626 m) and weight is 168 lb 6.4 oz (76.386 kg). Her temperature is 97.9 F (36.6 C). Her blood pressure is 124/97 and her pulse is 63.   Wt Readings from Last 3 Encounters:  08/27/15 168 lb 6.4 oz (76.386 kg)  08/20/15 167 lb 6.4 oz (75.932 kg)  08/13/15 169 lb 8 oz (76.885 kg)  Dryness, erythema of left breast.  Dermatitis at UIQ. Resolving ecchymosis around right eye  Impression:  The patient is tolerating radiotherapy.  Plan:  Continue radiotherapy as planned.  Radiaplex.  Add hydrocortisone 1% cream at UIQ for itching  ________________________________   Eppie Gibson, M.D.

## 2015-08-27 NOTE — Progress Notes (Signed)
Courtney Shea is here for her 20th fraction of radiation to her Left Breast. She reports some mild fatigue, but reports it is manageable. She takes most rest periods during the day. She has redness underneath her Left axilla, and reports it is sore. She has a raised rash area to her upper anterior Left breast area which she reports itches occasionally. There is also dryness to her nipple area. She says she is using the radiaplex cream twice a day as directed.   BP 124/97 mmHg  Pulse 63  Temp(Src) 97.9 F (36.6 C)  Ht 5\' 4"  (1.626 m)  Wt 168 lb 6.4 oz (76.386 kg)  BMI 28.89 kg/m2

## 2015-08-28 ENCOUNTER — Ambulatory Visit
Admission: RE | Admit: 2015-08-28 | Discharge: 2015-08-28 | Disposition: A | Discharge status: Home / Self Care | Payer: Medicare Other | Source: Ambulatory Visit | Attending: Radiation Oncology | Admitting: Radiation Oncology

## 2015-08-28 DIAGNOSIS — Z9889 Other specified postprocedural states: Secondary | ICD-10-CM | POA: Diagnosis not present

## 2015-08-28 DIAGNOSIS — C50212 Malignant neoplasm of upper-inner quadrant of left female breast: Secondary | ICD-10-CM | POA: Diagnosis not present

## 2015-08-28 DIAGNOSIS — Z510 Encounter for antineoplastic radiation therapy: Secondary | ICD-10-CM | POA: Diagnosis not present

## 2015-08-29 ENCOUNTER — Ambulatory Visit
Admission: RE | Admit: 2015-08-29 | Discharge: 2015-08-29 | Disposition: A | Discharge status: Home / Self Care | Payer: Medicare Other | Source: Ambulatory Visit | Attending: Radiation Oncology | Admitting: Radiation Oncology

## 2015-08-29 DIAGNOSIS — C50212 Malignant neoplasm of upper-inner quadrant of left female breast: Secondary | ICD-10-CM | POA: Diagnosis not present

## 2015-08-29 DIAGNOSIS — Z510 Encounter for antineoplastic radiation therapy: Secondary | ICD-10-CM | POA: Diagnosis not present

## 2015-08-29 DIAGNOSIS — Z9889 Other specified postprocedural states: Secondary | ICD-10-CM | POA: Diagnosis not present

## 2015-08-30 ENCOUNTER — Ambulatory Visit
Admission: RE | Admit: 2015-08-30 | Discharge: 2015-08-30 | Disposition: A | Discharge status: Home / Self Care | Payer: Medicare Other | Source: Ambulatory Visit | Attending: Radiation Oncology | Admitting: Radiation Oncology

## 2015-08-30 DIAGNOSIS — Z9889 Other specified postprocedural states: Secondary | ICD-10-CM | POA: Diagnosis not present

## 2015-08-30 DIAGNOSIS — Z510 Encounter for antineoplastic radiation therapy: Secondary | ICD-10-CM | POA: Diagnosis not present

## 2015-08-30 DIAGNOSIS — C50212 Malignant neoplasm of upper-inner quadrant of left female breast: Secondary | ICD-10-CM | POA: Diagnosis not present

## 2015-08-31 ENCOUNTER — Ambulatory Visit
Admission: RE | Admit: 2015-08-31 | Discharge: 2015-08-31 | Disposition: A | Discharge status: Home / Self Care | Payer: Medicare Other | Source: Ambulatory Visit | Attending: Radiation Oncology | Admitting: Radiation Oncology

## 2015-08-31 DIAGNOSIS — Z9889 Other specified postprocedural states: Secondary | ICD-10-CM | POA: Diagnosis not present

## 2015-08-31 DIAGNOSIS — Z510 Encounter for antineoplastic radiation therapy: Secondary | ICD-10-CM | POA: Diagnosis not present

## 2015-08-31 DIAGNOSIS — C50212 Malignant neoplasm of upper-inner quadrant of left female breast: Secondary | ICD-10-CM | POA: Diagnosis not present

## 2015-09-03 ENCOUNTER — Ambulatory Visit
Admission: RE | Admit: 2015-09-03 | Discharge: 2015-09-03 | Disposition: A | Discharge status: Home / Self Care | Payer: Medicare Other | Source: Ambulatory Visit | Attending: Radiation Oncology | Admitting: Radiation Oncology

## 2015-09-03 ENCOUNTER — Telehealth: Payer: Self-pay | Admitting: *Deleted

## 2015-09-03 ENCOUNTER — Encounter: Payer: Self-pay | Admitting: Radiation Oncology

## 2015-09-03 VITALS — BP 141/64 | HR 54 | Temp 98.2°F | Ht 64.0 in | Wt 168.8 lb

## 2015-09-03 DIAGNOSIS — C50212 Malignant neoplasm of upper-inner quadrant of left female breast: Secondary | ICD-10-CM | POA: Diagnosis not present

## 2015-09-03 DIAGNOSIS — Z9889 Other specified postprocedural states: Secondary | ICD-10-CM | POA: Diagnosis not present

## 2015-09-03 DIAGNOSIS — Z510 Encounter for antineoplastic radiation therapy: Secondary | ICD-10-CM | POA: Diagnosis not present

## 2015-09-03 NOTE — Progress Notes (Signed)
Courtney Shea is here for her last treatment of radiation to her Left Breat. Her anterior left breast is red. Her axilla is slightly redder, and she reports this area is very tender. The area around her nipple is dry with small peeling noted, but she reports she has a skin condition, and it has been like that for many years. She is also red under her breast and also reports tenderness there. She is using the radiaplex as directed twice a day.   BP 141/64 mmHg  Pulse 54  Temp(Src) 98.2 F (36.8 C)  Ht 5\' 4"  (1.626 m)  Wt 168 lb 12.8 oz (76.567 kg)  BMI 28.96 kg/m2

## 2015-09-03 NOTE — Telephone Encounter (Signed)
Called to congratulate pt on completion of xrt. Relate doing well and without complaints. Denies questions or needs at this time. Relate Dr. Isidore Moos answered all her questions. Discussed survivorship program and referral. Encourage pt to call with concerns. Received verbal understanding. Contact information provided.

## 2015-09-03 NOTE — Progress Notes (Signed)
   Weekly Management Note  Outpatient    ICD-9-CM ICD-10-CM   1. Carcinoma of upper-inner quadrant of left female breast ([place]) 174.2 C50.212     Completed Radiotherapy. Total Dose: 50 Gy   Narrative:  The patient presents for routine under treatment assessment on last day of radiotherapy.  CBCT/MVCT images/Port film x-rays were reviewed.  The chart was checked. Doing well. More skin irritation  Physical Findings:  height is 5\' 4"  (1.626 m) and weight is 168 lb 12.8 oz (76.567 kg). Her temperature is 98.2 F (36.8 C). Her blood pressure is 141/64 and her pulse is 54.   Skin erythematous and intact over left breast.  punctate area of dry peeling at IM fold  Impression:  The patient has tolerated radiotherapy.  Plan:  Routine follow-up in one month. Neosporin PRN progressive skin peeling ________________________________   Eppie Gibson, M.D.

## 2015-09-04 ENCOUNTER — Ambulatory Visit
Admission: RE | Admit: 2015-09-04 | Discharge: 2015-09-04 | Disposition: A | Discharge status: Home / Self Care | Payer: Medicare Other | Source: Ambulatory Visit | Attending: Oncology | Admitting: Oncology

## 2015-09-04 ENCOUNTER — Other Ambulatory Visit: Payer: Self-pay | Admitting: Adult Health

## 2015-09-04 DIAGNOSIS — C50212 Malignant neoplasm of upper-inner quadrant of left female breast: Secondary | ICD-10-CM

## 2015-09-04 DIAGNOSIS — M25852 Other specified joint disorders, left hip: Secondary | ICD-10-CM | POA: Diagnosis not present

## 2015-09-04 DIAGNOSIS — E2839 Other primary ovarian failure: Secondary | ICD-10-CM

## 2015-09-04 DIAGNOSIS — M85852 Other specified disorders of bone density and structure, left thigh: Secondary | ICD-10-CM | POA: Diagnosis not present

## 2015-09-05 DIAGNOSIS — Z1389 Encounter for screening for other disorder: Secondary | ICD-10-CM | POA: Diagnosis not present

## 2015-09-05 DIAGNOSIS — I10 Essential (primary) hypertension: Secondary | ICD-10-CM | POA: Diagnosis not present

## 2015-09-05 DIAGNOSIS — K219 Gastro-esophageal reflux disease without esophagitis: Secondary | ICD-10-CM | POA: Diagnosis not present

## 2015-09-05 DIAGNOSIS — E559 Vitamin D deficiency, unspecified: Secondary | ICD-10-CM | POA: Diagnosis not present

## 2015-09-05 DIAGNOSIS — R251 Tremor, unspecified: Secondary | ICD-10-CM | POA: Diagnosis not present

## 2015-09-05 DIAGNOSIS — Z79899 Other long term (current) drug therapy: Secondary | ICD-10-CM | POA: Diagnosis not present

## 2015-09-05 DIAGNOSIS — Z0001 Encounter for general adult medical examination with abnormal findings: Secondary | ICD-10-CM | POA: Diagnosis not present

## 2015-09-05 DIAGNOSIS — M1990 Unspecified osteoarthritis, unspecified site: Secondary | ICD-10-CM | POA: Diagnosis not present

## 2015-09-05 DIAGNOSIS — G459 Transient cerebral ischemic attack, unspecified: Secondary | ICD-10-CM | POA: Diagnosis not present

## 2015-09-05 DIAGNOSIS — E039 Hypothyroidism, unspecified: Secondary | ICD-10-CM | POA: Diagnosis not present

## 2015-09-10 ENCOUNTER — Telehealth: Payer: Self-pay | Admitting: Oncology

## 2015-09-10 NOTE — Telephone Encounter (Signed)
Spoke with patient re SCP visit and mailed schedule.

## 2015-09-14 NOTE — Progress Notes (Signed)
  Radiation Oncology         (336) [phone] ________________________________  Name: Courtney Shea MRN: [id number]  Date: 09/03/2015  DOB: 10-Jul-1930  End of Treatment Note  Diagnosis: Left Breast Cancer, C50.212  Cancer of the left female breast, pathologic Stage II B pT3Nx, invasive lobular carcinoma, Grade I with LCIS and ALH, ER 95% PR 1%, HER2 negative; clinical Stage IIA T2N0M0.  Indication for treatment: Curative  Radiation treatment dates:   07/31/2015-09/03/2015  Site/dose: Left Breast. 50 Gy in 25 fractions.  Beams/energy: 3D-Conformal Other // 10X,6X  Narrative: The patient tolerated radiation treatment relatively well.  Plan: The patient has completed radiation treatment. The patient will return to radiation oncology clinic for routine followup in one month. I advised them to call or return sooner if they have any questions or concerns related to their recovery or treatment.  -----------------------------------  Eppie Gibson, MD  This document serves as a record of services personally performed by Eppie Gibson, MD. It was created on her behalf by Darcus Austin, a trained medical scribe. The creation of this record is based on the scribe's personal observations and the provider's statements to them. This document has been checked and approved by the attending provider.

## 2015-09-24 ENCOUNTER — Other Ambulatory Visit: Payer: Self-pay | Admitting: Internal Medicine

## 2015-09-24 ENCOUNTER — Ambulatory Visit
Admission: RE | Admit: 2015-09-24 | Discharge: 2015-09-24 | Disposition: A | Discharge status: Home / Self Care | Payer: Medicare Other | Source: Ambulatory Visit | Attending: Internal Medicine | Admitting: Internal Medicine

## 2015-09-24 DIAGNOSIS — M79651 Pain in right thigh: Secondary | ICD-10-CM

## 2015-09-24 DIAGNOSIS — M25561 Pain in right knee: Secondary | ICD-10-CM | POA: Diagnosis not present

## 2015-10-01 DIAGNOSIS — M5416 Radiculopathy, lumbar region: Secondary | ICD-10-CM | POA: Diagnosis not present

## 2015-10-01 DIAGNOSIS — M79651 Pain in right thigh: Secondary | ICD-10-CM | POA: Diagnosis not present

## 2015-10-01 DIAGNOSIS — M25561 Pain in right knee: Secondary | ICD-10-CM | POA: Diagnosis not present

## 2015-10-12 ENCOUNTER — Ambulatory Visit
Admission: RE | Admit: 2015-10-12 | Discharge: 2015-10-12 | Disposition: A | Discharge status: Home / Self Care | Payer: Medicare Other | Source: Ambulatory Visit | Attending: Radiation Oncology | Admitting: Radiation Oncology

## 2015-10-12 ENCOUNTER — Encounter: Payer: Self-pay | Admitting: Radiation Oncology

## 2015-10-12 VITALS — BP 144/81 | HR 59 | Temp 97.7°F | Ht 64.0 in | Wt 164.9 lb

## 2015-10-12 DIAGNOSIS — C50212 Malignant neoplasm of upper-inner quadrant of left female breast: Secondary | ICD-10-CM

## 2015-10-12 NOTE — Progress Notes (Signed)
Courtney Shea presents for follow up of radiation completed 09/03/15 to her Left Breast. She denies any pain at this time. She reports she still has some fatigue and states "some days are better than others". She does feels that it is improving slowly. The skin over her left breast is normal in appearance, there is some dry skin over her nipple area. She is using regular cream over this area that has cocoa butter in it.   BP 144/81 mmHg  Pulse 59  Temp(Src) 97.7 F (36.5 C)  Ht 5\' 4"  (1.626 m)  Wt 164 lb 14.4 oz (74.798 kg)  BMI 28.29 kg/m2   Wt Readings from Last 3 Encounters:  10/12/15 164 lb 14.4 oz (74.798 kg)  09/03/15 168 lb 12.8 oz (76.567 kg)  08/27/15 168 lb 6.4 oz (76.386 kg)

## 2015-10-12 NOTE — Progress Notes (Signed)
Radiation Oncology         (336) [phone] ________________________________  Name: Courtney Shea MRN: [id number]  Date: 10/12/2015  DOB: [birth year]  Follow-Up Visit Note  Outpatient  CC: GATES,ROBERT NEVILL, MD  Erroll Luna, MD  Diagnosis and Prior Radiotherapy: Left Breast Cancer, C50.212 Cancer of the left female breast, pathologic Stage II B pT3Nx, invasive lobular carcinoma, Grade I with LCIS and ALH, ER 95% PR 1%, HER2 negative; clinical Stage IIA T2N0M0.    ICD-9-CM ICD-10-CM   1. Carcinoma of upper-inner quadrant of left female breast ([place]) 174.2 C50.212    Radiation treatment dates:   07/31/2015-09/03/2015 Site/dose: Left Breast. 50 Gy in 25 fractions.  Narrative:  The patient returns today for routine follow-up of radiation completed 09/03/15 to her Left Breast. She denies any pain at this time. She reports she still has some fatigue and states "some days are better than others". She does feel that it is improving slowly. The skin over her left breast is normal in appearance, there is some dry skin over her nipple area. She is using regular cream over this area that has cocoa butter in it. She is not currently on anti estrogen pills. She will call medical oncology to see Dr. Jana Hakim and discuss beginning anti estrogen pills.                              ALLERGIES:  is allergic to macrodantin; codeine; crestor; doxycycline; hydrochlorothiazide; norvasc; and sulfa antibiotics.  Meds: Current Outpatient Prescriptions  Medication Sig Dispense Refill  . aspirin EC 81 MG tablet Take 81 mg by mouth once.     . Calcium Carbonate-Vitamin D (CALCIUM 600 + D PO) Take 1 tablet by mouth every evening.     . clobetasol (TEMOVATE) 0.05 % external solution APPLY TO SCALP EVERY DAY  2  . cloNIDine (CATAPRES) 0.2 MG tablet Take 0.2 mg by mouth at bedtime.  11  . clopidogrel (PLAVIX) 75 MG tablet Take 75 mg by mouth daily.    . Cranberry 500 MG CAPS Take 500 mg by mouth daily.    .  furosemide (LASIX) 20 MG tablet Take 20 mg by mouth daily.     Marland Kitchen levothyroxine (SYNTHROID, LEVOTHROID) 88 MCG tablet Take 88 mcg by mouth daily.    Marland Kitchen losartan (COZAAR) 100 MG tablet Take 100 mg by mouth daily.    . metoprolol succinate (TOPROL-XL) 50 MG 24 hr tablet Take 50 mg by mouth daily. Take with or immediately following a meal.    . pantoprazole (PROTONIX) 40 MG tablet Take 40 mg by mouth every evening.    . sennosides-docusate sodium (SENOKOT-S) 8.6-50 MG tablet Take 1 tablet by mouth daily as needed for constipation.      No current facility-administered medications for this encounter.    Physical Findings: The patient is in no acute distress. Patient is alert and oriented.  height is 5' 4"  (1.626 m) and weight is 164 lb 14.4 oz (74.798 kg). Her temperature is 97.7 F (36.5 C). Her blood pressure is 144/81 and her pulse is 59. .    Skin looks excellent with subtle residual hyperpigmentation over the left breast. Left inframammary fold is clear. No desquamation. Skin healing well. Some palpable thickening under the lumpectomy scar consistent with previous treatment.   Lab Findings: Lab Results  Component Value Date   WBC 8.5 07/24/2015   HGB 13.4 07/24/2015   HCT 39.5 07/24/2015  MCV 90.0 07/24/2015   PLT 225 07/24/2015    Radiographic Findings: Dg Femur, Min 2 Views Right  09/24/2015  CLINICAL DATA:  Distal right femoral pain and anterior knee pain. EXAM: RIGHT FEMUR 2 VIEWS COMPARISON:  None. FINDINGS: No acute osseous abnormality. Degenerative changes in the medial aspect of the knee joint are incidentally imaged. IMPRESSION: 1. No acute osseous abnormality. 2. Degenerative changes in the medial compartment of the knee, incidentally imaged. Electronically Signed   By: Lorin Picket M.D.   On: 09/24/2015 14:34    Impression/Plan: Healed well from RT.  I encouraged her to continue with yearly mammography and followup with medical oncology. I will see her back on an  as-needed basis. I have encouraged her to call if she has any issues or concerns in the future. I wished her the very best.    Patient has completed radiation treatment om November and is ready to begin anti estrogen therapy. I will call Dr. Jana Hakim to discuss this, as the patient does not currently have an appointment with him  for this.   _____________________________________   Eppie Gibson, MD   This document serves as a record of services personally performed by Eppie Gibson, MD. It was created on her behalf by Arlyce Harman, a trained medical scribe. The creation of this record is based on the scribe's personal observations and the provider's statements to them. This document has been checked and approved by the attending provider.

## 2015-10-25 ENCOUNTER — Ambulatory Visit (HOSPITAL_BASED_OUTPATIENT_CLINIC_OR_DEPARTMENT_OTHER): Payer: Medicare Other | Admitting: Nurse Practitioner

## 2015-10-25 ENCOUNTER — Encounter: Payer: Self-pay | Admitting: Nurse Practitioner

## 2015-10-25 VITALS — BP 168/84 | HR 70 | Temp 97.6°F | Resp 19 | Wt 166.1 lb

## 2015-10-25 DIAGNOSIS — C50511 Malignant neoplasm of lower-outer quadrant of right female breast: Secondary | ICD-10-CM

## 2015-10-25 DIAGNOSIS — C50912 Malignant neoplasm of unspecified site of left female breast: Secondary | ICD-10-CM

## 2015-10-25 DIAGNOSIS — R5383 Other fatigue: Secondary | ICD-10-CM | POA: Diagnosis not present

## 2015-10-25 DIAGNOSIS — M8580 Other specified disorders of bone density and structure, unspecified site: Secondary | ICD-10-CM | POA: Diagnosis not present

## 2015-10-25 MED ORDER — ANASTROZOLE 1 MG PO TABS: 1.0000 mg | ORAL_TABLET | Freq: Every day | ORAL | Status: DC

## 2015-10-25 NOTE — Progress Notes (Signed)
CLINIC:  Cancer Survivorship   REASON FOR VISIT:  Routine follow-up post-treatment for a recent history of breast cancer.  BRIEF ONCOLOGIC HISTORY:    Carcinoma of upper-inner quadrant of left female breast ([place])   04/16/2015 Mammogram Right breast: area of possible distorion in LOQ   04/16/2015 Breast US RIGHT breast: 0.9 cm vague area of distortion; LEFT breast: an area of possible distortion with no discrete mass. The previously noted fibroadenomas were again seen.   04/27/2015 Procedure RIGHT breast core needle bx: ALH with area of radial scar   05/03/2015 Initial Biopsy LEFT breast core needle bx of 2 adjacent lesions in upper inner breast: invasive lobular breast cancer in both biopsies, morphologically identical. ER+ (95%), PR+ (1%), HER2/neu neg (ratio 1.52), Ki67 5%   05/08/2015 Breast MRI 2 areas of enhancement in left breast: #1 (posterior): 2.5 x 3.2 x 3 cm and #2 (anterior) measures 1.2 x 1.3 x 1 cm - separated by 4 x 3 cm. Indeterminate level 1 LN in left axilla.   05/08/2015 Clinical Stage Stage IIA: T2 N0   05/29/2015 Definitive Surgery Bilateral lumpectomies ([place]): RIGHT: ALH, no malignancy. LEFT: ILC, grade 1, LCIS and ALH, anterior margin focally positive, HER2/neu repeated and remains negative (ratio 1.21)   05/29/2015 Pathologic Stage Stage IIB: pT3 pNx   06/21/2015 Surgery Re-excision of left breast anterior margin: no evidence of malignancy   07/31/2015 - 09/03/2015 Radiation Therapy Adjuvant RT Isidore Moos): Left Breast 50 Gy in 25 fractions.   09/11/2015 -  Anti-estrogen oral therapy Anastrozole 1 mg daily. Planned duration of therapy 5 years.     INTERVAL HISTORY:  Courtney Shea presents to the [facility] today for our initial meeting to review her survivorship care plan detailing her treatment course for breast cancer, as well as monitoring long-term side effects of that treatment, education regarding health maintenance, screening, and overall wellness and health  promotion.     Overall, Courtney Shea reports feeling well since completing her radiation therapy last month. She continues with fatigue but reports that the skin changes overlying her left breast are improving, although the skin remains slightly darker. She has noticed no mass or lesion within either breast. She has not yet begun her anastrozole as she did not have a prescription for it. She does report some right leg pain that was evaluated by Dr. Inda Merlin with plain films.  She was told it was likely inflammatory in nature and treated with pain medication and steroids.  The pain has improved.  She denies headache, cough, shortness of breath or other bone pain.  She has a good appetite and denies any weight loss.    REVIEW OF SYSTEMS:  General: Fatigue as above. Denies fever, chills, unintentional weight loss, or night sweats.  HEENT: Wears glasses. Denies visual changes, hearing loss, mouth sores or difficulty swallowing. Cardiac: Denies palpitations, chest pain, and lower extremity edema.  Respiratory: Denies wheeze or dyspnea on exertion.  Breast: Denies any new nodularity, masses, tenderness, nipple changes, or nipple discharge.  GI: Denies abdominal pain, constipation, diarrhea, nausea, or vomiting.  GU: Denies dysuria, hematuria, vaginal bleeding, vaginal discharge, or vaginal dryness.  Musculoskeletal: Recent complaints of right leg pain as above.  Otherwise, denies joint or bone pain.  Neuro: Denies recent fall or numbness / tingling in her extremities. Skin: Denies rash, pruritis, or open wounds.  Psych: Denies depression, anxiety, insomnia, or memory loss.   A 14-point review of systems was completed and was negative, except as noted above.  ONCOLOGY TREATMENT TEAM:  1. Surgeon:  Dr. Brantley Stage at [facility]  2. Medical Oncologist: Dr. Jana Hakim 3. Radiation Oncologist: Dr. Isidore Moos    PAST MEDICAL/SURGICAL HISTORY:  Past Medical History  Diagnosis Date  . Hypertension     . Hypothyroid   . GERD (gastroesophageal reflux disease)   . Syncope   . Ventricular bigeminy   . Breast cancer of lower-outer quadrant of right female breast ([place]) 05/07/2015  . Breast cancer ([place])   . Stroke [facility])     TIA, no deficits, placed on Plavix   Past Surgical History  Procedure Laterality Date  . Abdominal hysterectomy    . Ovarian cyst removed    . Incontinence surgery    . Bladder suspension    . Cataract extraction    . Partial mastectomy with needle localization Bilateral 05/29/2015    Procedure: LEFT BREAST PARTIAL MASTECTOMY WITH NEEDLE LOCALIZATION TIMES 2 AND RIGHT BREAST PARTIAL MASTECTOMY WITH NEEDLE LOCALIZATION;  Surgeon: Erroll Luna, MD;  Location: [facility];  Service: General;  Laterality: Bilateral;  . Re-excision of breast lumpectomy Left 06/21/2015    Procedure: LEFT BREAST RE EXCISION OF LUMPECTOMY;  Surgeon: Erroll Luna, MD;  Location: [facility];  Service: General;  Laterality: Left;     ALLERGIES:  Allergies  Allergen Reactions  . Macrodantin [Nitrofurantoin Macrocrystal] Nausea Only    "extreme nausea"  . Codeine Nausea And Vomiting  . Crestor [Rosuvastatin Calcium] Other (See Comments)    "aching in legs"  . Doxycycline Itching and Rash  . Hydrochlorothiazide Rash    "all over body rash"  . Norvasc [Amlodipine Besylate] Swelling    "swelling in ankles"  . Sulfa Antibiotics Nausea And Vomiting     CURRENT MEDICATIONS:  Current Outpatient Prescriptions on File Prior to Visit  Medication Sig Dispense Refill  . aspirin EC 81 MG tablet Take 81 mg by mouth once.     . Calcium Carbonate-Vitamin D (CALCIUM 600 + D PO) Take 1 tablet by mouth every evening.     . clobetasol (TEMOVATE) 0.05 % external solution APPLY TO SCALP EVERY DAY  2  . cloNIDine (CATAPRES) 0.2 MG tablet Take 0.2 mg by mouth at bedtime.  11  . clopidogrel (PLAVIX) 75 MG tablet Take 75 mg by mouth daily.    . Cranberry 500 MG CAPS Take 500 mg  by mouth daily.    . furosemide (LASIX) 20 MG tablet Take 20 mg by mouth daily.     Marland Kitchen levothyroxine (SYNTHROID, LEVOTHROID) 88 MCG tablet Take 88 mcg by mouth daily.    Marland Kitchen losartan (COZAAR) 100 MG tablet Take 100 mg by mouth daily.    . metoprolol succinate (TOPROL-XL) 50 MG 24 hr tablet Take 50 mg by mouth daily. Take with or immediately following a meal.    . pantoprazole (PROTONIX) 40 MG tablet Take 40 mg by mouth every evening.    . sennosides-docusate sodium (SENOKOT-S) 8.6-50 MG tablet Take 1 tablet by mouth daily as needed for constipation.      No current facility-administered medications on file prior to visit.     ONCOLOGIC FAMILY HISTORY:  Family History  Problem Relation Age of Onset  . Coronary artery disease Mother     MI at age 77  . Breast cancer Sister 78  . Kidney disease Brother   . Cancer Sister     colon cancer suggested    GENETIC COUNSELING/TESTING: No   SOCIAL HISTORY:  Courtney Shea  is married and lives with her spouse in [place], [place].  She has 2 children. 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  She denies any current or history of tobacco, alcohol, or illicit drug use.     PHYSICAL EXAMINATION:  Vital Signs: Filed Vitals:   10/25/15 1109  BP: 168/84  Pulse: 70  Temp: 97.6 F (36.4 C)  Resp: 19   ECOG Performance Status: 0  General: Well-nourished, well-appearing female in no acute distress.  She is unaccompanied in clinic today.   HEENT: Head is atraumatic and normocephalic.  Pupils equal and reactive to light and accomodation. Conjunctivae clear without exudate.  Sclerae anicteric. Oral mucosa is pink, moist, and intact without lesions.  Oropharynx is pink without lesions or erythema.  Lymph: No cervical, supraclavicular, infraclavicular, or axillary lymphadenopathy noted on palpation.  Cardiovascular: Regular rate and rhythm without murmurs, rubs, or gallops. Respiratory: Clear to auscultation  bilaterally. Chest expansion symmetric without accessory muscle use on inspiration or expiration.  GI: Abdomen soft and round. No tenderness to palpation. Bowel sounds normoactive in 4 quadrants. GU: Deferred.  Musculoskeletal: Muscle strength 5/5 in all extremities.   Neuro: No focal deficits. Steady gait.  Psych: Mood and affect normal and appropriate for situation.  Extremities: No edema, cyanosis, or clubbing.  Skin: Warm and dry. No open lesions noted.   LABORATORY DATA:  None for this visit.  DIAGNOSTIC IMAGING:  None for this visit.     ASSESSMENT AND PLAN:   1. Breast cancer: Stage IIA invasive lobular carcinoma of the left breast, grade 1, ER positive, PR positive, HER2/neu negative, S/P bilateral lumpectomies with right showing atypical lobular hyperplasia without evidence of malignancy, S/P adjuvant radiation to the left breast to begin adjuvant endocrine therapy with anastrozole tomorrow.  Courtney Shea is doing well without clinical symptoms worrisome for disease recurrence. She will follow-up with Gentry Fitz NP in January 2017 with history and physical examination per surveillance protocol.  She has not yet begun her anti-estrogen therapy with anastrozole as she did not have a prescription.  I will send her prescription in following today's visit and she will begin it tomorrow morning. She was instructed to make Gentry Fitz NP / Dr. Jana Hakim or myself aware if she begins to experience any side effects of the medication and I could see her back in clinic to help manage those side effects, as needed. A comprehensive survivorship care plan and treatment summary was reviewed with the patient today detailing her breast cancer diagnosis, treatment course, potential late/long-term effects of treatment, appropriate follow-up care with recommendations for the future, and patient education resources.  A copy of this summary, along with a letter will be sent to the patient's primary care  provider via in basket message after today's visit.  Courtney Shea is welcome to return to the Survivorship Clinic in the future, as needed; no follow-up will be scheduled at this time.    2. Bone health:  Given Courtney Shea's age/history of breast cancer and her current treatment regimen including endocrine therapy with anastrozole, she is at risk for bone demineralization.  Per our records, her last DEXA scan was performed in November 2016 revealing osteopenia. We will continue to monitor this closely while she is on endocrine therapy.  In the meantime, she was encouraged to increase her consumption of foods rich in calcium and vitamin D as well as to increase her weight-bearing activities.  She was given education on specific activities to promote  bone health.  3. Cancer screening:  Due to Courtney Shea's history and her age, she should receive screening for skin cancers.  The information and recommendations are listed on the patient's comprehensive care plan/treatment summary and were reviewed in detail with the patient.    4. Health maintenance and wellness promotion: Courtney Shea was encouraged to consume 5-7 servings of fruits and vegetables per day. We reviewed the "Nutrition Rainbow" handout, as well as discussed recommendations to maximize nutrition and minimize recurrence, such as increased intake of fruits, vegetables, lean proteins, and minimizing the intake of red meats and processed foods.  She was also encouraged to engage in moderate to vigorous exercise for 30 minutes per day most days of the week. We discussed the [employer] fitness program, which is designed for cancer survivors to help them become more physically fit after cancer treatments. She reports that she is to begin a new exercise program next week now that the pain in her left leg has improved.  She was instructed to limit her alcohol consumption and continue to abstain from tobacco use.  A copy of the "Take Control of Your  Health" brochure was given to her reinforcing these recommendations.   6. Support services/counseling: It is not uncommon for this period of the patient's cancer care trajectory to be one of many emotions and stressors.  We discussed an opportunity for her to participate in the next session of [facility] ("Finding Your New Normal") support group series designed for patients after they have completed treatment.  Courtney Shea was encouraged to take advantage of our many other support services programs, support groups, and/or counseling in coping with her new life as a cancer survivor after completing anti-cancer treatment.  She was offered support today through active listening and expressive supportive counseling.  She was given information regarding our available services and encouraged to contact me with any questions or for help enrolling in any of our support group/programs.    A total of 50 minutes of face-to-face time was spent with this patient with greater than 50% of that time in counseling and care-coordination.   Sylvan Cheese, NP  Survivorship Program [facility] [phone]   Note: PRIMARY CARE PROVIDER Henrine Screws, [place] [phone]

## 2015-10-29 ENCOUNTER — Other Ambulatory Visit: Payer: Self-pay | Admitting: Oncology

## 2015-10-30 ENCOUNTER — Other Ambulatory Visit: Payer: Self-pay | Admitting: Oncology

## 2015-10-31 ENCOUNTER — Telehealth: Payer: Self-pay | Admitting: *Deleted

## 2015-10-31 DIAGNOSIS — M545 Low back pain: Secondary | ICD-10-CM | POA: Diagnosis not present

## 2015-10-31 DIAGNOSIS — M79604 Pain in right leg: Secondary | ICD-10-CM | POA: Diagnosis not present

## 2015-10-31 DIAGNOSIS — R262 Difficulty in walking, not elsewhere classified: Secondary | ICD-10-CM | POA: Diagnosis not present

## 2015-10-31 DIAGNOSIS — M6281 Muscle weakness (generalized): Secondary | ICD-10-CM | POA: Diagnosis not present

## 2015-10-31 NOTE — Telephone Encounter (Signed)
Pt states she has started the Anastrozole " last week when I saw Heather ". " I started that day and have taken it everyday "  Pt has no concerns and understands to call if questions arise.

## 2015-11-06 ENCOUNTER — Other Ambulatory Visit: Payer: Self-pay | Admitting: *Deleted

## 2015-11-06 DIAGNOSIS — C50212 Malignant neoplasm of upper-inner quadrant of left female breast: Secondary | ICD-10-CM

## 2015-11-07 ENCOUNTER — Other Ambulatory Visit (HOSPITAL_BASED_OUTPATIENT_CLINIC_OR_DEPARTMENT_OTHER): Payer: Medicare Other

## 2015-11-07 ENCOUNTER — Encounter: Payer: Self-pay | Admitting: Nurse Practitioner

## 2015-11-07 ENCOUNTER — Ambulatory Visit (HOSPITAL_BASED_OUTPATIENT_CLINIC_OR_DEPARTMENT_OTHER): Payer: Medicare Other | Admitting: Nurse Practitioner

## 2015-11-07 VITALS — BP 140/63 | HR 43 | Temp 98.2°F | Resp 17 | Ht 64.0 in | Wt 167.9 lb

## 2015-11-07 DIAGNOSIS — C50212 Malignant neoplasm of upper-inner quadrant of left female breast: Secondary | ICD-10-CM

## 2015-11-07 DIAGNOSIS — M8580 Other specified disorders of bone density and structure, unspecified site: Secondary | ICD-10-CM

## 2015-11-07 LAB — CBC WITH DIFFERENTIAL/PLATELET
BASO%: 0.8 % (ref 0.0–2.0)
BASOS ABS: 0.1 10*3/uL (ref 0.0–0.1)
EOS%: 0.9 % (ref 0.0–7.0)
Eosinophils Absolute: 0.1 10*3/uL (ref 0.0–0.5)
HEMATOCRIT: 40.5 % (ref 34.8–46.6)
HGB: 13.4 g/dL (ref 11.6–15.9)
LYMPH#: 1.3 10*3/uL (ref 0.9–3.3)
LYMPH%: 15.3 % (ref 14.0–49.7)
MCH: 30.4 pg (ref 25.1–34.0)
MCHC: 33.1 g/dL (ref 31.5–36.0)
MCV: 91.7 fL (ref 79.5–101.0)
MONO#: 0.8 10*3/uL (ref 0.1–0.9)
MONO%: 9.5 % (ref 0.0–14.0)
NEUT#: 6.2 10*3/uL (ref 1.5–6.5)
NEUT%: 73.5 % (ref 38.4–76.8)
PLATELETS: 213 10*3/uL (ref 145–400)
RBC: 4.42 10*6/uL (ref 3.70–5.45)
RDW: 13.7 % (ref 11.2–14.5)
WBC: 8.5 10*3/uL (ref 3.9–10.3)

## 2015-11-07 LAB — COMPREHENSIVE METABOLIC PANEL
ALT: 31 U/L (ref 0–55)
ANION GAP: 10 meq/L (ref 3–11)
AST: 22 U/L (ref 5–34)
Albumin: 4.1 g/dL (ref 3.5–5.0)
Alkaline Phosphatase: 90 U/L (ref 40–150)
BUN: 25.2 mg/dL (ref 7.0–26.0)
CALCIUM: 9.5 mg/dL (ref 8.4–10.4)
CHLORIDE: 105 meq/L (ref 98–109)
CO2: 26 mEq/L (ref 22–29)
Creatinine: 0.9 mg/dL (ref 0.6–1.1)
EGFR: 63 mL/min/{1.73_m2} — ABNORMAL LOW (ref >90)
Glucose: 112 mg/dl (ref 70–140)
POTASSIUM: 3.7 meq/L (ref 3.5–5.1)
Sodium: 141 mEq/L (ref 136–145)
Total Bilirubin: 0.45 mg/dL (ref 0.20–1.20)
Total Protein: 7.7 g/dL (ref 6.4–8.3)

## 2015-11-07 NOTE — Progress Notes (Signed)
[name]  [phone] [phone]     ID: Courtney Shea DOB: 1930/04/14  MR#: [id number]  WEX#:[id number]  Patient Care Team: Courtney Huddle, MD as PCP - General (Internal Medicine) Courtney Cruel, MD as Consulting Physician (Oncology) Courtney Gibson, MD as Attending Physician (Radiation Oncology) Courtney Luna, MD as Consulting Physician (General Surgery) Courtney Kaufmann, RN as Registered Nurse Courtney Germany, RN as Registered Nurse Courtney Bouche, NP as Nurse Practitioner (Nurse Practitioner) Courtney Cheese, NP as Nurse Practitioner (Hematology and Oncology) PCP: Courtney Screws, MD GYN: Courtney Horn MD OTHER MD: Courtney Ruths MD, Courtney Bonito MD  CHIEF COMPLAINT: Estrogen receptor positive breast cancer  CURRENT TREATMENT: anastrozole  BREAST CANCER HISTORY: The patient has had multiple diagnostic mammographies and ultrasonography his dating back to September 2013 following 2 areas in the left breast, one of the 2:00 position, the other at the 3:00 position. These were felt to be most likely fibroadenomatous. Repeat left ultrasonography 02/04/2013 showed them to be stable. On 04/11/2014 bilateral diagnostic mammography with ultrasonography found nothing on mammography, and by ultrasound the 2 masses previously described were again seen and were again unchanged.  On 04/16/2015, with bilateral diagnostic mammography with tomosynthesis and bilateral breast ultrasonography at the breast Center, the patient's breast density was read as category D. In the right breast there was an area of distortion in the lower outer quadrant. This was not palpable. By ultrasound there was a vague area of distortion measuring 0.9 cm. The right axilla was negative.  In the left breast upper inner quadrant there was an area of possible distortion with no discrete mass. The previously noted fibroadenomas were again seen.  On 04/27/2015 the patient underwent  stereotactic biopsy of the right lower outer quadrant area of distortion. This showed ([id number]) atypical lobular hyperplasia including a radial scar. On 05/03/2015 the patient underwent biopsy of two adjacent areas of architectural distortion in the upper inner left breast. This showed ([id number]) invasive lobular breast cancer in both biopsies, which were morphologically identical. Estrogen receptor was 95% positive. Progesterone receptor was 1% "positive". The proliferation marker was 5%. There was no HER-2 amplification, with a signals ratio of 1.52, the number per cell being 2.05.  On 05/08/2015 the patient underwent bilateral breast MRIs. In the right breast only post biopsy changes were seen. In the left breast there was a 3.27 m area of ill-defined enhancement and a second area measuring 1.3 cm, the whole measuring up to 4.0 cm at least when added together. There was a level I left axillary lymph node with minimal cortical thickening of uncertain significance.  The patient's subsequent history is as detailed below  INTERVAL HISTORY: Courtney Shea returns for follow up of her breast cancer, alone. Since her last visit she has completed radiation therapy and is doing well. She had not started the anastrozole until after she was reprompted by our Survivorship Nurse Practitioner on 10/25/15. So far she has no complaint with this drug. She may have noticed a few warm spells, but no true hot flashes. She denies vaginal changes. She is currently in physical therapy for right knee pain, but his predates the start of anastrozole. She has not had any increased myalgias or arthralgias since that point.   REVIEW OF SYSTEMS: Courtney Shea denies fevers, chills, nausea, or vomiting. She uses prune juice and colace for constipation. She sometimes has diarrhea with her diverticulosis. She eating and drinking well. She denies shortness of breath, chest pain, cough,  or palpitations. She has no unusual headaches, dizziness,  vision changes, or weakness. A detailed review of systems is otherwise stable.   PAST MEDICAL HISTORY: Past Medical History  Diagnosis Date  . Hypertension   . Hypothyroid   . GERD (gastroesophageal reflux disease)   . Syncope   . Ventricular bigeminy   . Breast cancer of lower-outer quadrant of right female breast ([place]) 05/07/2015  . Breast cancer ([place])   . Stroke ([place])     TIA, no deficits, placed on Plavix    PAST SURGICAL HISTORY: Past Surgical History  Procedure Laterality Date  . Abdominal hysterectomy    . Ovarian cyst removed    . Incontinence surgery    . Bladder suspension    . Cataract extraction    . Partial mastectomy with needle localization Bilateral 05/29/2015    Procedure: LEFT BREAST PARTIAL MASTECTOMY WITH NEEDLE LOCALIZATION TIMES 2 AND RIGHT BREAST PARTIAL MASTECTOMY WITH NEEDLE LOCALIZATION;  Surgeon: Courtney Luna, MD;  Location: [facility];  Service: General;  Laterality: Bilateral;  . Re-excision of breast lumpectomy Left 06/21/2015    Procedure: LEFT BREAST RE EXCISION OF LUMPECTOMY;  Surgeon: Courtney Luna, MD;  Location: [facility];  Service: General;  Laterality: Left;    FAMILY HISTORY Family History  Problem Relation Age of Onset  . Coronary artery disease Mother     MI at age 17  . Breast cancer Sister 30  . Kidney disease Brother   . Cancer Sister     colon cancer suggested   the patient's father died at age 31, the patient's mother at age 80, both from strokes. The patient had one brother, 2 sisters. One sister was diagnosed with breast cancer at age 78. There is no history of ovarian cancer in the family.  GYNECOLOGIC HISTORY:  No LMP recorded. Patient is postmenopausal. Menarche age 66, first live birth age 21. The patient is GX P2. She underwent hysterectomy without salpingo-oophorectomy remotely, but then had bilateral salpingo-oophorectomy subsequently for removal ovarian cysts. She took hormone replacement  for about 20 years, stopping in 2014.  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 Her husband Courtney Shea is a former Chief Financial Officer. Daughter Courtney Shea numb works for the [employer], orienting at risk students. Son Rodman Key lives in [place] where he does research and development for [employer]. The patient has 3 grandchildren. She attends the [employer] (her son-in-law is Theme park manager.    ADVANCED DIRECTIVES: In place   HEALTH MAINTENANCE: Social History  Substance Use Topics  . Smoking status: Never Smoker   . Smokeless tobacco: Never Used  . Alcohol Use: No     Colonoscopy: 2011/[facility]  PAP: 2016  Bone density: 2001/normal  Lipid panel:  Allergies  Allergen Reactions  . Macrodantin [Nitrofurantoin Macrocrystal] Nausea Only    "extreme nausea"  . Codeine Nausea And Vomiting  . Crestor [Rosuvastatin Calcium] Other (See Comments)    "aching in legs"  . Doxycycline Itching and Rash  . Hydrochlorothiazide Rash    "all over body rash"  . Norvasc [Amlodipine Besylate] Swelling    "swelling in ankles"  . Sulfa Antibiotics Nausea And Vomiting    Current Outpatient Prescriptions  Medication Sig Dispense Refill  . anastrozole (ARIMIDEX) 1 MG tablet Take 1 tablet (1 mg total) by mouth daily. 30 tablet 6  . aspirin EC 81 MG tablet Take 81 mg by mouth once.     . Calcium  Carbonate-Vitamin D (CALCIUM 600 + D PO) Take 1 tablet by mouth every evening.     . clobetasol (TEMOVATE) 0.05 % external solution APPLY TO SCALP EVERY DAY  2  . cloNIDine (CATAPRES) 0.2 MG tablet Take 0.2 mg by mouth at bedtime.  11  . clopidogrel (PLAVIX) 75 MG tablet Take 75 mg by mouth daily.    . Cranberry 500 MG CAPS Take 500 mg by mouth daily.    . furosemide (LASIX) 20 MG tablet Take 20 mg by mouth daily.     Marland Kitchen levothyroxine (SYNTHROID, LEVOTHROID) 88 MCG tablet Take 88 mcg by mouth daily.    Marland Kitchen losartan (COZAAR) 100 MG tablet Take 100 mg by mouth daily.      . metoprolol succinate (TOPROL-XL) 50 MG 24 hr tablet Take 50 mg by mouth daily. Take with or immediately following a meal.    . pantoprazole (PROTONIX) 40 MG tablet Take 40 mg by mouth every evening.    . sennosides-docusate sodium (SENOKOT-S) 8.6-50 MG tablet Take 1 tablet by mouth daily as needed for constipation.      No current facility-administered medications for this visit.    OBJECTIVE: Older white woman in no acute distress Filed Vitals:   11/07/15 1042  BP: 140/63  Pulse: 43  Temp: 98.2 F (36.8 C)  Resp: 17     Body mass index is 28.81 kg/(m^2).    ECOG FS:0 - Asymptomatic  Skin: warm, dry  HEENT: sclerae anicteric, conjunctivae pink, oropharynx clear. No thrush or mucositis.  Lymph Nodes: No cervical or supraclavicular lymphadenopathy  Lungs: clear to auscultation bilaterally, no rales, wheezes, or rhonci  Heart: regular rate and rhythm  Abdomen: round, soft, non tender, positive bowel sounds  Musculoskeletal: No focal spinal tenderness, no peripheral edema  Neuro: non focal, well oriented, positive affect  Breasts: deferred  LAB RESULTS:  CMP     Component Value Date/Time   NA 141 11/07/2015 1028   NA 139 06/19/2015 1144   K 3.7 11/07/2015 1028   K 4.1 06/19/2015 1144   CL 104 06/19/2015 1144   CO2 26 11/07/2015 1028   CO2 27 06/19/2015 1144   GLUCOSE 112 11/07/2015 1028   GLUCOSE 112* 06/19/2015 1144   BUN 25.2 11/07/2015 1028   BUN 20 06/19/2015 1144   CREATININE 0.9 11/07/2015 1028   CREATININE 0.82 06/19/2015 1144   CALCIUM 9.5 11/07/2015 1028   CALCIUM 9.6 06/19/2015 1144   PROT 7.7 11/07/2015 1028   PROT 7.3 06/19/2015 1144   ALBUMIN 4.1 11/07/2015 1028   ALBUMIN 4.0 06/19/2015 1144   AST 22 11/07/2015 1028   AST 35 06/19/2015 1144   ALT 31 11/07/2015 1028   ALT 42 06/19/2015 1144   ALKPHOS 90 11/07/2015 1028   ALKPHOS 78 06/19/2015 1144   BILITOT 0.45 11/07/2015 1028   BILITOT 0.6 06/19/2015 1144   GFRNONAA >60 06/19/2015 1144   GFRAA  >60 06/19/2015 1144    INo results found for: SPEP, UPEP  Lab Results  Component Value Date   WBC 8.5 11/07/2015   NEUTROABS 6.2 11/07/2015   HGB 13.4 11/07/2015   HCT 40.5 11/07/2015   MCV 91.7 11/07/2015   PLT 213 11/07/2015      Chemistry      Component Value Date/Time   NA 141 11/07/2015 1028   NA 139 06/19/2015 1144   K 3.7 11/07/2015 1028   K 4.1 06/19/2015 1144   CL 104 06/19/2015 1144   CO2 26 11/07/2015 1028  CO2 27 06/19/2015 1144   BUN 25.2 11/07/2015 1028   BUN 20 06/19/2015 1144   CREATININE 0.9 11/07/2015 1028   CREATININE 0.82 06/19/2015 1144      Component Value Date/Time   CALCIUM 9.5 11/07/2015 1028   CALCIUM 9.6 06/19/2015 1144   ALKPHOS 90 11/07/2015 1028   ALKPHOS 78 06/19/2015 1144   AST 22 11/07/2015 1028   AST 35 06/19/2015 1144   ALT 31 11/07/2015 1028   ALT 42 06/19/2015 1144   BILITOT 0.45 11/07/2015 1028   BILITOT 0.6 06/19/2015 1144       No results found for: LABCA2  No components found for: LABCA125  No results for input(s): INR in the last 168 hours.  Urinalysis    Component Value Date/Time   COLORURINE YELLOW 06/20/2013 0910   APPEARANCEUR CLEAR 06/20/2013 0910   LABSPEC 1.004* 06/20/2013 0910   PHURINE 6.5 06/20/2013 0910   GLUCOSEU NEGATIVE 06/20/2013 0910   HGBUR NEGATIVE 06/20/2013 0910   BILIRUBINUR NEGATIVE 06/20/2013 0910   KETONESUR NEGATIVE 06/20/2013 0910   PROTEINUR NEGATIVE 06/20/2013 0910   UROBILINOGEN 0.2 06/20/2013 0910   NITRITE NEGATIVE 06/20/2013 0910   LEUKOCYTESUR TRACE* 06/20/2013 0910    STUDIES: No results found.  ASSESSMENT: 82 y.o. Courtney Shea woman status post left breast biopsy 05/03/2015 of 2 separate masses, clinically T2 N0, stage IIA invasive lobular breast cancer, grade 2, estrogen receptor positive, progesterone receptor 1% "positive", HER-2 not amplified  (a) right breast biopsy 04/27/2015 shows atypical lobular hyperplasia/complex sclerotic lesion  (1) status post left  lumpectomy on 05/29/15 for pT3, Nx, ER/PR positive, HER-2 negative, with close margins  (a) reexcision of left lumpectomy on 06/21/15 demonstrated several areas of fatty necrosis and a atypical lobular hyperplasia  (2) radiation to begin 07/31/15  (3) anastrozole started 10/25/15  (4) osteopenia - t-score 1.5 on 09/04/15  PLAN: Courtney Shea is doing well so far on the anastrozole, but she has only been on the drug for 2 weeks and symptoms can take as long as 3 months to surface. We reviewed the results of her bone density scan, which showed that she is osteopenic. She is on caltrate daily, but this only has a small amount of vitamin D in it. She was on a separate vitamin D supplement, but this make her joint ache, so her PCP advised that she could discontinue it. We will recheck a bone density scan in 2 years, and if there is significant deterioration she may be willing to try a supplement again.   Courtney Shea will come back in May or June, and by then we will know if she is truly tolerating the anastrozole. Her next mammogram will be due in July. She understands and agrees with this plan. She knows the goal of treatment in her case is cure. She has been encouraged to call with any issues that might arise before her next visit here.   Laurie Panda, NP   11/07/2015 11:15 AM

## 2015-11-09 ENCOUNTER — Telehealth: Payer: Self-pay | Admitting: Oncology

## 2015-11-09 DIAGNOSIS — M6281 Muscle weakness (generalized): Secondary | ICD-10-CM | POA: Diagnosis not present

## 2015-11-09 DIAGNOSIS — M545 Low back pain: Secondary | ICD-10-CM | POA: Diagnosis not present

## 2015-11-09 DIAGNOSIS — R262 Difficulty in walking, not elsewhere classified: Secondary | ICD-10-CM | POA: Diagnosis not present

## 2015-11-09 DIAGNOSIS — M79604 Pain in right leg: Secondary | ICD-10-CM | POA: Diagnosis not present

## 2015-11-09 NOTE — Telephone Encounter (Signed)
Called patient and she is aware of her appointment °

## 2016-03-05 ENCOUNTER — Other Ambulatory Visit: Payer: Self-pay | Admitting: *Deleted

## 2016-03-05 DIAGNOSIS — C50212 Malignant neoplasm of upper-inner quadrant of left female breast: Secondary | ICD-10-CM

## 2016-03-06 ENCOUNTER — Ambulatory Visit (HOSPITAL_BASED_OUTPATIENT_CLINIC_OR_DEPARTMENT_OTHER): Payer: Medicare Other | Admitting: Oncology

## 2016-03-06 ENCOUNTER — Other Ambulatory Visit (HOSPITAL_BASED_OUTPATIENT_CLINIC_OR_DEPARTMENT_OTHER): Payer: Medicare Other

## 2016-03-06 ENCOUNTER — Telehealth: Payer: Self-pay | Admitting: Oncology

## 2016-03-06 VITALS — BP 164/86 | HR 72 | Temp 98.4°F | Resp 18 | Ht 64.0 in | Wt 172.0 lb

## 2016-03-06 DIAGNOSIS — C50212 Malignant neoplasm of upper-inner quadrant of left female breast: Secondary | ICD-10-CM | POA: Diagnosis not present

## 2016-03-06 DIAGNOSIS — Z170 Estrogen receptor positive status [ER+]: Secondary | ICD-10-CM | POA: Diagnosis not present

## 2016-03-06 DIAGNOSIS — C50511 Malignant neoplasm of lower-outer quadrant of right female breast: Secondary | ICD-10-CM

## 2016-03-06 DIAGNOSIS — M8580 Other specified disorders of bone density and structure, unspecified site: Secondary | ICD-10-CM

## 2016-03-06 LAB — CBC WITH DIFFERENTIAL/PLATELET
BASO%: 0.2 % (ref 0.0–2.0)
Basophils Absolute: 0 10*3/uL (ref 0.0–0.1)
EOS%: 0.9 % (ref 0.0–7.0)
Eosinophils Absolute: 0.1 10*3/uL (ref 0.0–0.5)
HCT: 37.8 % (ref 34.8–46.6)
HGB: 12.7 g/dL (ref 11.6–15.9)
LYMPH%: 25.1 % (ref 14.0–49.7)
MCH: 30.1 pg (ref 25.1–34.0)
MCHC: 33.6 g/dL (ref 31.5–36.0)
MCV: 89.6 fL (ref 79.5–101.0)
MONO#: 0.6 10*3/uL (ref 0.1–0.9)
MONO%: 7.6 % (ref 0.0–14.0)
NEUT%: 66.2 % (ref 38.4–76.8)
NEUTROS ABS: 5.4 10*3/uL (ref 1.5–6.5)
PLATELETS: 219 10*3/uL (ref 145–400)
RBC: 4.22 10*6/uL (ref 3.70–5.45)
RDW: 13 % (ref 11.2–14.5)
WBC: 8.1 10*3/uL (ref 3.9–10.3)
lymph#: 2 10*3/uL (ref 0.9–3.3)

## 2016-03-06 LAB — COMPREHENSIVE METABOLIC PANEL
ALT: 34 U/L (ref 0–55)
AST: 25 U/L (ref 5–34)
Albumin: 4.1 g/dL (ref 3.5–5.0)
Alkaline Phosphatase: 81 U/L (ref 40–150)
Anion Gap: 8 mEq/L (ref 3–11)
BUN: 22.3 mg/dL (ref 7.0–26.0)
CO2: 27 meq/L (ref 22–29)
CREATININE: 0.9 mg/dL (ref 0.6–1.1)
Calcium: 9.9 mg/dL (ref 8.4–10.4)
Chloride: 105 mEq/L (ref 98–109)
EGFR: 58 mL/min/{1.73_m2} — ABNORMAL LOW (ref >90)
GLUCOSE: 98 mg/dL (ref 70–140)
Potassium: 3.9 mEq/L (ref 3.5–5.1)
SODIUM: 140 meq/L (ref 136–145)
TOTAL PROTEIN: 7.1 g/dL (ref 6.4–8.3)
Total Bilirubin: 0.45 mg/dL (ref 0.20–1.20)

## 2016-03-06 MED ORDER — ANASTROZOLE 1 MG PO TABS: 1.0000 mg | ORAL_TABLET | Freq: Every day | ORAL | Status: DC

## 2016-03-06 NOTE — Telephone Encounter (Signed)
Unable to sch Sept 2018 appt with labs at this time. Pt will call to schedule appt

## 2016-03-06 NOTE — Progress Notes (Signed)
[facility]  [phone] [phone]     ID: Courtney Shea DOB: 01/07/1930  MR#: [id number]  CSN#:[id number]  Patient Care Team: Robert Gates, MD as PCP - General (Internal Medicine) Gustav C Magrinat, MD as Consulting Physician (Oncology) Sarah Squire, MD as Attending Physician (Radiation Oncology) Thomas Cornett, MD as Consulting Physician (General Surgery) Dawn C Stuart, RN as Registered Nurse Keisha N Martini, RN as Registered Nurse Gretchen W Dawson, NP as Nurse Practitioner (Nurse Practitioner) Heather Thompson Mackey, NP as Nurse Practitioner (Hematology and Oncology) PCP: GATES,ROBERT NEVILL, MD GYN: Ron Neal MD OTHER MD: Brian Crenshaw MD, Dan Jones MD  CHIEF COMPLAINT: Estrogen receptor positive breast cancer  CURRENT TREATMENT: anastrozole  BREAST CANCER HISTORY: The patient has had multiple diagnostic mammographies and ultrasonography his dating back to September 2013 following 2 areas in the left breast, one of the 2:00 position, the other at the 3:00 position. These were felt to be most likely fibroadenomatous. Repeat left ultrasonography 02/04/2013 showed them to be stable. On 04/11/2014 bilateral diagnostic mammography with ultrasonography found nothing on mammography, and by ultrasound the 2 masses previously described were again seen and were again unchanged.  On 04/16/2015, with bilateral diagnostic mammography with tomosynthesis and bilateral breast ultrasonography at the breast Center, the patient's breast density was read as category D. In the right breast there was an area of distortion in the lower outer quadrant. This was not palpable. By ultrasound there was a vague area of distortion measuring 0.9 cm. The right axilla was negative.  In the left breast upper inner quadrant there was an area of possible distortion with no discrete mass. The previously noted fibroadenomas were again seen.  On 04/27/2015 the patient underwent  stereotactic biopsy of the right lower outer quadrant area of distortion. This showed ([id number]) atypical lobular hyperplasia including a radial scar. On 05/03/2015 the patient underwent biopsy of two adjacent areas of architectural distortion in the upper inner left breast. This showed ([id number]) invasive lobular breast cancer in both biopsies, which were morphologically identical. Estrogen receptor was 95% positive. Progesterone receptor was 1% "positive". The proliferation marker was 5%. There was no HER-2 amplification, with a signals ratio of 1.52, the number per cell being 2.05.  On 05/08/2015 the patient underwent bilateral breast MRIs. In the right breast only post biopsy changes were seen. In the left breast there was a 3.27 m area of ill-defined enhancement and a second area measuring 1.3 cm, the whole measuring up to 4.0 cm at least when added together. There was a level I left axillary lymph node with minimal cortical thickening of uncertain significance.  The patient's subsequent history is as detailed below  INTERVAL HISTORY: Courtney Shea returns for follow up of her Estrogen receptor positivebreast cancer.she started anastrozole November 2016.She is tolerating it quite well.Hot flashes and night sweats are not a problem. She does have vaginal dryness but it is no different than before, she says. She has not developed the arthralgias or myalgias that some patients can experience on this drug.She obtains it at less than $10 a month   REVIEW OF SYSTEMS: Courtney Shea  Does feel moderately fatigued and has not quite recoveredher energy from radiation. She is not able to exercise vigorouslybecause of balance problems She does hurt in her back, legs and feet. This is not more intense or persistent than before and it is intermittent. She gets cramps at in the evening. Occasionally her ankles swell. Just psoriasis, and easy bruising.  Aside from   these issues a detailed review of systems today was  noncontributory  PAST MEDICAL HISTORY: Past Medical History  Diagnosis Date  . Hypertension   . Hypothyroid   . GERD (gastroesophageal reflux disease)   . Syncope   . Ventricular bigeminy   . Breast cancer of lower-outer quadrant of right female breast ([place]) 05/07/2015  . Breast cancer ([place])   . Stroke ([place])     TIA, no deficits, placed on Plavix    PAST SURGICAL HISTORY: Past Surgical History  Procedure Laterality Date  . Abdominal hysterectomy    . Ovarian cyst removed    . Incontinence surgery    . Bladder suspension    . Cataract extraction    . Partial mastectomy with needle localization Bilateral 05/29/2015    Procedure: LEFT BREAST PARTIAL MASTECTOMY WITH NEEDLE LOCALIZATION TIMES 2 AND RIGHT BREAST PARTIAL MASTECTOMY WITH NEEDLE LOCALIZATION;  Surgeon: Erroll Luna, MD;  Location: [facility];  Service: General;  Laterality: Bilateral;  . Re-excision of breast lumpectomy Left 06/21/2015    Procedure: LEFT BREAST RE EXCISION OF LUMPECTOMY;  Surgeon: Erroll Luna, MD;  Location: [facility];  Service: General;  Laterality: Left;    FAMILY HISTORY Family History  Problem Relation Age of Onset  . Coronary artery disease Mother     MI at age 1  . Breast cancer Sister 51  . Kidney disease Brother   . Cancer Sister     colon cancer suggested   the patient's father died at age 88, the patient's mother at age 60, both from strokes. The patient had one brother, 2 sisters. One sister was diagnosed with breast cancer at age 51. There is no history of ovarian cancer in the family.  GYNECOLOGIC HISTORY:  No LMP recorded. Patient is postmenopausal. Menarche age 80, first live birth age 70. The patient is GX P2. She underwent hysterectomy without salpingo-oophorectomy remotely, but then had bilateral salpingo-oophorectomy subsequently for removal ovarian cysts. She took hormone replacement for about 20 years, stopping in 2014.  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 Her husband [name] is a former Chief Financial Officer. Daughter [name] numb works for the [employer], orienting at risk students. Son Rodman Key lives in [place] where he does research and development for [employer]. The patient has 3 grandchildren. She attends the [employer] (her son-in-law is Theme park manager.    ADVANCED DIRECTIVES: In place   HEALTH MAINTENANCE: Social History  Substance Use Topics  . Smoking status: Never Smoker   . Smokeless tobacco: Never Used  . Alcohol Use: No     Colonoscopy: 2011/[facility] Johnson  PAP: 2016  Bone density: 2001/normal  Lipid panel:  Allergies  Allergen Reactions  . Macrodantin [Nitrofurantoin Macrocrystal] Nausea Only    "extreme nausea"  . Codeine Nausea And Vomiting  . Crestor [Rosuvastatin Calcium] Other (See Comments)    "aching in legs"  . Doxycycline Itching and Rash  . Hydrochlorothiazide Rash    "all over body rash"  . Norvasc [Amlodipine Besylate] Swelling    "swelling in ankles"  . Sulfa Antibiotics Nausea And Vomiting    Current Outpatient Prescriptions  Medication Sig Dispense Refill  . anastrozole (ARIMIDEX) 1 MG tablet Take 1 tablet (1 mg total) by mouth daily. 30 tablet 6  . aspirin EC 81 MG tablet Take 81 mg by mouth once.     . Calcium Carbonate-Vitamin D (CALCIUM 600 + D PO) Take 1 tablet by  mouth every evening.     . clobetasol (TEMOVATE) 0.05 % external solution APPLY TO SCALP EVERY DAY  2  . cloNIDine (CATAPRES) 0.2 MG tablet Take 0.2 mg by mouth at bedtime.  11  . clopidogrel (PLAVIX) 75 MG tablet Take 75 mg by mouth daily.    . Cranberry 500 MG CAPS Take 500 mg by mouth daily.    . furosemide (LASIX) 20 MG tablet Take 20 mg by mouth daily.     . levothyroxine (SYNTHROID, LEVOTHROID) 88 MCG tablet Take 88 mcg by mouth daily.    . losartan (COZAAR) 100 MG tablet Take 100 mg by mouth daily.    . metoprolol succinate (TOPROL-XL) 50 MG 24 hr tablet Take 50 mg  by mouth daily. Take with or immediately following a meal.    . pantoprazole (PROTONIX) 40 MG tablet Take 40 mg by mouth every evening.    . sennosides-docusate sodium (SENOKOT-S) 8.6-50 MG tablet Take 1 tablet by mouth daily as needed for constipation.      No current facility-administered medications for this visit.    OBJECTIVE: Older white woman who appears stated age Filed Vitals:   03/06/16 1522  BP: 164/86  Pulse: 72  Temp: 98.4 F (36.9 C)  Resp: 18     Body mass index is 29.51 kg/(m^2).    ECOG FS:1 - Symptomatic but completely ambulatory  Sclerae unicteric, pupils round and equal Oropharynx clear and moist-- no thrush or other lesions No cervical or supraclavicular adenopathy Lungs no rales or rhonchi Heart regular rate and rhythm Abd soft, nontender, positive bowel sounds MSK no focal spinal tenderness, no upper extremity lymphedema Neuro: nonfocal, well oriented, appropriate affect Breasts:  The right breast is unremarkable. The left breast is status post lumpectomy and radiation. It is approximately 10% smaller than the right breast. There is no evidence of disease recurrence. The left axilla is benign.   LAB RESULTS:  CMP     Component Value Date/Time   NA 140 03/06/2016 1507   NA 139 06/19/2015 1144   K 3.9 03/06/2016 1507   K 4.1 06/19/2015 1144   CL 104 06/19/2015 1144   CO2 27 03/06/2016 1507   CO2 27 06/19/2015 1144   GLUCOSE 98 03/06/2016 1507   GLUCOSE 112* 06/19/2015 1144   BUN 22.3 03/06/2016 1507   BUN 20 06/19/2015 1144   CREATININE 0.9 03/06/2016 1507   CREATININE 0.82 06/19/2015 1144   CALCIUM 9.9 03/06/2016 1507   CALCIUM 9.6 06/19/2015 1144   PROT 7.1 03/06/2016 1507   PROT 7.3 06/19/2015 1144   ALBUMIN 4.1 03/06/2016 1507   ALBUMIN 4.0 06/19/2015 1144   AST 25 03/06/2016 1507   AST 35 06/19/2015 1144   ALT 34 03/06/2016 1507   ALT 42 06/19/2015 1144   ALKPHOS 81 03/06/2016 1507   ALKPHOS 78 06/19/2015 1144   BILITOT 0.45  03/06/2016 1507   BILITOT 0.6 06/19/2015 1144   GFRNONAA >60 06/19/2015 1144   GFRAA >60 06/19/2015 1144    INo results found for: SPEP, UPEP  Lab Results  Component Value Date   WBC 8.1 03/06/2016   NEUTROABS 5.4 03/06/2016   HGB 12.7 03/06/2016   HCT 37.8 03/06/2016   MCV 89.6 03/06/2016   PLT 219 03/06/2016      Chemistry      Component Value Date/Time   NA 140 03/06/2016 1507   NA 139 06/19/2015 1144   K 3.9 03/06/2016 1507   K 4.1 06/19/2015 1144     CL 104 06/19/2015 1144   CO2 27 03/06/2016 1507   CO2 27 06/19/2015 1144   BUN 22.3 03/06/2016 1507   BUN 20 06/19/2015 1144   CREATININE 0.9 03/06/2016 1507   CREATININE 0.82 06/19/2015 1144      Component Value Date/Time   CALCIUM 9.9 03/06/2016 1507   CALCIUM 9.6 06/19/2015 1144   ALKPHOS 81 03/06/2016 1507   ALKPHOS 78 06/19/2015 1144   AST 25 03/06/2016 1507   AST 35 06/19/2015 1144   ALT 34 03/06/2016 1507   ALT 42 06/19/2015 1144   BILITOT 0.45 03/06/2016 1507   BILITOT 0.6 06/19/2015 1144       No results found for: LABCA2  No components found for: LABCA125  No results for input(s): INR in the last 168 hours.  Urinalysis    Component Value Date/Time   COLORURINE YELLOW 06/20/2013 0910   APPEARANCEUR CLEAR 06/20/2013 0910   LABSPEC 1.004* 06/20/2013 0910   PHURINE 6.5 06/20/2013 0910   GLUCOSEU NEGATIVE 06/20/2013 0910   HGBUR NEGATIVE 06/20/2013 0910   BILIRUBINUR NEGATIVE 06/20/2013 0910   KETONESUR NEGATIVE 06/20/2013 0910   PROTEINUR NEGATIVE 06/20/2013 0910   UROBILINOGEN 0.2 06/20/2013 0910   NITRITE NEGATIVE 06/20/2013 0910   LEUKOCYTESUR TRACE* 06/20/2013 0910    STUDIES: EXAM: DUAL X-RAY ABSORPTIOMETRY (DXA) FOR BONE MINERAL DENSITY  IMPRESSION: Referring Physician: GUSTAV C MAGRINAT  PATIENT: Name: Courtney Shea, Courtney Shea Patient ID: [id number] Birth Date: 03/21/1930 Height: 64.0 in. Sex: Female Measured: 09/04/2015 Weight: 169.0 lbs. Indications: Advanced Age, Bilateral  Ovariectomy (65.51), Breast Cancer History, Caucasian, Estrogen Deficient, Height Loss (781.91), Hypothyroid, Hysterectomy, Postmenopausal, Protonix, Synthroid Fractures: Treatments: Calcitonin, Vitamin D (E933.5)  ASSESSMENT: The BMD measured at Femur Neck Left is 0.823 g/cm2 with a Shea-score of -1.5. This patient is considered to be osteopenic according to World Health Organization (WHO) criteria. L-3, L-4 were excluded due to degenerative changes. There has been a statistically significant decrease in BMD of the left hip since prior exam dated 03/29/2001.  Site Region Measured Date Measured Age WHO YA BMD Significant CHANGE Classification Shea-score DualFemur Neck Left 09/04/2015 82.8 Osteopenia -1.5 0.823 g/cm2 *  AP Spine L1-L2 09/04/2015 82.8 Normal 0.4 1.224 g/cm2  or  ASSESSMENT: 82 y.o. Courtney Shea woman status post left breast biopsy 05/03/2015 of 2 separate masses, clinically T2 N0, stage IIA invasive lobular breast cancer, grade 2, estrogen receptor positive, progesterone receptor 1% "positive", HER-2 not amplified  (a) right breast biopsy 04/27/2015 shows atypical lobular hyperplasia/complex sclerotic lesion  (1) status post left lumpectomy on 05/29/2015 for a pT3, NX, stage II invasive lobular breast cancer, grade 2, estrogen receptor positive, HER-2 negative, with closebut negative margins  (a) reexcision of left lumpectomy on 06/21/2015 demonstrated several areas of fatty necrosis and atypical lobular hyperplasia  (2) radiation10/01/2015-09/03/2015: Left Breast. 50 Gy in 25 fractions.   (3) anastrozole started 10/25/15  (4) osteopenia: Shea-score 1.5 on 09/04/2015  PLAN: Courtney Shea  sees her primary care physician Dr. Gates in May and November and she will see Dr. Cornett also in that time.. Accordingly I am going to see her in September, beginning September 2018.  She is very pleased with this plan  I have refilled her anastrozole so she can get 3 months at a time.  She will  be due for  Repeat bone density November 2018.   As far as her nighttime cramps are concerned we discussed hydration issues and she is going to try half to one cup of tonic water in the evening.    She knows to call for any problems that may develop before her next visit here. MAGRINAT,GUSTAV C, MD   03/06/2016 3:41 PM  

## 2016-09-08 ENCOUNTER — Other Ambulatory Visit: Payer: Self-pay | Admitting: Obstetrics & Gynecology

## 2016-09-08 DIAGNOSIS — Z853 Personal history of malignant neoplasm of breast: Secondary | ICD-10-CM

## 2016-09-15 ENCOUNTER — Ambulatory Visit
Admission: RE | Admit: 2016-09-15 | Discharge: 2016-09-15 | Disposition: A | Discharge status: Home / Self Care | Payer: Medicare Other | Source: Ambulatory Visit | Attending: Obstetrics & Gynecology | Admitting: Obstetrics & Gynecology

## 2016-09-15 DIAGNOSIS — Z853 Personal history of malignant neoplasm of breast: Secondary | ICD-10-CM

## 2016-12-08 ENCOUNTER — Other Ambulatory Visit: Payer: Self-pay | Admitting: *Deleted

## 2016-12-08 DIAGNOSIS — C50511 Malignant neoplasm of lower-outer quadrant of right female breast: Secondary | ICD-10-CM

## 2016-12-08 DIAGNOSIS — Z170 Estrogen receptor positive status [ER+]: Principal | ICD-10-CM

## 2016-12-08 MED ORDER — ANASTROZOLE 1 MG PO TABS: 1.0000 mg | ORAL_TABLET | Freq: Every day | ORAL | 12 refills | Status: DC

## 2017-01-05 IMAGING — MG MM BREAST BX W LOC DEV EA AD LESION IMG BX SPEC STEREO GUIDE*L*
6 series · 8 of 14 positions shown · non-contrast
Comparison: Previous exams.

ADDENDUM:
Pathology revealed invasive mammary carcinoma and mammary carcinoma
in situ, likely lobular in the upper inner quadrant of the left
breast. There was a second lesion in the upper inner quadrant of the
left breast, 1 cm posterior, also showing invasive mammary carcinoma
and mammary carcinoma in situ, likely lobular. This was found to be
concordant by Dr. Jurley Sainea. Pathology was discussed with the
patient by telephone. She reported doing well after the biopsy with
marked tenderness in the left breast. Post biopsy instructions and
care were reviewed and her questions were answered. She has been
scheduled for [REDACTED] [REDACTED] on
May 09, 2015. A bilateral breast MRI has been scheduled for May 08, 2015. My number was provided for additional questions and
concerns.

Pathology results reported by Marie Brigitte Gerald RN, BSN on May 07, 2015.
CLINICAL DATA: Patient presents for stereotactic core needle biopsy
of 2 adjacent areas of architectural distortion in the medial left
breast, slightly above midline. Patient underwent a recent biopsy of
a similar area of distortion in the right breast, which revealed a
complex sclerosing lesion and atypical lobular hyperplasia.
EXAM:
BREAST STEREOTACTIC CORE NEEDLE BIOPSY

[L CC (1 of 2)]
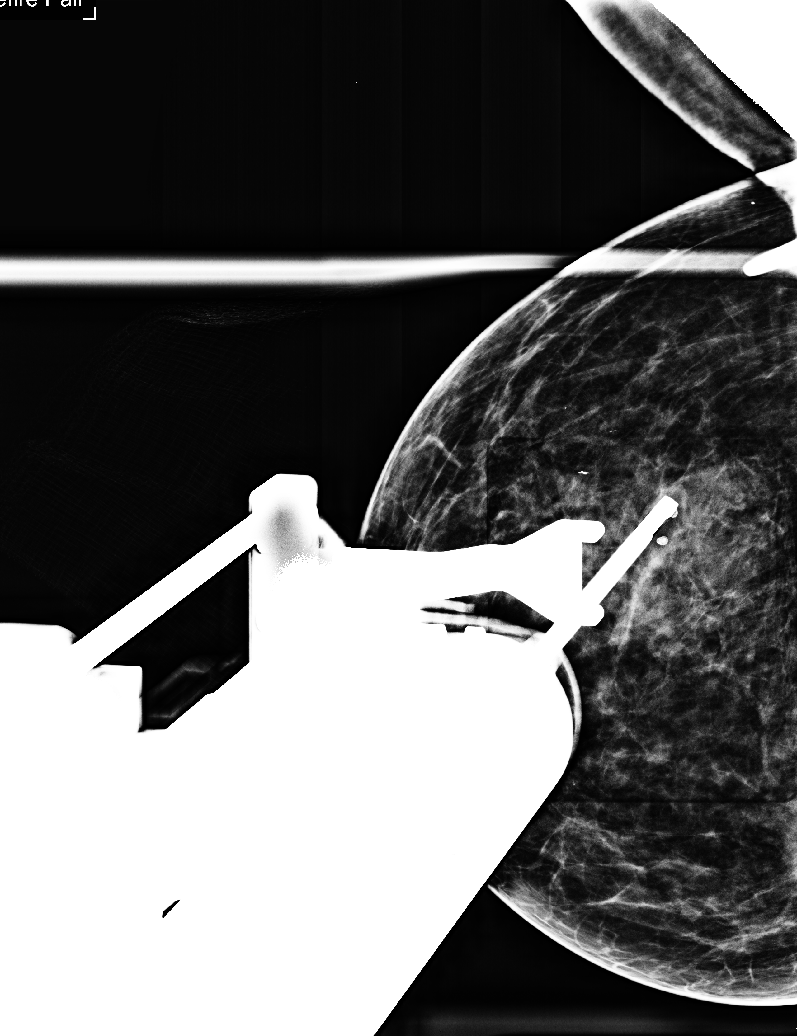

[L CC (2 of 2)]
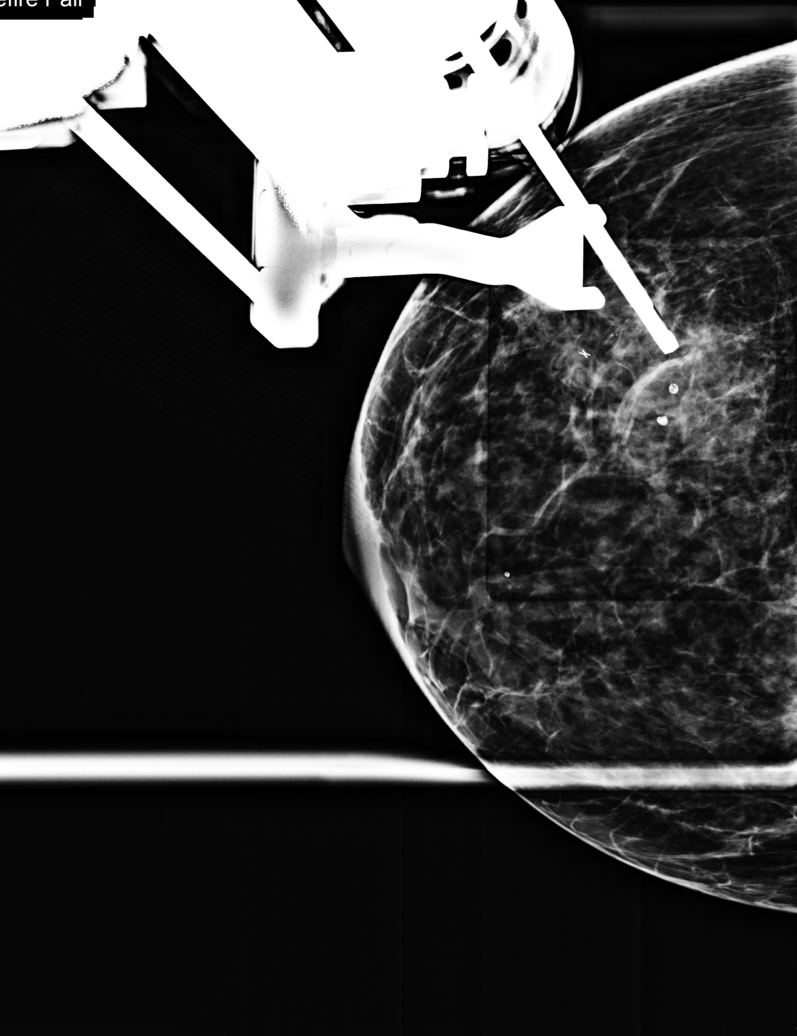

[L CC tomo (1 of 4)]
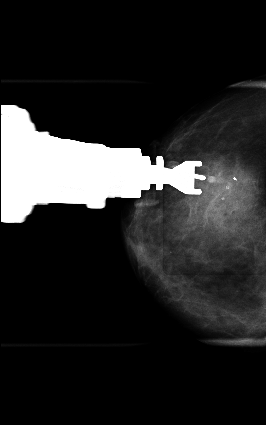

[L CC tomo (2 of 4)]
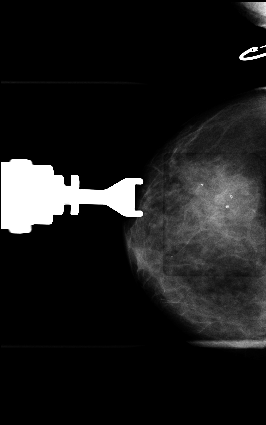

[L CC tomo · 3 of 58 frames shown (3 of 4)]
[frame 19/58]
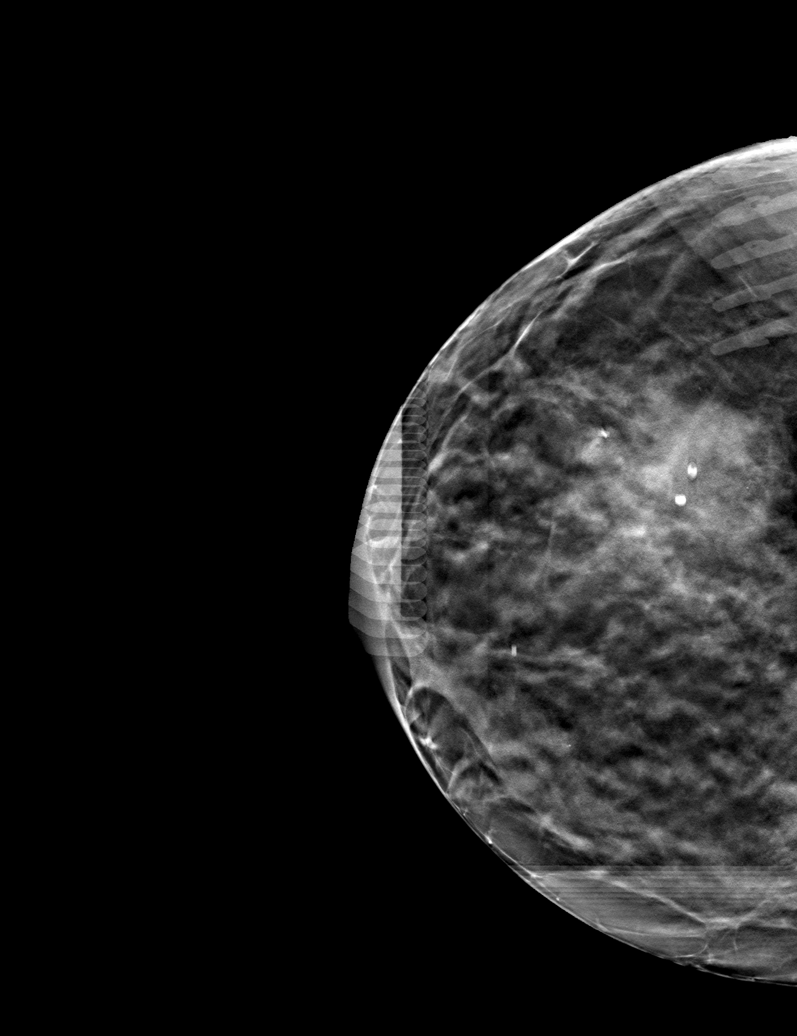
[frame 29/58]
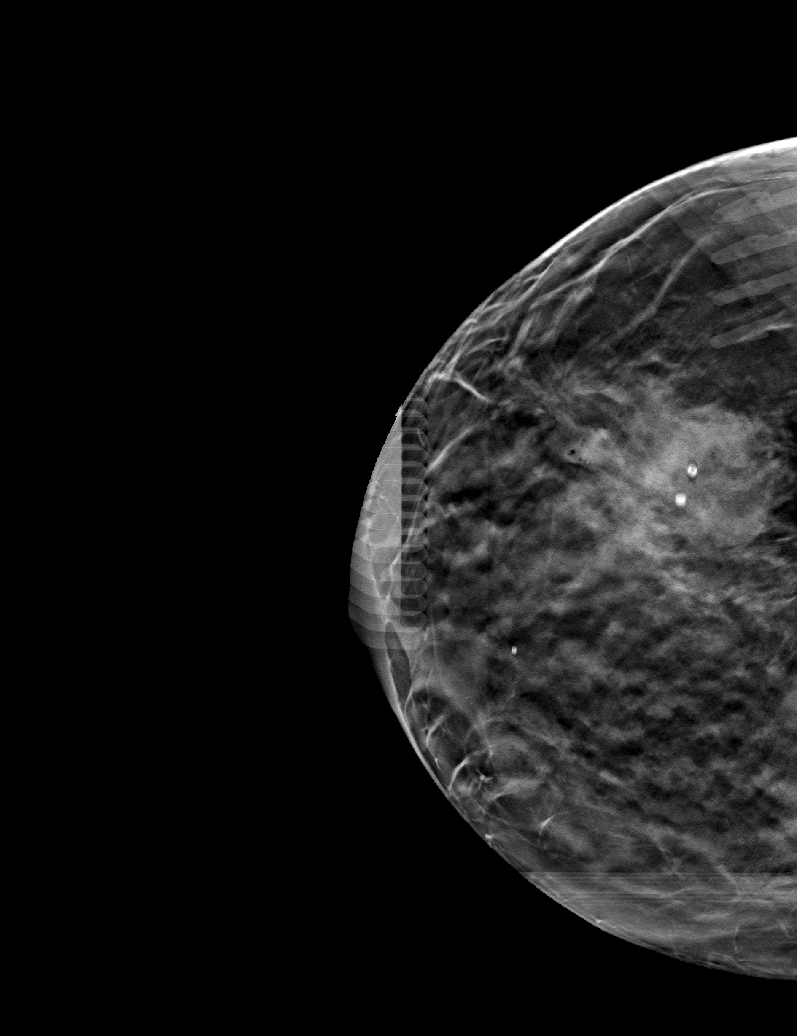
[frame 40/58]
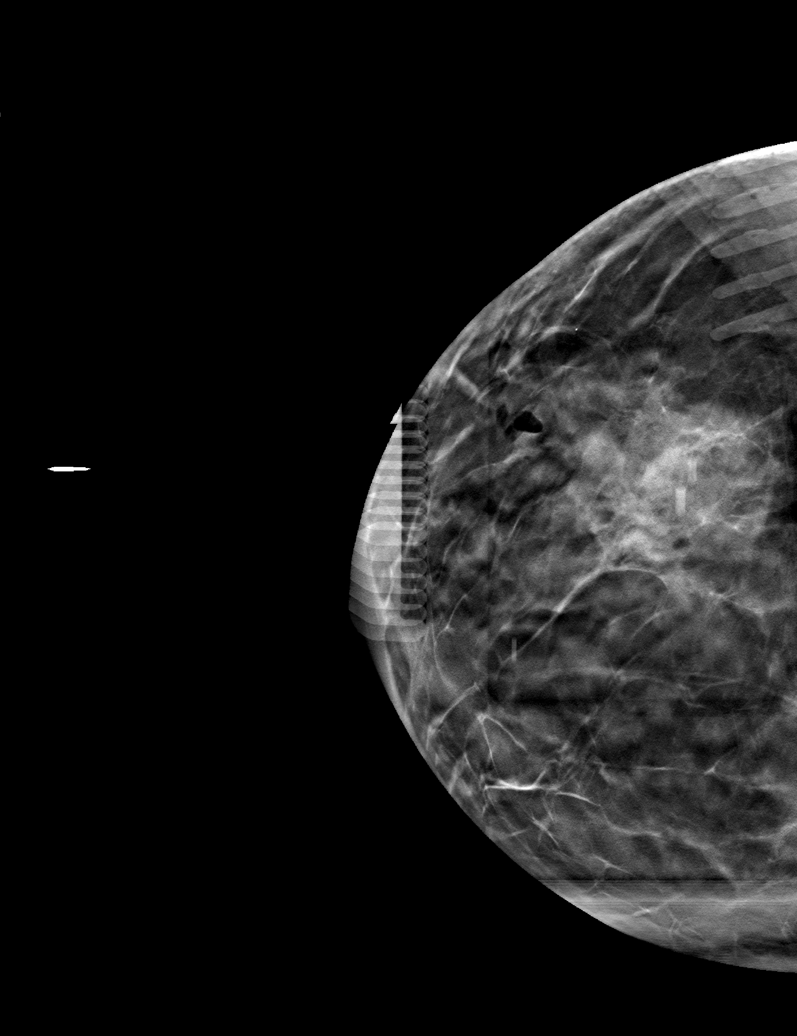

[L CC tomo (4 of 4) · tomo slice 29/58.0]
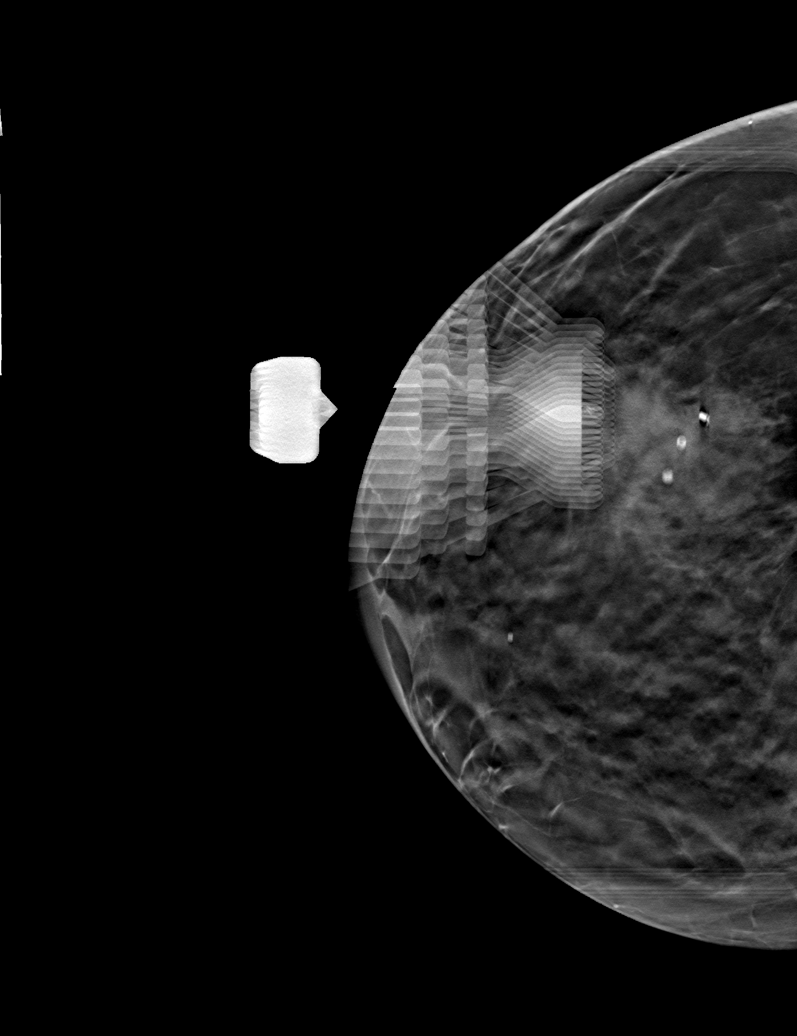

[8 of 14 positions shown; findings below may reference images not displayed]



Using sterile technique and 1% Lidocaine as local anesthetic, under
stereotactic guidance, a 9 gauge vacuum assist device was used to
perform core needle biopsy of 2 areas of architectural distortion in
the upper inner left breast using a cranial to caudal approach.

At the conclusion of the procedure, a ribbon tissue marker clip was
deployed into the more anterior biopsy cavity and a coil shaped
biopsy clip deployed into the more posterior biopsy cavity, both
well-positioned. Follow-up 2-view mammogram was performed and
dictated separately.
IMPRESSION: Stereotactic-guided biopsy of 2 adjacent areas of architectural
distortion in the upper inner left breast. No apparent
complications.

## 2017-03-06 ENCOUNTER — Other Ambulatory Visit: Payer: Self-pay | Admitting: Nurse Practitioner

## 2017-03-06 ENCOUNTER — Ambulatory Visit
Admission: RE | Admit: 2017-03-06 | Discharge: 2017-03-06 | Disposition: A | Discharge status: Home / Self Care | Payer: Medicare Other | Source: Ambulatory Visit | Attending: Nurse Practitioner | Admitting: Nurse Practitioner

## 2017-03-06 DIAGNOSIS — M25512 Pain in left shoulder: Secondary | ICD-10-CM

## 2017-07-14 ENCOUNTER — Ambulatory Visit (HOSPITAL_BASED_OUTPATIENT_CLINIC_OR_DEPARTMENT_OTHER): Payer: Medicare Other | Admitting: Oncology

## 2017-07-14 ENCOUNTER — Other Ambulatory Visit (HOSPITAL_BASED_OUTPATIENT_CLINIC_OR_DEPARTMENT_OTHER): Payer: Medicare Other

## 2017-07-14 DIAGNOSIS — Z170 Estrogen receptor positive status [ER+]: Secondary | ICD-10-CM

## 2017-07-14 DIAGNOSIS — C50212 Malignant neoplasm of upper-inner quadrant of left female breast: Secondary | ICD-10-CM | POA: Diagnosis not present

## 2017-07-14 DIAGNOSIS — N898 Other specified noninflammatory disorders of vagina: Secondary | ICD-10-CM | POA: Diagnosis not present

## 2017-07-14 DIAGNOSIS — M8580 Other specified disorders of bone density and structure, unspecified site: Secondary | ICD-10-CM | POA: Diagnosis not present

## 2017-07-14 DIAGNOSIS — C50511 Malignant neoplasm of lower-outer quadrant of right female breast: Secondary | ICD-10-CM

## 2017-07-14 LAB — COMPREHENSIVE METABOLIC PANEL
ALBUMIN: 4.2 g/dL (ref 3.5–5.0)
ALK PHOS: 77 U/L (ref 40–150)
ALT: 15 U/L (ref 0–55)
ANION GAP: 9 meq/L (ref 3–11)
AST: 16 U/L (ref 5–34)
BILIRUBIN TOTAL: 0.43 mg/dL (ref 0.20–1.20)
BUN: 20 mg/dL (ref 7.0–26.0)
CALCIUM: 10 mg/dL (ref 8.4–10.4)
CO2: 27 meq/L (ref 22–29)
CREATININE: 0.8 mg/dL (ref 0.6–1.1)
Chloride: 104 mEq/L (ref 98–109)
EGFR: 64 mL/min/{1.73_m2} — ABNORMAL LOW (ref >90)
Glucose: 97 mg/dl (ref 70–140)
Potassium: 4.5 mEq/L (ref 3.5–5.1)
Sodium: 141 mEq/L (ref 136–145)
TOTAL PROTEIN: 7.4 g/dL (ref 6.4–8.3)

## 2017-07-14 LAB — CBC WITH DIFFERENTIAL/PLATELET
BASO%: 0.4 % (ref 0.0–2.0)
BASOS ABS: 0 10*3/uL (ref 0.0–0.1)
EOS%: 1 % (ref 0.0–7.0)
Eosinophils Absolute: 0.1 10*3/uL (ref 0.0–0.5)
HCT: 38.1 % (ref 34.8–46.6)
HGB: 12.6 g/dL (ref 11.6–15.9)
LYMPH%: 23.1 % (ref 14.0–49.7)
MCH: 30.7 pg (ref 25.1–34.0)
MCHC: 33.1 g/dL (ref 31.5–36.0)
MCV: 92.7 fL (ref 79.5–101.0)
MONO#: 0.5 10*3/uL (ref 0.1–0.9)
MONO%: 6.3 % (ref 0.0–14.0)
NEUT%: 69.2 % (ref 38.4–76.8)
NEUTROS ABS: 5.1 10*3/uL (ref 1.5–6.5)
PLATELETS: 219 10*3/uL (ref 145–400)
RBC: 4.11 10*6/uL (ref 3.70–5.45)
RDW: 12.5 % (ref 11.2–14.5)
WBC: 7.4 10*3/uL (ref 3.9–10.3)
lymph#: 1.7 10*3/uL (ref 0.9–3.3)

## 2017-07-14 NOTE — Progress Notes (Signed)
[place]  [phone] [phone]     ID: Courtney Shea DOB: 06-Jun-1930  MR#: [id number]  TML#:[id number]  Patient Care Team: Josetta Huddle, MD as PCP - General (Internal Medicine) Magrinat, Virgie Dad, MD as Consulting Physician (Oncology) Eppie Gibson, MD as Attending Physician (Radiation Oncology) Erroll Luna, MD as Consulting Physician (General Surgery) Mauro Kaufmann, RN as Registered Nurse Rockwell Germany, RN as Registered Nurse Holley Bouche, NP as Nurse Practitioner (Nurse Practitioner) Sylvan Cheese, NP as Nurse Practitioner (Hematology and Oncology) PCP: Josetta Huddle, MD GYN: Dory Horn MD OTHER MD: Kirk Ruths MD, Wilhemina Bonito MD  CHIEF COMPLAINT: Estrogen receptor positive breast cancer  CURRENT TREATMENT: anastrozole  BREAST CANCER HISTORY: From the original intake note:  The patient has had multiple diagnostic mammographies and ultrasonography his dating back to September 2013 following 2 areas in the left breast, one of the 2:00 position, the other at the 3:00 position. These were felt to be most likely fibroadenomatous. Repeat left ultrasonography 02/04/2013 showed them to be stable. On 04/11/2014 bilateral diagnostic mammography with ultrasonography found nothing on mammography, and by ultrasound the 2 masses previously described were again seen and were again unchanged.  On 04/16/2015, with bilateral diagnostic mammography with tomosynthesis and bilateral breast ultrasonography at the breast Center, the patient's breast density was read as category D. In the right breast there was an area of distortion in the lower outer quadrant. This was not palpable. By ultrasound there was a vague area of distortion measuring 0.9 cm. The right axilla was negative.  In the left breast upper inner quadrant there was an area of possible distortion with no discrete mass. The previously noted fibroadenomas were again  seen.  On 04/27/2015 the patient underwent stereotactic biopsy of the right lower outer quadrant area of distortion. This showed ([id number]) atypical lobular hyperplasia including a radial scar. On 05/03/2015 the patient underwent biopsy of two adjacent areas of architectural distortion in the upper inner left breast. This showed ([id number]) invasive lobular breast cancer in both biopsies, which were morphologically identical. Estrogen receptor was 95% positive. Progesterone receptor was 1% "positive". The proliferation marker was 5%. There was no HER-2 amplification, with a signals ratio of 1.52, the number per cell being 2.05.  On 05/08/2015 the patient underwent bilateral breast MRIs. In the right breast only post biopsy changes were seen. In the left breast there was a 3.27 m area of ill-defined enhancement and a second area measuring 1.3 cm, the whole measuring up to 4.0 cm at least when added together. There was a level I left axillary lymph node with minimal cortical thickening of uncertain significance.  The patient's subsequent history is as detailed below  INTERVAL HISTORY: Courtney Shea returns today for follow-up and treatment of her estrogen receptor positive breast cancer. She continues on anastrozole, which she is tolerating well without hot flashes. She reports a chronic hx of vaginal dryness. Pt notes that she obtains anastrozole at under $10 a month. She notes that she has been occupied with her family recently and enjoys it.   REVIEW OF SYSTEMS: Courtney Shea reports intermittent exercise by incorporating the Physical Therapy exercises that was prescribed 2 years ago for knee pain into her exercise regimen. She denies unusual headaches, visual changes, nausea, vomiting, or dizziness. There has been no unusual cough, phlegm production, or pleurisy. This been no change in bowel or bladder habits. She denies unexplained fatigue or unexplained weight loss, bleeding, rash, or fever. A detailed  review of systems was otherwise negative.   PAST MEDICAL HISTORY: Past Medical History:  Diagnosis Date  . Breast cancer ([place])   . Breast cancer of lower-outer quadrant of right female breast ([place]) 05/07/2015  . GERD (gastroesophageal reflux disease)   . Hypertension   . Hypothyroid   . Stroke [facility])    TIA, no deficits, placed on Plavix  . Syncope   . Ventricular bigeminy     PAST SURGICAL HISTORY: Past Surgical History:  Procedure Laterality Date  . ABDOMINAL HYSTERECTOMY    . BLADDER SUSPENSION    . CATARACT EXTRACTION    . INCONTINENCE SURGERY    . Ovarian cyst removed    . PARTIAL MASTECTOMY WITH NEEDLE LOCALIZATION Bilateral 05/29/2015   Procedure: LEFT BREAST PARTIAL MASTECTOMY WITH NEEDLE LOCALIZATION TIMES 2 AND RIGHT BREAST PARTIAL MASTECTOMY WITH NEEDLE LOCALIZATION;  Surgeon: Erroll Luna, MD;  Location: [place];  Service: General;  Laterality: Bilateral;  . RE-EXCISION OF BREAST LUMPECTOMY Left 06/21/2015   Procedure: LEFT BREAST RE EXCISION OF LUMPECTOMY;  Surgeon: Erroll Luna, MD;  Location: [place];  Service: General;  Laterality: Left;    FAMILY HISTORY Family History  Problem Relation Age of Onset  . Coronary artery disease Mother        MI at age 69  . Breast cancer Sister 70  . Kidney disease Brother   . Cancer Sister        colon cancer suggested   the patient's father died at age 20, the patient's mother at age 65, both from strokes. The patient had one brother, 2 sisters. One sister was diagnosed with breast cancer at age 4. There is no history of ovarian cancer in the family.  GYNECOLOGIC HISTORY:  No LMP recorded. Patient is postmenopausal. Menarche age 53, first live birth age 73. The patient is GX P2. She underwent hysterectomy without salpingo-oophorectomy remotely, but then had bilateral salpingo-oophorectomy subsequently for removal ovarian cysts. She took hormone replacement for about 20 years, stopping in  2014.  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 Her husband [name] is a former Chief Financial Officer. Daughter [name] numb works for the [employer], orienting at risk students. Son Rodman Key lives in [place] where he does research and development for [employer]. The patient has 3 grandchildren. She attends the [employer] (her son-in-law is Theme park manager.    ADVANCED DIRECTIVES: In place   HEALTH MAINTENANCE: Social History  Substance Use Topics  . Smoking status: Never Smoker  . Smokeless tobacco: Never Used  . Alcohol use No     Colonoscopy: 2011/[facility]  PAP: 2016  Bone density: 2001/normal  Lipid panel:  Allergies  Allergen Reactions  . Macrodantin [Nitrofurantoin Macrocrystal] Nausea Only    "extreme nausea"  . Codeine Nausea And Vomiting  . Crestor [Rosuvastatin Calcium] Other (See Comments)    "aching in legs"  . Doxycycline Itching and Rash  . Hydrochlorothiazide Rash    "all over body rash"  . Norvasc [Amlodipine Besylate] Swelling    "swelling in ankles"  . Sulfa Antibiotics Nausea And Vomiting    Current Outpatient Prescriptions  Medication Sig Dispense Refill  . anastrozole (ARIMIDEX) 1 MG tablet Take 1 tablet (1 mg total) by mouth daily. 1 tablet 12  . aspirin EC 81 MG tablet Take 81 mg by mouth once.     . Calcium Carbonate-Vitamin D (CALCIUM 600 + D PO) Take 1 tablet by mouth every  evening.     . clobetasol (TEMOVATE) 0.05 % external solution APPLY TO SCALP EVERY DAY  2  . cloNIDine (CATAPRES) 0.2 MG tablet Take 0.2 mg by mouth at bedtime.  11  . clopidogrel (PLAVIX) 75 MG tablet Take 75 mg by mouth daily.    . Cranberry 500 MG CAPS Take 500 mg by mouth daily.    . furosemide (LASIX) 20 MG tablet Take 20 mg by mouth daily.     Marland Kitchen levothyroxine (SYNTHROID, LEVOTHROID) 88 MCG tablet Take 88 mcg by mouth daily.    Marland Kitchen losartan (COZAAR) 100 MG tablet Take 100 mg by mouth daily.    . metoprolol succinate  (TOPROL-XL) 50 MG 24 hr tablet Take 50 mg by mouth daily. Take with or immediately following a meal.    . pantoprazole (PROTONIX) 40 MG tablet Take 40 mg by mouth every evening.    . sennosides-docusate sodium (SENOKOT-S) 8.6-50 MG tablet Take 1 tablet by mouth daily as needed for constipation.      No current facility-administered medications for this visit.     OBJECTIVE: Older white woman In no acute distress  Vitals:   07/14/17 1300  BP: (!) 163/94  Pulse: (!) 51  Resp: 18  Temp: 97.6 F (36.4 C)  SpO2: 100%     Body mass index is 26.69 kg/m.    ECOG FS:1 - Symptomatic but completely ambulatory  Sclerae unicteric, EOMs intact Oropharynx clear and moist No cervical or supraclavicular adenopathy Lungs no rales or rhonchi Heart regular rate and rhythm Abd soft, nontender, positive bowel sounds MSK no focal spinal tenderness, no upper extremity lymphedema Neuro: nonfocal, well oriented, appropriate affect Breasts: The left breast is undergone lumpectomy and radiation with no evidence of local recurrence. Both axillae are benign.  LAB RESULTS:  CMP     Component Value Date/Time   NA 141 07/14/2017 1239   K 4.5 07/14/2017 1239   CL 104 06/19/2015 1144   CO2 27 07/14/2017 1239   GLUCOSE 97 07/14/2017 1239   BUN 20.0 07/14/2017 1239   CREATININE 0.8 07/14/2017 1239   CALCIUM 10.0 07/14/2017 1239   PROT 7.4 07/14/2017 1239   ALBUMIN 4.2 07/14/2017 1239   AST 16 07/14/2017 1239   ALT 15 07/14/2017 1239   ALKPHOS 77 07/14/2017 1239   BILITOT 0.43 07/14/2017 1239   GFRNONAA >60 06/19/2015 1144   GFRAA >60 06/19/2015 1144    INo results found for: SPEP, UPEP  Lab Results  Component Value Date   WBC 7.4 07/14/2017   NEUTROABS 5.1 07/14/2017   HGB 12.6 07/14/2017   HCT 38.1 07/14/2017   MCV 92.7 07/14/2017   PLT 219 07/14/2017      Chemistry      Component Value Date/Time   NA 141 07/14/2017 1239   K 4.5 07/14/2017 1239   CL 104 06/19/2015 1144   CO2 27  07/14/2017 1239   BUN 20.0 07/14/2017 1239   CREATININE 0.8 07/14/2017 1239      Component Value Date/Time   CALCIUM 10.0 07/14/2017 1239   ALKPHOS 77 07/14/2017 1239   AST 16 07/14/2017 1239   ALT 15 07/14/2017 1239   BILITOT 0.43 07/14/2017 1239       No results found for: LABCA2  No components found for: LABCA125  No results for input(s): INR in the last 168 hours.  Urinalysis    Component Value Date/Time   COLORURINE YELLOW 06/20/2013 0910   APPEARANCEUR CLEAR 06/20/2013 0910   LABSPEC 1.004 (  L) 06/20/2013 0910   PHURINE 6.5 06/20/2013 0910   GLUCOSEU NEGATIVE 06/20/2013 0910   HGBUR NEGATIVE 06/20/2013 0910   BILIRUBINUR NEGATIVE 06/20/2013 0910   KETONESUR NEGATIVE 06/20/2013 0910   PROTEINUR NEGATIVE 06/20/2013 0910   UROBILINOGEN 0.2 06/20/2013 0910   NITRITE NEGATIVE 06/20/2013 0910   LEUKOCYTESUR TRACE (A) 06/20/2013 0910    STUDIES: Diagnostic Mammography with tomography 09/15/2016 at the [facility] showed the breast density to be category C. There was no evidence of malignancy.  ASSESSMENT: 83 y.o. Starling Manns woman status post left breast biopsy 05/03/2015 of 2 separate masses, clinically T2 N0, stage IIA invasive lobular breast cancer, grade 2, estrogen receptor positive, progesterone receptor 1% "positive", HER-2 not amplified  (a) right breast biopsy 04/27/2015 shows atypical lobular hyperplasia/complex sclerotic lesion  (1) status post left lumpectomy on 05/29/2015 for a pT3, NX, stage II invasive lobular breast cancer, grade 2, estrogen receptor positive, HER-2 negative, with closebut negative margins  (a) reexcision of left lumpectomy on 06/21/2015 demonstrated several areas of fatty necrosis and atypical lobular hyperplasia  (2) radiation10/01/2015-09/03/2015: Left Breast. 50 Gy in 25 fractions.   (3) anastrozole started 10/25/15  (4) osteopenia: T-score 1.5 on 09/04/2015  PLAN: Courtney Shea Is now 2 years out from definitive surgery for breast cancer  with no evidence of disease recurrence. This is very favorable.  She is tolerating anastrozole well, and the plan is to continue that for total of 5 years.  She is due for repeat bone density, we will obtain that with her mammogram this November  She will return to see me in one year. She knows to call for any problems that may develop before that visit.  Magrinat, Virgie Dad, MD  07/14/17 1:26 PM Medical Oncology and Hematology [facility] [address] [place], [place] [address] Tel. [phone]    Fax. [phone]  This document serves as a record of services personally performed by Lurline Del, MD. It was created on her behalf by Steva Colder, a trained medical scribe. The creation of this record is based on the scribe's personal observations and the provider's statements to them. This document has been checked and approved by the attending provider.

## 2017-07-20 ENCOUNTER — Other Ambulatory Visit: Payer: Self-pay | Admitting: Oncology

## 2017-07-20 DIAGNOSIS — Z170 Estrogen receptor positive status [ER+]: Principal | ICD-10-CM

## 2017-07-20 DIAGNOSIS — Z853 Personal history of malignant neoplasm of breast: Secondary | ICD-10-CM

## 2017-07-20 DIAGNOSIS — E2839 Other primary ovarian failure: Secondary | ICD-10-CM

## 2017-07-20 DIAGNOSIS — C50212 Malignant neoplasm of upper-inner quadrant of left female breast: Secondary | ICD-10-CM

## 2017-09-16 ENCOUNTER — Ambulatory Visit
Admission: RE | Admit: 2017-09-16 | Discharge: 2017-09-16 | Disposition: A | Discharge status: Home / Self Care | Payer: Medicare Other | Source: Ambulatory Visit | Attending: Oncology | Admitting: Oncology

## 2017-09-16 ENCOUNTER — Telehealth: Payer: Self-pay

## 2017-09-16 DIAGNOSIS — Z853 Personal history of malignant neoplasm of breast: Secondary | ICD-10-CM

## 2017-09-16 DIAGNOSIS — E2839 Other primary ovarian failure: Secondary | ICD-10-CM

## 2017-09-16 DIAGNOSIS — Z170 Estrogen receptor positive status [ER+]: Principal | ICD-10-CM

## 2017-09-16 DIAGNOSIS — C50212 Malignant neoplasm of upper-inner quadrant of left female breast: Secondary | ICD-10-CM

## 2017-09-16 HISTORY — DX: Personal history of irradiation: Z92.3

## 2017-09-16 NOTE — Telephone Encounter (Signed)
Left message on patients vm to call back to center in regards to bone density scan results.

## 2017-09-21 ENCOUNTER — Telehealth: Payer: Self-pay

## 2017-09-21 NOTE — Telephone Encounter (Signed)
Patient returning call from LPN.  Bone density scan results relayed to patient as osteopenia present.  Advised patient to start calcium and vitamin d if she was not already taking, but she states she is.  And encouraged to do weight bearing exercises such as walking.  Patient voiced understanding and had no questions at this time.

## 2017-12-01 ENCOUNTER — Other Ambulatory Visit: Payer: Self-pay

## 2017-12-01 DIAGNOSIS — Z170 Estrogen receptor positive status [ER+]: Principal | ICD-10-CM

## 2017-12-01 DIAGNOSIS — C50511 Malignant neoplasm of lower-outer quadrant of right female breast: Secondary | ICD-10-CM

## 2017-12-01 MED ORDER — ANASTROZOLE 1 MG PO TABS: 1.0000 mg | ORAL_TABLET | Freq: Every day | ORAL | 12 refills | Status: DC

## 2017-12-20 ENCOUNTER — Emergency Department (HOSPITAL_COMMUNITY)
Admission: EM | Admit: 2017-12-20 | Discharge: 2017-12-20 | Disposition: A | Discharge status: Home / Self Care | Payer: Medicare Other | Attending: Physician Assistant | Admitting: Physician Assistant

## 2017-12-20 ENCOUNTER — Emergency Department (HOSPITAL_COMMUNITY): Payer: Medicare Other

## 2017-12-20 ENCOUNTER — Encounter (HOSPITAL_COMMUNITY): Payer: Self-pay | Admitting: Emergency Medicine

## 2017-12-20 DIAGNOSIS — Z853 Personal history of malignant neoplasm of breast: Secondary | ICD-10-CM | POA: Diagnosis not present

## 2017-12-20 DIAGNOSIS — W1839XA Other fall on same level, initial encounter: Secondary | ICD-10-CM | POA: Insufficient documentation

## 2017-12-20 DIAGNOSIS — Y9389 Activity, other specified: Secondary | ICD-10-CM | POA: Insufficient documentation

## 2017-12-20 DIAGNOSIS — Z8673 Personal history of transient ischemic attack (TIA), and cerebral infarction without residual deficits: Secondary | ICD-10-CM | POA: Diagnosis not present

## 2017-12-20 DIAGNOSIS — Z7902 Long term (current) use of antithrombotics/antiplatelets: Secondary | ICD-10-CM | POA: Diagnosis not present

## 2017-12-20 DIAGNOSIS — Z23 Encounter for immunization: Secondary | ICD-10-CM | POA: Insufficient documentation

## 2017-12-20 DIAGNOSIS — Y9222 Religious institution as the place of occurrence of the external cause: Secondary | ICD-10-CM | POA: Diagnosis not present

## 2017-12-20 DIAGNOSIS — Y999 Unspecified external cause status: Secondary | ICD-10-CM | POA: Insufficient documentation

## 2017-12-20 DIAGNOSIS — E039 Hypothyroidism, unspecified: Secondary | ICD-10-CM | POA: Insufficient documentation

## 2017-12-20 DIAGNOSIS — I10 Essential (primary) hypertension: Secondary | ICD-10-CM | POA: Insufficient documentation

## 2017-12-20 DIAGNOSIS — S098XXA Other specified injuries of head, initial encounter: Secondary | ICD-10-CM | POA: Diagnosis present

## 2017-12-20 DIAGNOSIS — S0091XA Abrasion of unspecified part of head, initial encounter: Secondary | ICD-10-CM | POA: Insufficient documentation

## 2017-12-20 DIAGNOSIS — Z7982 Long term (current) use of aspirin: Secondary | ICD-10-CM | POA: Insufficient documentation

## 2017-12-20 DIAGNOSIS — S0990XA Unspecified injury of head, initial encounter: Secondary | ICD-10-CM

## 2017-12-20 MED ORDER — TETANUS-DIPHTH-ACELL PERTUSSIS 5-2.5-18.5 LF-MCG/0.5 IM SUSP
0.5000 mL | Freq: Once | INTRAMUSCULAR | Status: AC
  Administered 2017-12-20: 0.5 mL via INTRAMUSCULAR
  Filled 2017-12-20: qty 0.5

## 2017-12-20 NOTE — Discharge Instructions (Signed)
Please return if you have any symptoms.  Your CAT scan appears normal.  You may have some bleeding from your wound but there is not anything that we can repair at this time.  Please just hold pressure on it.  You may wash your hair this evening.  Only use light pressure.

## 2017-12-20 NOTE — ED Triage Notes (Signed)
Patient here from urgent care with complaints of fall today while at church. Patient on Plavix, bleeding controlled. Hypertensive.

## 2017-12-20 NOTE — ED Provider Notes (Signed)
[place] DEPT Provider Note   CSN: [id number] Arrival date & time: 12/20/17  1336     History   Chief Complaint Chief Complaint  Patient presents with  . Fall  . Laceration    HPI Courtney Shea is a 84 y.o. female.  HPI  Patient is a 84 year old female presenting with mechanical fall today.  Patient's granddaughter ran out of the cartridge and she fell over.  Patient struck the back of her head.  No loss of consciousness.  She went to urgent care was sent here for CAT scan.  Patient has no dizziness, mild headache.  No focal neuro deficits.  Patient is on Plavix.  Past Medical History:  Diagnosis Date  . Breast cancer ([place])   . Breast cancer of lower-outer quadrant of right female breast ([place]) 05/07/2015  . GERD (gastroesophageal reflux disease)   . Hypertension   . Hypothyroid   . Personal history of radiation therapy   . Stroke [facility])    TIA, no deficits, placed on Plavix  . Syncope   . Ventricular bigeminy     Patient Active Problem List   Diagnosis Date Noted  . Malignant neoplasm of upper-inner quadrant of left breast in female, estrogen receptor positive ([place]) 07/14/2017  . Osteopenia 11/07/2015  . Carcinoma of upper-inner quadrant of left female breast ([place]) 07/03/2015  . TIA (transient ischemic attack) 06/20/2013  . Hypokalemia 06/20/2013  . Ventricular bigeminy 12/05/2011  . Leukocytosis 12/05/2011  . Syncope 12/04/2011  . Hypothyroidism 12/04/2011  . HTN (hypertension) 12/04/2011  . UTI (lower urinary tract infection) 12/04/2011  . PAIN IN LIMB 04/02/2007    Past Surgical History:  Procedure Laterality Date  . ABDOMINAL HYSTERECTOMY    . BLADDER SUSPENSION    . BREAST LUMPECTOMY Right    2016  . CATARACT EXTRACTION    . INCONTINENCE SURGERY    . Ovarian cyst removed    . PARTIAL MASTECTOMY WITH NEEDLE LOCALIZATION Bilateral 05/29/2015   Procedure: LEFT BREAST PARTIAL MASTECTOMY WITH NEEDLE LOCALIZATION TIMES 2  AND RIGHT BREAST PARTIAL MASTECTOMY WITH NEEDLE LOCALIZATION;  Surgeon: Erroll Luna, MD;  Location: [place];  Service: General;  Laterality: Bilateral;  . RE-EXCISION OF BREAST LUMPECTOMY Left 06/21/2015   Procedure: LEFT BREAST RE EXCISION OF LUMPECTOMY;  Surgeon: Erroll Luna, MD;  Location: [place];  Service: General;  Laterality: Left;    OB History    No data available       Home Medications    Prior to Admission medications   Medication Sig Start Date End Date Taking? Authorizing Provider  anastrozole (ARIMIDEX) 1 MG tablet Take 1 tablet (1 mg total) by mouth daily. 12/01/17   Magrinat, Virgie Dad, MD  aspirin EC 81 MG tablet Take 81 mg by mouth once.     [provider]  Calcium Carbonate-Vitamin D (CALCIUM 600 + D PO) Take 1 tablet by mouth every evening.     [provider]  clobetasol (TEMOVATE) 0.05 % external solution APPLY TO SCALP EVERY DAY 06/28/15   [provider]  cloNIDine (CATAPRES) 0.2 MG tablet Take 0.2 mg by mouth at bedtime. 04/27/15   [provider]  clopidogrel (PLAVIX) 75 MG tablet Take 75 mg by mouth daily.    [provider]  Cranberry 500 MG CAPS Take 500 mg by mouth daily.    [provider]  furosemide (LASIX) 20 MG tablet Take 20 mg by mouth daily.  [provider]  levothyroxine (SYNTHROID, LEVOTHROID) 88 MCG tablet Take 88 mcg by mouth daily.    [provider]  losartan (COZAAR) 100 MG tablet Take 100 mg by mouth daily.    [provider]  metoprolol succinate (TOPROL-XL) 50 MG 24 hr tablet Take 50 mg by mouth daily. Take with or immediately following a meal.    [provider]  pantoprazole (PROTONIX) 40 MG tablet Take 40 mg by mouth every evening.    [provider]  sennosides-docusate sodium (SENOKOT-S) 8.6-50 MG tablet Take 1 tablet by mouth daily as needed for constipation.     [provider]    Family  History Family History  Problem Relation Age of Onset  . Coronary artery disease Mother        MI at age 27  . Breast cancer Sister 68  . Kidney disease Brother   . Cancer Sister        colon cancer suggested    Social History Social History   Tobacco Use  . Smoking status: Never Smoker  . Smokeless tobacco: Never Used  Substance Use Topics  . Alcohol use: No  . Drug use: No     Allergies   Macrodantin [nitrofurantoin macrocrystal]; Codeine; Crestor [rosuvastatin calcium]; Doxycycline; Hydrochlorothiazide; Norvasc [amlodipine besylate]; and Sulfa antibiotics   Review of Systems Review of Systems  Constitutional: Negative for activity change.  Respiratory: Negative for shortness of breath.   Cardiovascular: Negative for chest pain.  Gastrointestinal: Negative for abdominal pain.  Neurological: Positive for headaches.  All other systems reviewed and are negative.    Physical Exam Updated Vital Signs BP (!) 152/110 (BP Location: Right Arm)   Pulse (!) 104   Temp 97.9 F (36.6 C) (Oral)   Resp 18   SpO2 98%   Physical Exam  Constitutional: She is oriented to person, place, and time. She appears well-developed and well-nourished.  HENT:  Head: Normocephalic and atraumatic.  Hematoma with abrasion/bleeding overlying.  Eyes: Pupils are equal, round, and reactive to light. Right eye exhibits no discharge. Left eye exhibits no discharge.  Cardiovascular: Normal rate.  Pulmonary/Chest: Effort normal.  Neurological: She is oriented to person, place, and time.  He was equal round reactive to light.  No cranial nerve deficit noticed.  Skin: Skin is warm and dry. She is not diaphoretic.  Psychiatric: She has a normal mood and affect.  Nursing note and vitals reviewed.    ED Treatments / Results  Labs (all labs ordered are listed, but only abnormal results are displayed) Labs Reviewed - No data to display  EKG  EKG Interpretation None       Radiology No  results found.  Procedures Procedures (including critical care time)  Medications Ordered in ED Medications  Tdap (BOOSTRIX) injection 0.5 mL (not administered)     Initial Impression / Assessment and Plan / ED Course  I have reviewed the triage vital signs and the nursing notes.  Pertinent labs & imaging results that were available during my care of the patient were reviewed by me and considered in my medical decision making (see chart for details).    Patient is a 84 year old female presenting with mechanical fall today.  Patient's granddaughter ran out of the cartridge and she fell over.  Patient struck the back of her head.  No loss of consciousness.  She went to urgent care was sent here for CAT scan.  Patient has no dizziness, mild headache.  No focal neuro  deficits.  Patient is on Plavix.    Final Clinical Impressions(s) / ED Diagnoses   Final diagnoses:  None    ED Discharge Orders    None       Hagan Maltz, Fredia Sorrow, MD 12/21/17 1550

## 2018-07-08 ENCOUNTER — Telehealth: Payer: Self-pay | Admitting: Oncology

## 2018-07-08 NOTE — Telephone Encounter (Signed)
GM PAL 9/19 - moved appointments to 10/10 with LC per GM. Spoke with patient.

## 2018-07-15 ENCOUNTER — Other Ambulatory Visit: Payer: Medicare Other

## 2018-07-15 ENCOUNTER — Ambulatory Visit: Payer: Medicare Other | Admitting: Oncology

## 2018-08-05 ENCOUNTER — Inpatient Hospital Stay: Payer: Medicare Other | Attending: Adult Health

## 2018-08-05 ENCOUNTER — Encounter: Payer: Self-pay | Admitting: Adult Health

## 2018-08-05 ENCOUNTER — Inpatient Hospital Stay: Payer: Medicare Other | Admitting: Adult Health

## 2018-08-05 ENCOUNTER — Telehealth: Payer: Self-pay | Admitting: Adult Health

## 2018-08-05 VITALS — BP 170/87 | HR 58 | Temp 97.7°F | Resp 18 | Ht 64.0 in | Wt 162.3 lb

## 2018-08-05 DIAGNOSIS — C50212 Malignant neoplasm of upper-inner quadrant of left female breast: Secondary | ICD-10-CM | POA: Insufficient documentation

## 2018-08-05 DIAGNOSIS — M8580 Other specified disorders of bone density and structure, unspecified site: Secondary | ICD-10-CM

## 2018-08-05 DIAGNOSIS — N951 Menopausal and female climacteric states: Secondary | ICD-10-CM | POA: Diagnosis not present

## 2018-08-05 DIAGNOSIS — R2689 Other abnormalities of gait and mobility: Secondary | ICD-10-CM

## 2018-08-05 DIAGNOSIS — Z170 Estrogen receptor positive status [ER+]: Secondary | ICD-10-CM

## 2018-08-05 DIAGNOSIS — Z8673 Personal history of transient ischemic attack (TIA), and cerebral infarction without residual deficits: Secondary | ICD-10-CM | POA: Diagnosis not present

## 2018-08-05 DIAGNOSIS — Z79899 Other long term (current) drug therapy: Secondary | ICD-10-CM | POA: Diagnosis not present

## 2018-08-05 DIAGNOSIS — C50511 Malignant neoplasm of lower-outer quadrant of right female breast: Secondary | ICD-10-CM

## 2018-08-05 LAB — CBC WITH DIFFERENTIAL/PLATELET
Abs Immature Granulocytes: 0.03 10*3/uL (ref 0.00–0.07)
BASOS ABS: 0 10*3/uL (ref 0.0–0.1)
BASOS PCT: 1 %
EOS PCT: 1 %
Eosinophils Absolute: 0.1 10*3/uL (ref 0.0–0.5)
HEMATOCRIT: 37.8 % (ref 36.0–46.0)
Hemoglobin: 12.6 g/dL (ref 12.0–15.0)
Immature Granulocytes: 0 %
Lymphocytes Relative: 20 %
Lymphs Abs: 1.7 10*3/uL (ref 0.7–4.0)
MCH: 30.2 pg (ref 26.0–34.0)
MCHC: 33.3 g/dL (ref 30.0–36.0)
MCV: 90.6 fL (ref 80.0–100.0)
Monocytes Absolute: 0.7 10*3/uL (ref 0.1–1.0)
Monocytes Relative: 9 %
NRBC: 0 % (ref 0.0–0.2)
Neutro Abs: 5.9 10*3/uL (ref 1.7–7.7)
Neutrophils Relative %: 69 %
PLATELETS: 243 10*3/uL (ref 150–400)
RBC: 4.17 MIL/uL (ref 3.87–5.11)
RDW: 12.8 % (ref 11.5–15.5)
WBC: 8.5 10*3/uL (ref 4.0–10.5)

## 2018-08-05 LAB — COMPREHENSIVE METABOLIC PANEL
ALBUMIN: 4.3 g/dL (ref 3.5–5.0)
ALT: 16 U/L (ref 0–44)
AST: 17 U/L (ref 15–41)
Alkaline Phosphatase: 84 U/L (ref 38–126)
Anion gap: 11 (ref 5–15)
BILIRUBIN TOTAL: 0.4 mg/dL (ref 0.3–1.2)
BUN: 16 mg/dL (ref 8–23)
CO2: 26 mmol/L (ref 22–32)
Calcium: 9.9 mg/dL (ref 8.9–10.3)
Chloride: 104 mmol/L (ref 98–111)
Creatinine, Ser: 0.86 mg/dL (ref 0.44–1.00)
GFR calc Af Amer: >60 mL/min (ref >60)
GFR calc non Af Amer: 60 mL/min — ABNORMAL LOW (ref >60)
GLUCOSE: 95 mg/dL (ref 70–99)
POTASSIUM: 4.4 mmol/L (ref 3.5–5.1)
Sodium: 141 mmol/L (ref 135–145)
TOTAL PROTEIN: 7.6 g/dL (ref 6.5–8.1)

## 2018-08-05 NOTE — Patient Instructions (Signed)
Bone Health Bones protect organs, store calcium, and anchor muscles. Good health habits, such as eating nutritious foods and exercising regularly, are important for maintaining healthy bones. They can also help to prevent a condition that causes bones to lose density and become weak and brittle (osteoporosis). Why is bone mass important? Bone mass refers to the amount of bone tissue that you have. The higher your bone mass, the stronger your bones. An important step toward having healthy bones throughout life is to have strong and dense bones during childhood. A young adult who has a high bone mass is more likely to have a high bone mass later in life. Bone mass at its greatest it is called peak bone mass. A large decline in bone mass occurs in older adults. In women, it occurs about the time of menopause. During this time, it is important to practice good health habits, because if more bone is lost than what is replaced, the bones will become less healthy and more likely to break (fracture). If you find that you have a low bone mass, you may be able to prevent osteoporosis or further bone loss by changing your diet and lifestyle. How can I find out if my bone mass is low? Bone mass can be measured with an X-ray test that is called a bone mineral density (BMD) test. This test is recommended for all women who are age 65 or older. It may also be recommended for men who are age 70 or older, or for people who are more likely to develop osteoporosis due to:  Having bones that break easily.  Having a long-term disease that weakens bones, such as kidney disease or rheumatoid arthritis.  Having menopause earlier than normal.  Taking medicine that weakens bones, such as steroids, thyroid hormones, or hormone treatment for breast cancer or prostate cancer.  Smoking.  Drinking three or more alcoholic drinks each day.  What are the nutritional recommendations for healthy bones? To have healthy bones, you  need to get enough of the right minerals and vitamins. Most nutrition experts recommend getting these nutrients from the foods that you eat. Nutritional recommendations vary from person to person. Ask your health care provider what is healthy for you. Here are some general guidelines. Calcium Recommendations Calcium is the most important (essential) mineral for bone health. Most people can get enough calcium from their diet, but supplements may be recommended for people who are at risk for osteoporosis. Good sources of calcium include:  Dairy products, such as low-fat or nonfat milk, cheese, and yogurt.  Dark green leafy vegetables, such as bok choy and broccoli.  Calcium-fortified foods, such as orange juice, cereal, bread, soy beverages, and tofu products.  Nuts, such as almonds.  Follow these recommended amounts for daily calcium intake:  Children, age 1?3: 700 mg.  Children, age 4?8: 1,000 mg.  Children, age 9?13: 1,300 mg.  Teens, age 14?18: 1,300 mg.  Adults, age 19?50: 1,000 mg.  Adults, age 51?70: ? Men: 1,000 mg. ? Women: 1,200 mg.  Adults, age 71 or older: 1,200 mg.  Pregnant and breastfeeding females: ? Teens: 1,300 mg. ? Adults: 1,000 mg.  Vitamin D Recommendations Vitamin D is the most essential vitamin for bone health. It helps the body to absorb calcium. Sunlight stimulates the skin to make vitamin D, so be sure to get enough sunlight. If you live in a cold climate or you do not get outside often, your health care provider may recommend that you take vitamin   D supplements. Good sources of vitamin D in your diet include:  Egg yolks.  Saltwater fish.  Milk and cereal fortified with vitamin D.  Follow these recommended amounts for daily vitamin D intake:  Children and teens, age 1?18: 600 international units.  Adults, age 50 or younger: 400-800 international units.  Adults, age 51 or older: 800-1,000 international units.  Other Nutrients Other nutrients  for bone health include:  Phosphorus. This mineral is found in meat, poultry, dairy foods, nuts, and legumes. The recommended daily intake for adult men and adult women is 700 mg.  Magnesium. This mineral is found in seeds, nuts, dark green vegetables, and legumes. The recommended daily intake for adult men is 400?420 mg. For adult women, it is 310?320 mg.  Vitamin K. This vitamin is found in green leafy vegetables. The recommended daily intake is 120 mg for adult men and 90 mg for adult women.  What type of physical activity is best for building and maintaining healthy bones? Weight-bearing and strength-building activities are important for building and maintaining peak bone mass. Weight-bearing activities cause muscles and bones to work against gravity. Strength-building activities increases muscle strength that supports bones. Weight-bearing and muscle-building activities include:  Walking and hiking.  Jogging and running.  Dancing.  Gym exercises.  Lifting weights.  Tennis and racquetball.  Climbing stairs.  Aerobics.  Adults should get at least 30 minutes of moderate physical activity on most days. Children should get at least 60 minutes of moderate physical activity on most days. Ask your health care provide what type of exercise is best for you. Where can I find more information? For more information, check out the following websites:  National Osteoporosis Foundation: http://nof.org/learn/basics  National Institutes of Health: http://www.niams.nih.gov/Health_Info/Bone/Bone_Health/bone_health_for_life.asp  This information is not intended to replace advice given to you by your health care provider. Make sure you discuss any questions you have with your health care provider. Document Released: 01/03/2004 Document Revised: 05/02/2016 Document Reviewed: 10/18/2014 Elsevier Interactive Patient Education  2018 Elsevier Inc.  

## 2018-08-05 NOTE — Progress Notes (Signed)
[facility]  [phone] Fax:([phone]) [phone]     ID: Courtney Shea DOB: 27-Dec-1929  MR#: [id number]  KGM#:[id number]  Patient Care Team: Josetta Huddle, MD as PCP - General (Internal Medicine) Magrinat, Virgie Dad, MD as Consulting Physician (Oncology) Eppie Gibson, MD as Attending Physician (Radiation Oncology) Erroll Luna, MD as Consulting Physician (General Surgery) Mauro Kaufmann, RN as Registered Nurse Rockwell Germany, RN as Registered Nurse Holley Bouche, NP (Inactive) as Nurse Practitioner (Nurse Practitioner) Sylvan Cheese, NP as Nurse Practitioner (Hematology and Oncology) PCP: Josetta Huddle, MD GYN: Dory Horn MD OTHER MD: Kirk Ruths MD, Wilhemina Bonito MD  CHIEF COMPLAINT: Estrogen receptor positive breast cancer  CURRENT TREATMENT: anastrozole  BREAST CANCER HISTORY: From the original intake note:  The patient has had multiple diagnostic mammographies and ultrasonography his dating back to September 2013 following 2 areas in the left breast, one of the 2:00 position, the other at the 3:00 position. These were felt to be most likely fibroadenomatous. Repeat left ultrasonography 02/04/2013 showed them to be stable. On 04/11/2014 bilateral diagnostic mammography with ultrasonography found nothing on mammography, and by ultrasound the 2 masses previously described were again seen and were again unchanged.  On 04/16/2015, with bilateral diagnostic mammography with tomosynthesis and bilateral breast ultrasonography at the breast Center, the patient's breast density was read as category D. In the right breast there was an area of distortion in the lower outer quadrant. This was not palpable. By ultrasound there was a vague area of distortion measuring 0.9 cm. The right axilla was negative.  In the left breast upper inner quadrant there was an area of possible distortion with no discrete mass. The previously noted fibroadenomas were  again seen.  On 04/27/2015 the patient underwent stereotactic biopsy of the right lower outer quadrant area of distortion. This showed ([id number]) atypical lobular hyperplasia including a radial scar. On 05/03/2015 the patient underwent biopsy of two adjacent areas of architectural distortion in the upper inner left breast. This showed ([id number]) invasive lobular breast cancer in both biopsies, which were morphologically identical. Estrogen receptor was 95% positive. Progesterone receptor was 1% "positive". The proliferation marker was 5%. There was no HER-2 amplification, with a signals ratio of 1.52, the number per cell being 2.05.  On 05/08/2015 the patient underwent bilateral breast MRIs. In the right breast only post biopsy changes were seen. In the left breast there was a 3.27 m area of ill-defined enhancement and a second area measuring 1.3 cm, the whole measuring up to 4.0 cm at least when added together. There was a level I left axillary lymph node with minimal cortical thickening of uncertain significance.  The patient's subsequent history is as detailed below  INTERVAL HISTORY: Courtney Shea returns today for follow-up and treatment of her estrogen receptor positive breast cancer. She continues on anastrozole, which she is tolerating well.  She has intermittent tolerable hot flashes.  She denies any other effects such as arthralgias or vaginal dryness.    REVIEW OF SYSTEMS: Courtney Shea is not exercising as much as she should.  She tells me that she enjoys walking, but can't walk alone because her balance is off.  She says that some days are worse than others.  This has been going on for a couple of years.  She does have a h/o TIA/CVA. This was 5 years ago.  She did undergo CT head w/o contrast earlier this year to evaluate her head s/p fall, and it was negative.  She sees her PCP regularly.  She sees Dr. Ronnald Ramp at [facility] regularly for skin screenings.    Otherwise Courtney Shea is doing  well.  She denies any unusual headaches or vision changes.  She is without nausea, vomiting, bowel/bladder changes.  She denies any chest pain, palpitations, focal weakness, sensory changes, shortness of breath or cough.  A detailed ROS was non contributory.    PAST MEDICAL HISTORY: Past Medical History:  Diagnosis Date  . Breast cancer ([place])   . Breast cancer of lower-outer quadrant of right female breast ([place]) 05/07/2015  . GERD (gastroesophageal reflux disease)   . Hypertension   . Hypothyroid   . Personal history of radiation therapy   . Stroke [facility])    TIA, no deficits, placed on Plavix  . Syncope   . Ventricular bigeminy     PAST SURGICAL HISTORY: Past Surgical History:  Procedure Laterality Date  . ABDOMINAL HYSTERECTOMY    . BLADDER SUSPENSION    . BREAST LUMPECTOMY Right    2016  . CATARACT EXTRACTION    . INCONTINENCE SURGERY    . Ovarian cyst removed    . PARTIAL MASTECTOMY WITH NEEDLE LOCALIZATION Bilateral 05/29/2015   Procedure: LEFT BREAST PARTIAL MASTECTOMY WITH NEEDLE LOCALIZATION TIMES 2 AND RIGHT BREAST PARTIAL MASTECTOMY WITH NEEDLE LOCALIZATION;  Surgeon: Erroll Luna, MD;  Location: [facility];  Service: General;  Laterality: Bilateral;  . RE-EXCISION OF BREAST LUMPECTOMY Left 06/21/2015   Procedure: LEFT BREAST RE EXCISION OF LUMPECTOMY;  Surgeon: Erroll Luna, MD;  Location: [facility];  Service: General;  Laterality: Left;    FAMILY HISTORY Family History  Problem Relation Age of Onset  . Coronary artery disease Mother        MI at age 58  . Breast cancer Sister 64  . Kidney disease Brother   . Cancer Sister        colon cancer suggested   the patient's father died at age 46, the patient's mother at age 31, both from strokes. The patient had one brother, 2 sisters. One sister was diagnosed with breast cancer at age 92. There is no history of ovarian cancer in the family.  GYNECOLOGIC HISTORY:  No LMP recorded. Patient  is postmenopausal. Menarche age 50, first live birth age 69. The patient is GX P2. She underwent hysterectomy without salpingo-oophorectomy remotely, but then had bilateral salpingo-oophorectomy subsequently for removal ovarian cysts. She took hormone replacement for about 20 years, stopping in 2014.  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 Her husband [name] is a former Chief Financial Officer. Daughter [name] numb works for the [employer], orienting at risk students. Son Rodman Key lives in [place] where he does research and development for [employer]. The patient has 3 grandchildren. She attends the [employer] (her son-in-law is Theme park manager.    ADVANCED DIRECTIVES: In place   HEALTH MAINTENANCE: Social History   Tobacco Use  . Smoking status: Never Smoker  . Smokeless tobacco: Never Used  Substance Use Topics  . Alcohol use: No  . Drug use: No     Colonoscopy: 2011/[facility]  PAP: 2016  Bone density: 2001/normal  Lipid panel:  Allergies  Allergen Reactions  . Macrodantin [Nitrofurantoin Macrocrystal] Nausea Only    "extreme nausea"  . Codeine Nausea And Vomiting  . Crestor [Rosuvastatin Calcium] Other (See Comments)    "aching in legs"  . Doxycycline Itching and Rash  . Hydrochlorothiazide Rash    "  all over body rash"  . Norvasc [Amlodipine Besylate] Swelling    "swelling in ankles"  . Sulfa Antibiotics Nausea And Vomiting    Current Outpatient Medications  Medication Sig Dispense Refill  . anastrozole (ARIMIDEX) 1 MG tablet Take 1 tablet (1 mg total) by mouth daily. 1 tablet 12  . aspirin EC 81 MG tablet Take 81 mg by mouth once.     . Calcium Carbonate-Vitamin D (CALCIUM 600 + D PO) Take 1 tablet by mouth every evening.     . clobetasol (TEMOVATE) 0.05 % external solution APPLY TO SCALP EVERY DAY  2  . cloNIDine (CATAPRES) 0.2 MG tablet Take 0.2 mg by mouth at bedtime.  11  . clopidogrel (PLAVIX) 75 MG tablet Take 75  mg by mouth daily.    . Cranberry 500 MG CAPS Take 500 mg by mouth daily.    . furosemide (LASIX) 20 MG tablet Take 20 mg by mouth daily.     . hydrALAZINE (APRESOLINE) 25 MG tablet Take 25 mg by mouth 2 (two) times daily with a meal.  3  . levothyroxine (SYNTHROID, LEVOTHROID) 88 MCG tablet Take 88 mcg by mouth daily.    Marland Kitchen losartan (COZAAR) 100 MG tablet Take 100 mg by mouth daily.    . metoprolol succinate (TOPROL-XL) 50 MG 24 hr tablet Take 50 mg by mouth daily. Take with or immediately following a meal.    . pantoprazole (PROTONIX) 40 MG tablet Take 40 mg by mouth every evening.    . sennosides-docusate sodium (SENOKOT-S) 8.6-50 MG tablet Take 1 tablet by mouth daily as needed for constipation.      No current facility-administered medications for this visit.     OBJECTIVE:   Vitals:   08/05/18 1302  BP: (!) 170/87  Pulse: 62  Resp: 18  Temp: 97.7 F (36.5 C)  SpO2: 100%     Body mass index is 27.86 kg/m.    ECOG FS:1 - Symptomatic but completely ambulatory GENERAL: Patient is a well appearing female in no acute distress HEENT:  Sclerae anicteric.  Oropharynx clear and moist. No ulcerations or evidence of oropharyngeal candidiasis. Neck is supple.  NODES:  No cervical, supraclavicular, or axillary lymphadenopathy palpated.  BREAST EXAM:  Left breast s/p lumpectomy no sign of local recurrence, right breast s/p lumpectomy no sign of local recurrence LUNGS:  Clear to auscultation bilaterally.  No wheezes or rhonchi. HEART:  Regular rate and rhythm. No murmur appreciated. ABDOMEN:  Soft, nontender.  Positive, normoactive bowel sounds. No organomegaly palpated. MSK:  No focal spinal tenderness to palpation. Full range of motion bilaterally in the upper extremities. EXTREMITIES:  No peripheral edema.   SKIN:  Clear with no obvious rashes or skin changes. No nail dyscrasia. NEURO:  Nonfocal. Well oriented.  Appropriate affect.   LAB RESULTS:  CMP     Component Value Date/Time     NA 141 08/05/2018 1200   NA 141 07/14/2017 1239   K 4.4 08/05/2018 1200   K 4.5 07/14/2017 1239   CL 104 08/05/2018 1200   CO2 26 08/05/2018 1200   CO2 27 07/14/2017 1239   GLUCOSE 95 08/05/2018 1200   GLUCOSE 97 07/14/2017 1239   BUN 16 08/05/2018 1200   BUN 20.0 07/14/2017 1239   CREATININE 0.86 08/05/2018 1200   CREATININE 0.8 07/14/2017 1239   CALCIUM 9.9 08/05/2018 1200   CALCIUM 10.0 07/14/2017 1239   PROT 7.6 08/05/2018 1200   PROT 7.4 07/14/2017 1239   ALBUMIN 4.3  08/05/2018 1200   ALBUMIN 4.2 07/14/2017 1239   AST 17 08/05/2018 1200   AST 16 07/14/2017 1239   ALT 16 08/05/2018 1200   ALT 15 07/14/2017 1239   ALKPHOS 84 08/05/2018 1200   ALKPHOS 77 07/14/2017 1239   BILITOT 0.4 08/05/2018 1200   BILITOT 0.43 07/14/2017 1239   GFRNONAA 60 (L) 08/05/2018 1200   GFRAA >60 08/05/2018 1200    INo results found for: SPEP, UPEP  Lab Results  Component Value Date   WBC 8.5 08/05/2018   NEUTROABS 5.9 08/05/2018   HGB 12.6 08/05/2018   HCT 37.8 08/05/2018   MCV 90.6 08/05/2018   PLT 243 08/05/2018      Chemistry      Component Value Date/Time   NA 141 08/05/2018 1200   NA 141 07/14/2017 1239   K 4.4 08/05/2018 1200   K 4.5 07/14/2017 1239   CL 104 08/05/2018 1200   CO2 26 08/05/2018 1200   CO2 27 07/14/2017 1239   BUN 16 08/05/2018 1200   BUN 20.0 07/14/2017 1239   CREATININE 0.86 08/05/2018 1200   CREATININE 0.8 07/14/2017 1239      Component Value Date/Time   CALCIUM 9.9 08/05/2018 1200   CALCIUM 10.0 07/14/2017 1239   ALKPHOS 84 08/05/2018 1200   ALKPHOS 77 07/14/2017 1239   AST 17 08/05/2018 1200   AST 16 07/14/2017 1239   ALT 16 08/05/2018 1200   ALT 15 07/14/2017 1239   BILITOT 0.4 08/05/2018 1200   BILITOT 0.43 07/14/2017 1239       No results found for: LABCA2  No components found for: LABCA125  No results for input(s): INR in the last 168 hours.  Urinalysis    Component Value Date/Time   COLORURINE YELLOW 06/20/2013 0910    APPEARANCEUR CLEAR 06/20/2013 0910   LABSPEC 1.004 (L) 06/20/2013 0910   PHURINE 6.5 06/20/2013 0910   GLUCOSEU NEGATIVE 06/20/2013 0910   HGBUR NEGATIVE 06/20/2013 0910   BILIRUBINUR NEGATIVE 06/20/2013 0910   KETONESUR NEGATIVE 06/20/2013 0910   PROTEINUR NEGATIVE 06/20/2013 0910   UROBILINOGEN 0.2 06/20/2013 0910   NITRITE NEGATIVE 06/20/2013 0910   LEUKOCYTESUR TRACE (A) 06/20/2013 0910    STUDIES:      ASSESSMENT: 84 y.o. Courtney Shea woman status post left breast biopsy 05/03/2015 of 2 separate masses, clinically T2 N0, stage IIA invasive lobular breast cancer, grade 2, estrogen receptor positive, progesterone receptor 1% "positive", HER-2 not amplified  (a) right breast biopsy 04/27/2015 shows atypical lobular hyperplasia/complex sclerotic lesion  (1) status post left lumpectomy on 05/29/2015 for a pT3, NX, stage II invasive lobular breast cancer, grade 2, estrogen receptor positive, HER-2 negative, with closebut negative margins  (a) reexcision of left lumpectomy on 06/21/2015 demonstrated several areas of fatty necrosis and atypical lobular hyperplasia  (2) radiation10/01/2015-09/03/2015: Left Breast. 50 Gy in 25 fractions.   (3) anastrozole started 10/25/15  (4) osteopenia:   (a) T-score -1.5 on 09/04/2015  (b) T score of -1.6 on 09/16/2017  PLAN:  Courtney Shea is doing well today.  She is without any clinical or radiographic sign of recurrence.  She is taking Anastrozole and tolerating it well and will continue this.  I reviewed her mammogram and her bone density today which looked good.  She will be due for repeat mammogram in 08/2018 and I ordered this today.  She will be due for repeat bone density in 2020.  I reviewed bone health in detail and gave her detailed information about calcium, vitamin d and  weight bearing exercises in her AVS.    I am slightly concerned about this balance issue.  I asked her to f/u with her PCP about this to see if she would benefit from any  imaging, PT, or other evaluation/management considering her history.  She told me that she has many friends with this issue, and she thinks it is related to age and she doesn't want any evaluation of it.    Nyia will return in one year for labs and f/u with Dr. Jana Hakim.  She knows to call for any questions or concerns prior to her next appointment with [place].    A total of (30) minutes of face-to-face time was spent with this patient with greater than 50% of that time in counseling and care-coordination.   Wilber Bihari, NP  08/05/18 1:03 PM Medical Oncology and Hematology [facility] [address] [place], [place] [address] Tel. [phone]    Fax. [phone]

## 2018-08-05 NOTE — Telephone Encounter (Signed)
Gave patient avs and calendar.   °

## 2018-09-17 ENCOUNTER — Ambulatory Visit
Admission: RE | Admit: 2018-09-17 | Discharge: 2018-09-17 | Disposition: A | Discharge status: Home / Self Care | Payer: Medicare Other | Source: Ambulatory Visit | Attending: Adult Health | Admitting: Adult Health

## 2018-09-17 DIAGNOSIS — Z170 Estrogen receptor positive status [ER+]: Principal | ICD-10-CM

## 2018-09-17 DIAGNOSIS — C50212 Malignant neoplasm of upper-inner quadrant of left female breast: Secondary | ICD-10-CM

## 2019-01-05 ENCOUNTER — Other Ambulatory Visit: Payer: Self-pay

## 2019-01-05 ENCOUNTER — Emergency Department (HOSPITAL_COMMUNITY)
Admission: EM | Admit: 2019-01-05 | Discharge: 2019-01-05 | Disposition: A | Discharge status: Home / Self Care | Payer: Medicare Other | Attending: Emergency Medicine | Admitting: Emergency Medicine

## 2019-01-05 DIAGNOSIS — Y9222 Religious institution as the place of occurrence of the external cause: Secondary | ICD-10-CM | POA: Diagnosis not present

## 2019-01-05 DIAGNOSIS — Y9301 Activity, walking, marching and hiking: Secondary | ICD-10-CM | POA: Diagnosis not present

## 2019-01-05 DIAGNOSIS — S42202A Unspecified fracture of upper end of left humerus, initial encounter for closed fracture: Secondary | ICD-10-CM | POA: Diagnosis not present

## 2019-01-05 DIAGNOSIS — S4992XA Unspecified injury of left shoulder and upper arm, initial encounter: Secondary | ICD-10-CM | POA: Diagnosis present

## 2019-01-05 DIAGNOSIS — Z79899 Other long term (current) drug therapy: Secondary | ICD-10-CM | POA: Insufficient documentation

## 2019-01-05 DIAGNOSIS — I10 Essential (primary) hypertension: Secondary | ICD-10-CM | POA: Insufficient documentation

## 2019-01-05 DIAGNOSIS — Z7982 Long term (current) use of aspirin: Secondary | ICD-10-CM | POA: Diagnosis not present

## 2019-01-05 DIAGNOSIS — Y999 Unspecified external cause status: Secondary | ICD-10-CM | POA: Insufficient documentation

## 2019-01-05 DIAGNOSIS — E039 Hypothyroidism, unspecified: Secondary | ICD-10-CM | POA: Insufficient documentation

## 2019-01-05 DIAGNOSIS — W51XXXA Accidental striking against or bumped into by another person, initial encounter: Secondary | ICD-10-CM | POA: Insufficient documentation

## 2019-01-05 DIAGNOSIS — W19XXXA Unspecified fall, initial encounter: Secondary | ICD-10-CM

## 2019-01-05 MED ORDER — HYDROCODONE-ACETAMINOPHEN 5-325 MG PO TABS
1.0000 | ORAL_TABLET | Freq: Once | ORAL | Status: AC
  Administered 2019-01-05: 1 via ORAL
  Filled 2019-01-05: qty 1

## 2019-01-05 MED ORDER — HYDROCODONE-ACETAMINOPHEN 5-325 MG PO TABS: 1.0000 | ORAL_TABLET | ORAL | 0 refills | Status: DC | PRN

## 2019-01-05 NOTE — ED Notes (Signed)
Bed: WA20 Expected date:  Expected time:  Means of arrival:  Comments: From [facility] walk in-85 yo female with humerus fx on blood thinners

## 2019-01-05 NOTE — ED Triage Notes (Signed)
Patient arrives by POV with her family from [facility] in [place] states she was at church at ~1930 and was coming through a door and a man was coming out the door, states she has been having balance issues and she fell on her left shoulder. X-rays at [facility] walk in showed left humerus fracture-patient is on blood thinners-also states she is due her BP meds for the evening too. Patient has good PMS left radial-also has a skin tear/blood blister lateral left elbow area that has been dressed at [facility] clinic-shoulder immobilizer in place left arm-patient states no pain unless moving arm-then pain is 10/10. Family at bedside.

## 2019-01-05 NOTE — ED Provider Notes (Signed)
[facility] DEPT Provider Note   CSN: [id number] Arrival date & time: 01/05/19  2100    History   Chief Complaint Chief Complaint  Patient presents with  . Fall  . Left arm injury    HPI Courtney Shea is a 85 y.o. female who presents with a fall and L shoulder pain. PMH significant for chronic gait instability, hx of breast cancer, HTN, GERD, hx of CVA on plavix. She states that she was at church and a man was going by her and it caused her to lose her balance and she fell on to her left side. She had immediate onset of pain in her L shoulder. She denies LOC, head injury, neck pain, chest pain, SOB, abdominal pain, hip pain. She went to [facility] and had an xray of the shoulder which showed a proximal humerus fracture. She was sent to the ED for further evaluation. Addisonally she has a skin tear over the left forearm. She has not had anything for pain. She does not have a orthopedics provider.   HPI  Past Medical History:  Diagnosis Date  . Breast cancer ([place])   . Breast cancer of lower-outer quadrant of right female breast ([place]) 05/07/2015  . GERD (gastroesophageal reflux disease)   . Hypertension   . Hypothyroid   . Personal history of radiation therapy   . Stroke [facility])    TIA, no deficits, placed on Plavix  . Syncope   . Ventricular bigeminy     Patient Active Problem List   Diagnosis Date Noted  . Malignant neoplasm of upper-inner quadrant of left breast in female, estrogen receptor positive ([place]) 07/14/2017  . Osteopenia 11/07/2015  . Carcinoma of upper-inner quadrant of left female breast ([place]) 07/03/2015  . TIA (transient ischemic attack) 06/20/2013  . Hypokalemia 06/20/2013  . Ventricular bigeminy 12/05/2011  . Leukocytosis 12/05/2011  . Syncope 12/04/2011  . Hypothyroidism 12/04/2011  . HTN (hypertension) 12/04/2011  . UTI (lower urinary tract infection) 12/04/2011  . PAIN IN LIMB 04/02/2007    Past Surgical  History:  Procedure Laterality Date  . ABDOMINAL HYSTERECTOMY    . BLADDER SUSPENSION    . BREAST LUMPECTOMY Right    2016  . CATARACT EXTRACTION    . INCONTINENCE SURGERY    . Ovarian cyst removed    . PARTIAL MASTECTOMY WITH NEEDLE LOCALIZATION Bilateral 05/29/2015   Procedure: LEFT BREAST PARTIAL MASTECTOMY WITH NEEDLE LOCALIZATION TIMES 2 AND RIGHT BREAST PARTIAL MASTECTOMY WITH NEEDLE LOCALIZATION;  Surgeon: Erroll Luna, MD;  Location: [facility];  Service: General;  Laterality: Bilateral;  . RE-EXCISION OF BREAST LUMPECTOMY Left 06/21/2015   Procedure: LEFT BREAST RE EXCISION OF LUMPECTOMY;  Surgeon: Erroll Luna, MD;  Location: [facility];  Service: General;  Laterality: Left;     OB History   No obstetric history on file.      Home Medications    Prior to Admission medications   Medication Sig Start Date End Date Taking? Authorizing Provider  anastrozole (ARIMIDEX) 1 MG tablet Take 1 tablet (1 mg total) by mouth daily. 12/01/17   Magrinat, Virgie Dad, MD  aspirin EC 81 MG tablet Take 81 mg by mouth once.     [provider]  Calcium Carbonate-Vitamin D (CALCIUM 600 + D PO) Take 1 tablet by mouth every evening.     [provider]  clobetasol (TEMOVATE) 0.05 % external solution APPLY TO SCALP EVERY DAY 06/28/15   [provider]  cloNIDine (CATAPRES) 0.2 MG tablet Take 0.2 mg by mouth at bedtime. 04/27/15   [provider]  clopidogrel (PLAVIX) 75 MG tablet Take 75 mg by mouth daily.    [provider]  Cranberry 500 MG CAPS Take 500 mg by mouth daily.    [provider]  furosemide (LASIX) 20 MG tablet Take 20 mg by mouth daily.     [provider]  hydrALAZINE (APRESOLINE) 25 MG tablet Take 25 mg by mouth 2 (two) times daily with a meal. 05/29/18   [provider]  levothyroxine (SYNTHROID, LEVOTHROID) 88 MCG tablet Take 88 mcg by mouth daily.    [provider]  losartan  (COZAAR) 100 MG tablet Take 100 mg by mouth daily.    [provider]  metoprolol succinate (TOPROL-XL) 50 MG 24 hr tablet Take 50 mg by mouth daily. Take with or immediately following a meal.    [provider]  pantoprazole (PROTONIX) 40 MG tablet Take 40 mg by mouth every evening.    [provider]  sennosides-docusate sodium (SENOKOT-S) 8.6-50 MG tablet Take 1 tablet by mouth daily as needed for constipation.     [provider]    Family History Family History  Problem Relation Age of Onset  . Coronary artery disease Mother        MI at age 40  . Breast cancer Sister 20  . Kidney disease Brother   . Cancer Sister        colon cancer suggested    Social History Social History   Tobacco Use  . Smoking status: Never Smoker  . Smokeless tobacco: Never Used  Substance Use Topics  . Alcohol use: No  . Drug use: No     Allergies   Macrodantin [nitrofurantoin macrocrystal]; Codeine; Crestor [rosuvastatin calcium]; Doxycycline; Hydrochlorothiazide; Norvasc [amlodipine besylate]; and Sulfa antibiotics   Review of Systems Review of Systems  Respiratory: Negative for shortness of breath.   Cardiovascular: Negative for chest pain.  Gastrointestinal: Negative for abdominal pain.  Musculoskeletal: Positive for arthralgias and gait problem.  Neurological: Negative for dizziness, weakness, light-headedness and headaches.  Hematological: Bruises/bleeds easily.  All other systems reviewed and are negative.    Physical Exam Updated Vital Signs BP (!) 148/104 (BP Location: Right Arm)   Pulse 80   Temp 98.8 F (37.1 C) (Oral)   Resp 18   SpO2 100%   Physical Exam Vitals signs and nursing note reviewed.  Constitutional:      General: She is not in acute distress.    Appearance: Normal appearance. She is well-developed. She is not ill-appearing.     Comments: Calm and cooperative. In pain from shoulder  HENT:     Head: Normocephalic and  atraumatic.  Eyes:     General: No scleral icterus.       Right eye: No discharge.        Left eye: No discharge.     Conjunctiva/sclera: Conjunctivae normal.     Pupils: Pupils are equal, round, and reactive to light.  Neck:     Musculoskeletal: Normal range of motion and neck supple.  Cardiovascular:     Rate and Rhythm: Normal rate and regular rhythm.  Pulmonary:     Effort: Pulmonary effort is normal. No respiratory distress.     Breath sounds: Normal breath sounds.  Abdominal:     General: There is no distension.     Palpations: Abdomen is soft.  Tenderness: There is no abdominal tenderness.  Musculoskeletal:     Comments: Left upper extremity: Swelling over L shoulder. No skin tenting. Tenderness over proximal humerus. No tenderness of elbow. ~1cm skin tear over the L forearm with hematoma. 2+ radial pulse  Skin:    General: Skin is warm and dry.  Neurological:     Mental Status: She is alert and oriented to person, place, and time.  Psychiatric:        Behavior: Behavior normal.      ED Treatments / Results  Labs (all labs ordered are listed, but only abnormal results are displayed) Labs Reviewed - No data to display  EKG None  Radiology No results found.  Procedures Procedures (including critical care time)  Medications Ordered in ED Medications  HYDROcodone-acetaminophen (NORCO/VICODIN) 5-325 MG per tablet 1 tablet (1 tablet Oral Given 01/05/19 2132)     Initial Impression / Assessment and Plan / ED Course  I have reviewed the triage vital signs and the nursing notes.  Pertinent labs & imaging results that were available during my care of the patient were reviewed by me and considered in my medical decision making (see chart for details).  85 year old presents with L humerus fracture diagnosed by outside facility. She is hypertensive but otherwise vitals are normal. She has chronic gait instability which was the cause of her fall. She has a 2+ radial  pulse. No other pain complaints. Shared visit with Dr. Gilford Raid. Will place in shoulder immobilizer. Pain was treated here and she was given rx for pain medicine and referral to orthopedics.  Final Clinical Impressions(s) / ED Diagnoses   Final diagnoses:  Fall, initial encounter  Closed fracture of proximal end of left humerus, unspecified fracture morphology, initial encounter    ED Discharge Orders    None       Recardo Evangelist, PA-C 01/05/19 2315    Isla Pence, MD 01/05/19 [phone]

## 2019-01-05 NOTE — Discharge Instructions (Addendum)
Take Tylenol as needed for pain Take Norco for severe pain as needed Make an appointment with orthopedics to follow up  Return if you are worsening

## 2019-02-26 ENCOUNTER — Other Ambulatory Visit: Payer: Self-pay | Admitting: Oncology

## 2019-02-26 DIAGNOSIS — Z170 Estrogen receptor positive status [ER+]: Principal | ICD-10-CM

## 2019-02-26 DIAGNOSIS — C50511 Malignant neoplasm of lower-outer quadrant of right female breast: Secondary | ICD-10-CM

## 2019-08-09 ENCOUNTER — Other Ambulatory Visit: Payer: Self-pay | Admitting: *Deleted

## 2019-08-09 DIAGNOSIS — C50212 Malignant neoplasm of upper-inner quadrant of left female breast: Secondary | ICD-10-CM

## 2019-08-10 ENCOUNTER — Other Ambulatory Visit: Payer: Self-pay

## 2019-08-10 ENCOUNTER — Inpatient Hospital Stay: Payer: Medicare Other | Admitting: Oncology

## 2019-08-10 ENCOUNTER — Inpatient Hospital Stay: Payer: Medicare Other | Attending: Oncology

## 2019-08-10 VITALS — BP 135/72 | HR 68 | Temp 99.1°F | Resp 18 | Wt 159.3 lb

## 2019-08-10 DIAGNOSIS — Z170 Estrogen receptor positive status [ER+]: Secondary | ICD-10-CM | POA: Diagnosis not present

## 2019-08-10 DIAGNOSIS — N951 Menopausal and female climacteric states: Secondary | ICD-10-CM | POA: Insufficient documentation

## 2019-08-10 DIAGNOSIS — M8580 Other specified disorders of bone density and structure, unspecified site: Secondary | ICD-10-CM | POA: Diagnosis not present

## 2019-08-10 DIAGNOSIS — C50212 Malignant neoplasm of upper-inner quadrant of left female breast: Secondary | ICD-10-CM | POA: Diagnosis not present

## 2019-08-10 LAB — CMP (CANCER CENTER ONLY)
ALT: 13 U/L (ref 0–44)
AST: 15 U/L (ref 15–41)
Albumin: 4.2 g/dL (ref 3.5–5.0)
Alkaline Phosphatase: 91 U/L (ref 38–126)
Anion gap: 10 (ref 5–15)
BUN: 23 mg/dL (ref 8–23)
CO2: 26 mmol/L (ref 22–32)
Calcium: 9.6 mg/dL (ref 8.9–10.3)
Chloride: 103 mmol/L (ref 98–111)
Creatinine: 0.87 mg/dL (ref 0.44–1.00)
GFR, Est AFR Am: >60 mL/min (ref >60)
GFR, Estimated: >60 mL/min (ref >60)
Glucose, Bld: 98 mg/dL (ref 70–99)
Potassium: 4.2 mmol/L (ref 3.5–5.1)
Sodium: 139 mmol/L (ref 135–145)
Total Bilirubin: 0.4 mg/dL (ref 0.3–1.2)
Total Protein: 7.2 g/dL (ref 6.5–8.1)

## 2019-08-10 LAB — CBC WITH DIFFERENTIAL (CANCER CENTER ONLY)
Abs Immature Granulocytes: 0.03 10*3/uL (ref 0.00–0.07)
Basophils Absolute: 0 10*3/uL (ref 0.0–0.1)
Basophils Relative: 0 %
Eosinophils Absolute: 0.1 10*3/uL (ref 0.0–0.5)
Eosinophils Relative: 1 %
HCT: 36.7 % (ref 36.0–46.0)
Hemoglobin: 12 g/dL (ref 12.0–15.0)
Immature Granulocytes: 0 %
Lymphocytes Relative: 17 %
Lymphs Abs: 1.6 10*3/uL (ref 0.7–4.0)
MCH: 29.5 pg (ref 26.0–34.0)
MCHC: 32.7 g/dL (ref 30.0–36.0)
MCV: 90.2 fL (ref 80.0–100.0)
Monocytes Absolute: 0.8 10*3/uL (ref 0.1–1.0)
Monocytes Relative: 8 %
Neutro Abs: 7 10*3/uL (ref 1.7–7.7)
Neutrophils Relative %: 74 %
Platelet Count: 286 10*3/uL (ref 150–400)
RBC: 4.07 MIL/uL (ref 3.87–5.11)
RDW: 12.8 % (ref 11.5–15.5)
WBC Count: 9.5 10*3/uL (ref 4.0–10.5)
nRBC: 0 % (ref 0.0–0.2)

## 2019-08-10 NOTE — Progress Notes (Signed)
Courtney Shea  [phone] Fax:([phone]) [phone]     ID: LATRISA HELLUMS DOB: 08-06-1930  MR#: [id number]  DJS#:[id number]  Patient Care Team: Josetta Huddle, MD as PCP - General (Internal Medicine) Sherae Santino, Virgie Dad, MD as Consulting Physician (Oncology) Eppie Gibson, MD as Attending Physician (Radiation Oncology) Erroll Luna, MD as Consulting Physician (General Surgery) Mauro Kaufmann, RN as Registered Nurse Rockwell Germany, RN as Registered Nurse Holley Bouche, NP (Inactive) as Nurse Practitioner (Nurse Practitioner) Sylvan Cheese, NP as Nurse Practitioner (Hematology and Oncology) PCP: Josetta Huddle, MD GYN: Dory Horn MD OTHER MD: Kirk Ruths MD, Wilhemina Bonito MD  CHIEF COMPLAINT: Estrogen receptor positive breast cancer  CURRENT TREATMENT: anastrozole  BREAST CANCER HISTORY: From the original intake note:  The patient has had multiple diagnostic mammographies and ultrasonography his dating back to September 2013 following 2 areas in the left breast, one of the 2:00 position, the other at the 3:00 position. These were felt to be most likely fibroadenomatous. Repeat left ultrasonography 02/04/2013 showed them to be stable. On 04/11/2014 bilateral diagnostic mammography with ultrasonography found nothing on mammography, and by ultrasound the 2 masses previously described were again seen and were again unchanged.  On 04/16/2015, with bilateral diagnostic mammography with tomosynthesis and bilateral breast ultrasonography at the breast Center, the patient's breast density was read as category D. In the right breast there was an area of distortion in the lower outer quadrant. This was not palpable. By ultrasound there was a vague area of distortion measuring 0.9 cm. The right axilla was negative.  In the left breast upper inner quadrant there was an area of possible distortion with no discrete mass. The previously noted fibroadenomas were  again seen.  On 04/27/2015 the patient underwent stereotactic biopsy of the right lower outer quadrant area of distortion. This showed ([id number]) atypical lobular hyperplasia including a radial scar. On 05/03/2015 the patient underwent biopsy of two adjacent areas of architectural distortion in the upper inner left breast. This showed ([id number]) invasive lobular breast cancer in both biopsies, which were morphologically identical. Estrogen receptor was 95% positive. Progesterone receptor was 1% "positive". The proliferation marker was 5%. There was no HER-2 amplification, with a signals ratio of 1.52, the number per cell being 2.05.  On 05/08/2015 the patient underwent bilateral breast MRIs. In the right breast only post biopsy changes were seen. In the left breast there was a 3.27 m area of ill-defined enhancement and a second area measuring 1.3 cm, the whole measuring up to 4.0 cm at least when added together. There was a level I left axillary lymph node with minimal cortical thickening of uncertain significance.  The patient's subsequent history is as detailed below   INTERVAL HISTORY: Courtney Shea returns today for follow-up and treatment of her estrogen receptor positive breast cancer. She was last seen here on 08/05/2018.   She continues on anastrozole.  She tolerates this well, with minimal hot flashes and no significant issues with vaginal dryness.  Courtney Shea's last bone density screening on 09/16/2017, showed a T-score of -1.6, which is considered osteopenic.    Since her last visit here, she underwent a digital diagnostic bilateral mammogram with tomography on 09/17/2018 showing: Breast Density Category C. There is no mammographic evidence of malignancy.   REVIEW OF SYSTEMS: [name] fell in March and broke her left arm.  Since that time she has been extra careful, using her cane or walker at home, and when she goes uses her cane  and hangs onto her husband who is older than she is but does  not have a balance problem.  It is difficult to tell whether this is vertigo or not.  She thinks it may have an element of vertigo when it although from her description it sounds more like a balance problem.  She is taking appropriate pandemic precautions.  She is avoiding vulgar and unpleasant TV and watching a lot of old westerns especially gun smoke.  A detailed review of systems today was otherwise noncontributory    PAST MEDICAL HISTORY: Past Medical History:  Diagnosis Date  . Breast cancer ([place])   . Breast cancer of lower-outer quadrant of right female breast ([place]) 05/07/2015  . GERD (gastroesophageal reflux disease)   . Hypertension   . Hypothyroid   . Personal history of radiation therapy   . Stroke [facility])    TIA, no deficits, placed on Plavix  . Syncope   . Ventricular bigeminy     PAST SURGICAL HISTORY: Past Surgical History:  Procedure Laterality Date  . ABDOMINAL HYSTERECTOMY    . BLADDER SUSPENSION    . BREAST LUMPECTOMY Right    2016  . CATARACT EXTRACTION    . INCONTINENCE SURGERY    . Ovarian cyst removed    . PARTIAL MASTECTOMY WITH NEEDLE LOCALIZATION Bilateral 05/29/2015   Procedure: LEFT BREAST PARTIAL MASTECTOMY WITH NEEDLE LOCALIZATION TIMES 2 AND RIGHT BREAST PARTIAL MASTECTOMY WITH NEEDLE LOCALIZATION;  Surgeon: Erroll Luna, MD;  Location: [facility];  Service: General;  Laterality: Bilateral;  . RE-EXCISION OF BREAST LUMPECTOMY Left 06/21/2015   Procedure: LEFT BREAST RE EXCISION OF LUMPECTOMY;  Surgeon: Erroll Luna, MD;  Location: [facility];  Service: General;  Laterality: Left;    FAMILY HISTORY Family History  Problem Relation Age of Onset  . Coronary artery disease Mother        MI at age 88  . Breast cancer Sister 60  . Kidney disease Brother   . Cancer Sister        colon cancer suggested   the patient's father died at age 71, the patient's mother at age 54, both from strokes. The patient had one brother, 2  sisters. One sister was diagnosed with breast cancer at age 56. There is no history of ovarian cancer in the family.  GYNECOLOGIC HISTORY:  No LMP recorded. Patient has had a hysterectomy. Menarche age 8, first live birth age 7. The patient is GX P2. She underwent hysterectomy without salpingo-oophorectomy remotely, but then had bilateral salpingo-oophorectomy subsequently for removal ovarian cysts. She took hormone replacement for about 20 years, stopping in 2014.  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 Her husband [name] is a former Chief Financial Officer. Daughter [name] numb works for the [employer], orienting at risk students. Son Rodman Key lives in [place] where he does research and development for [employer]. The patient has 3 grandchildren. She attends the [employer] (her son-in-law is Theme park manager).    ADVANCED DIRECTIVES: In place   HEALTH MAINTENANCE: Social History   Tobacco Use  . Smoking status: Never Smoker  . Smokeless tobacco: Never Used  Substance Use Topics  . Alcohol use: No  . Drug use: No     Colonoscopy: 2011/[facility]  PAP: 2016  Bone density: 2001/normal  Lipid panel:  Allergies  Allergen Reactions  . Codeine Nausea And Vomiting  . Macrodantin [Nitrofurantoin Macrocrystal] Nausea Only    "extreme nausea"  .  Crestor [Rosuvastatin Calcium] Other (See Comments)    "aching in legs"  . Doxycycline Itching and Rash  . Hydrochlorothiazide Rash    "all over body rash"  . Norvasc [Amlodipine Besylate] Swelling    "swelling in ankles"  . Sulfa Antibiotics Nausea And Vomiting    Current Outpatient Medications  Medication Sig Dispense Refill  . anastrozole (ARIMIDEX) 1 MG tablet TAKE 1 TABLET BY MOUTH EVERY DAY 90 tablet 4  . aspirin EC 81 MG tablet Take 81 mg by mouth once.     . Calcium Carbonate-Vitamin D (CALCIUM 600 + D PO) Take 1 tablet by mouth every evening.     . clobetasol (TEMOVATE) 0.05 %  external solution Apply 1 application topically 2 (two) times daily.   2  . cloNIDine (CATAPRES) 0.2 MG tablet Take 0.2 mg by mouth at bedtime.  11  . clopidogrel (PLAVIX) 75 MG tablet Take 75 mg by mouth daily.    . Cranberry 500 MG CAPS Take 500 mg by mouth daily.    . furosemide (LASIX) 20 MG tablet Take 20 mg by mouth daily.     . hydrALAZINE (APRESOLINE) 25 MG tablet Take 25 mg by mouth 2 (two) times daily with a meal.  3  . HYDROcodone-acetaminophen (NORCO/VICODIN) 5-325 MG tablet Take 1 tablet by mouth every 4 (four) hours as needed. 15 tablet 0  . levothyroxine (SYNTHROID, LEVOTHROID) 88 MCG tablet Take 88 mcg by mouth daily.    Marland Kitchen losartan (COZAAR) 100 MG tablet Take 100 mg by mouth daily.    . metoprolol succinate (TOPROL-XL) 50 MG 24 hr tablet Take 50 mg by mouth daily. Take with or immediately following a meal.    . pantoprazole (PROTONIX) 40 MG tablet Take 40 mg by mouth every evening.     No current facility-administered medications for this visit.     OBJECTIVE: Elderly white woman examined in a wheelchair  Vitals:   08/10/19 1101  BP: 135/72  Pulse: 68  Resp: 18  Temp: 99.1 F (37.3 C)  SpO2: 99%   Wt Readings from Last 3 Encounters:  08/10/19 159 lb 4.8 oz (72.3 kg)  01/05/19 162 lb (73.5 kg)  08/05/18 162 lb 4.8 oz (73.6 kg)   Body mass index is 26.92 kg/m.    ECOG FS:1 - Symptomatic but completely ambulatory  Ocular: Sclerae unicteric, pupils round and equal Ear-nose-throat: Wearing a mask Lymphatic: No cervical or supraclavicular adenopathy Lungs no rales or rhonchi Heart regular rate and rhythm Abd soft, nontender, positive bowel sounds MSK no focal spinal tenderness, no joint edema Neuro: non-focal, well-oriented, appropriate affect Breasts: The right breast is status post lumpectomy.  There is no evidence of local recurrence.  The left breast is status post lumpectomy.  There is no evidence of local recurrence.  Both axillae are benign.   LAB  RESULTS:  CMP     Component Value Date/Time   NA 139 08/10/2019 1047   NA 141 07/14/2017 1239   K 4.2 08/10/2019 1047   K 4.5 07/14/2017 1239   CL 103 08/10/2019 1047   CO2 26 08/10/2019 1047   CO2 27 07/14/2017 1239   GLUCOSE 98 08/10/2019 1047   GLUCOSE 97 07/14/2017 1239   BUN 23 08/10/2019 1047   BUN 20.0 07/14/2017 1239   CREATININE 0.87 08/10/2019 1047   CREATININE 0.8 07/14/2017 1239   CALCIUM 9.6 08/10/2019 1047   CALCIUM 10.0 07/14/2017 1239   PROT 7.2 08/10/2019 1047   PROT 7.4 07/14/2017 1239  ALBUMIN 4.2 08/10/2019 1047   ALBUMIN 4.2 07/14/2017 1239   AST 15 08/10/2019 1047   AST 16 07/14/2017 1239   ALT 13 08/10/2019 1047   ALT 15 07/14/2017 1239   ALKPHOS 91 08/10/2019 1047   ALKPHOS 77 07/14/2017 1239   BILITOT 0.4 08/10/2019 1047   BILITOT 0.43 07/14/2017 1239   GFRNONAA >60 08/10/2019 1047   GFRAA >60 08/10/2019 1047    INo results found for: SPEP, UPEP  Lab Results  Component Value Date   WBC 9.5 08/10/2019   NEUTROABS 7.0 08/10/2019   HGB 12.0 08/10/2019   HCT 36.7 08/10/2019   MCV 90.2 08/10/2019   PLT 286 08/10/2019      Chemistry      Component Value Date/Time   NA 139 08/10/2019 1047   NA 141 07/14/2017 1239   K 4.2 08/10/2019 1047   K 4.5 07/14/2017 1239   CL 103 08/10/2019 1047   CO2 26 08/10/2019 1047   CO2 27 07/14/2017 1239   BUN 23 08/10/2019 1047   BUN 20.0 07/14/2017 1239   CREATININE 0.87 08/10/2019 1047   CREATININE 0.8 07/14/2017 1239      Component Value Date/Time   CALCIUM 9.6 08/10/2019 1047   CALCIUM 10.0 07/14/2017 1239   ALKPHOS 91 08/10/2019 1047   ALKPHOS 77 07/14/2017 1239   AST 15 08/10/2019 1047   AST 16 07/14/2017 1239   ALT 13 08/10/2019 1047   ALT 15 07/14/2017 1239   BILITOT 0.4 08/10/2019 1047   BILITOT 0.43 07/14/2017 1239       No results found for: LABCA2  No components found for: LABCA125  No results for input(s): INR in the last 168 hours.  Urinalysis    Component Value  Date/Time   COLORURINE YELLOW 06/20/2013 0910   APPEARANCEUR CLEAR 06/20/2013 0910   LABSPEC 1.004 (L) 06/20/2013 0910   PHURINE 6.5 06/20/2013 0910   GLUCOSEU NEGATIVE 06/20/2013 0910   HGBUR NEGATIVE 06/20/2013 0910   BILIRUBINUR NEGATIVE 06/20/2013 0910   KETONESUR NEGATIVE 06/20/2013 0910   PROTEINUR NEGATIVE 06/20/2013 0910   UROBILINOGEN 0.2 06/20/2013 0910   NITRITE NEGATIVE 06/20/2013 0910   LEUKOCYTESUR TRACE (A) 06/20/2013 0910    STUDIES: No results found.   ASSESSMENT: 85 y.o. [place] woman status post left breast biopsy 05/03/2015 of 2 separate masses, clinically T2 N0, stage IIA invasive lobular breast cancer, grade 2,estrogen receptor positive, progesterone receptor 1% "positive", HER-2 not amplified  (a) right breast biopsy 04/27/2015 shows atypical lobular hyperplasia/complex sclerotic lesion  (1) status post left lumpectomy on 05/29/2015 for a pT3, NX, stage II invasive lobular breast cancer, grade 2, estrogen receptor positive, HER-2 negative, with closebut negative margins  (a) reexcision of left lumpectomy on 06/21/2015 demonstrated several areas of fatty necrosis and atypical lobular hyperplasia  (2) radiation10/01/2015-09/03/2015: Left Breast. 50 Gy in 25 fractions.   (3) anastrozole started 10/25/15  (4) osteopenia:   (a) T-score -1.5 on 09/04/2015  (b) T score of -1.6 on 09/16/2017   PLAN: Emani is now just over 4 years out from definitive surgery for her breast cancer with no evidence of disease recurrence.  This is very favorable.  She is tolerating anastrozole well and the plan is to continue that a total of 5years.  I am referring her to our physical therapy for balance issues in case that is helpful to her.  Of course I will also screen her for vestibular problems.  In any case we talked about fall precautions at length today  She will see me one last time a year from now and she will "graduate" at that point  She is taking appropriate pandemic  precautions  She knows to call for any other issue that may develop before her next visit.  Derrek Puff, Virgie Dad, MD  08/10/19 11:28 AM Medical Oncology and Hematology [facility] [place], [place] [address] Tel. [phone]    Fax. [phone]  I, Jacqualyn Posey am acting as a Education administrator for Chauncey Cruel, MD.   I, Lurline Del MD, have reviewed the above documentation for accuracy and completeness, and I agree with the above.

## 2019-08-11 ENCOUNTER — Telehealth: Payer: Self-pay | Admitting: Oncology

## 2019-08-11 NOTE — Telephone Encounter (Signed)
I talk with patient regarding schedule  

## 2019-08-17 ENCOUNTER — Other Ambulatory Visit: Payer: Self-pay

## 2019-08-17 ENCOUNTER — Ambulatory Visit: Payer: Medicare Other | Attending: Oncology

## 2019-08-17 DIAGNOSIS — M6281 Muscle weakness (generalized): Secondary | ICD-10-CM

## 2019-08-17 DIAGNOSIS — Z170 Estrogen receptor positive status [ER+]: Secondary | ICD-10-CM

## 2019-08-17 DIAGNOSIS — C50212 Malignant neoplasm of upper-inner quadrant of left female breast: Secondary | ICD-10-CM | POA: Insufficient documentation

## 2019-08-17 DIAGNOSIS — R2689 Other abnormalities of gait and mobility: Secondary | ICD-10-CM | POA: Diagnosis present

## 2019-08-17 NOTE — Therapy (Signed)
[place], [place], [address] Phone: [phone]   Fax:  [phone]  Physical Therapy Evaluation  Patient Details  Name: Courtney Shea MRN: [id number] Date of Birth: 08/02/1930 Referring Provider (PT): Lurline Del MD   Encounter Date: 08/17/2019  PT End of Session - 08/17/19 1611    Visit Number  1    Number of Visits  17    Date for PT Re-Evaluation  09/21/19    PT Start Time  1306    PT Stop Time  1354    PT Time Calculation (min)  48 min    Activity Tolerance  Patient tolerated treatment well    Behavior During Therapy  [facility] for tasks assessed/performed       Past Medical History:  Diagnosis Date  . Breast cancer ([place])   . Breast cancer of lower-outer quadrant of right female breast ([place]) 05/07/2015  . GERD (gastroesophageal reflux disease)   . Hypertension   . Hypothyroid   . Personal history of radiation therapy   . Stroke [facility])    TIA, no deficits, placed on Plavix  . Syncope   . Ventricular bigeminy     Past Surgical History:  Procedure Laterality Date  . ABDOMINAL HYSTERECTOMY    . BLADDER SUSPENSION    . BREAST LUMPECTOMY Right    2016  . CATARACT EXTRACTION    . INCONTINENCE SURGERY    . Ovarian cyst removed    . PARTIAL MASTECTOMY WITH NEEDLE LOCALIZATION Bilateral 05/29/2015   Procedure: LEFT BREAST PARTIAL MASTECTOMY WITH NEEDLE LOCALIZATION TIMES 2 AND RIGHT BREAST PARTIAL MASTECTOMY WITH NEEDLE LOCALIZATION;  Surgeon: Erroll Luna, MD;  Location: [facility];  Service: General;  Laterality: Bilateral;  . RE-EXCISION OF BREAST LUMPECTOMY Left 06/21/2015   Procedure: LEFT BREAST RE EXCISION OF LUMPECTOMY;  Surgeon: Erroll Luna, MD;  Location: [facility];  Service: General;  Laterality: Left;    There were no vitals filed for this visit.   Subjective Assessment - 08/17/19 1513    Subjective  Pt reports that she has had 2 falls this  year without hitting her head. She states that she has not felt dizzy she has just noticed that her balance is getting worse. She has a walker at home that she uses sometimes but mostly she furniture walks at home. She states that some days are worse than other with her balance. Pt states that when she is at the grocery store she tends to hold onto the grocery. No recent dental surgeries, neck surgeries, ear infections and reports that she rarely gets head aches. She states that she has to remind herself to drink fluid.    Pertinent History  Hx L estrogen positive breast cancer    Patient Stated Goals  "I want to be able to walk much better."    Currently in Pain?  No/denies         [facility] PT Assessment - 08/17/19 0001      Assessment   Medical Diagnosis  L breast cancer, Balance    Referring Provider (PT)  Lurline Del MD    Hand Dominance  Right    Prior Therapy  Not for this issue.       Balance Screen   Has the patient fallen in the past 6 months  Yes    How many times?  2    Has the patient had a decrease in activity level because of a fear  of falling?   Yes    Is the patient reluctant to leave their home because of a fear of falling?   No      Home Environment   Living Environment  Private residence    Living Arrangements  Spouse/significant other    Available Help at Discharge  Family    Type of [place] to enter    Entrance Stairs-Number of Steps  1    Entrance Stairs-Rails  None    Home Layout  Two level    Alternate Level Stairs-Number of Steps  13    Alternate Level Stairs-Rails  Right    Additional Comments  Pt does not have to go into the basement in her house.       Prior Function   Level of Independence  Independent    Vocation  Retired    Leisure  Walk      Cognition   Overall Cognitive Status  Within Functional Limits for tasks assessed      Coordination   Gross Motor Movements are Fluid and Coordinated  Yes    Finger Nose  Finger Test  Slight difficulty on target slow but not excessively    Heel Shin Test  WNL      Posture/Postural Control   Posture/Postural Control  Postural limitations    Postural Limitations  Rounded Shoulders;Forward head      ROM / Strength   AROM / PROM / Strength  Strength      Strength   Strength Assessment Site  Hip;Knee;Ankle    Right/Left Hip  Right;Left    Right Hip Flexion  3+/5    Right Hip ABduction  4/5    Right Hip ADduction  4-/5    Left Hip Flexion  3+/5    Left Hip ABduction  4/5    Left Hip ADduction  4-/5    Right/Left Knee  Right;Left    Right Knee Flexion  4-/5    Right Knee Extension  5/5    Left Knee Flexion  4-/5    Left Knee Extension  5/5    Right/Left Ankle  Right;Left    Right Ankle Dorsiflexion  5/5    Right Ankle Plantar Flexion  5/5    Right Ankle Inversion  4/5    Right Ankle Eversion  4/5      Balance   Balance Assessed  Yes      Standardized Balance Assessment   Standardized Balance Assessment  Five Times Sit to Stand;Berg Balance Test    Five times sit to stand comments   27 seconds heavy UE use for standing w/knee valgus on standing/sitting      Berg Balance Test   Sit to Stand  Able to stand  independently using hands    Standing Unsupported  Able to stand safely 2 minutes    Sitting with Back Unsupported but Feet Supported on Floor or Stool  Able to sit safely and securely 2 minutes    Stand to Sit  Uses backs of legs against chair to control descent    Transfers  Able to transfer safely, definite need of hands    Standing Unsupported with Eyes Closed  Able to stand 10 seconds with supervision    Standing Unsupported with Feet Together  Able to place feet together independently and stand for 1 minute with supervision    From Standing, Reach Forward with Outstretched Arm  Can reach forward >12  cm safely (5")    From Standing Position, Pick up Object from [place] to pick up shoe, needs supervision    From Standing Position, Turn  to Look Behind Over each Shoulder  Looks behind one side only/other side shows less weight shift    Turn 360 Degrees  Needs close supervision or verbal cueing    Standing Unsupported, Alternately Place Feet on Step/Stool  Needs assistance to keep from falling or unable to try    Standing Unsupported, One Foot in [employer] balance while stepping or standing    Standing on One Leg  Unable to try or needs assist to prevent fall    Total Score  32                Objective measurements completed on examination: See above findings.      [facility] Adult PT Treatment/Exercise - 08/17/19 0001      Exercises   Exercises  Other Exercises    Other Exercises   Heel raises, toe raises, side stepping 10x each, Gastroc stretching in sitting 3x 20 seconds for lower leg stimulation and strenghtening to improve proprioception.              PT Education - 08/17/19 1552    Education Details  Access Code: [email or web address], Discussed balance and the vestibular system with patient. Pt was educated on the importance of movement in improving balance. She was educated on the importance of hip strenth and LE strength for improved balance. Discussed the different types of righting reactions including ankle, hip and stepping. Discussed fear of falling and how this can cuase a greater risk for falls.    Methods  Explanation;Demonstration;Verbal cues;Handout    Comprehension  Verbalized understanding;Returned demonstration       PT Short Term Goals - 08/17/19 1557      PT SHORT TERM GOAL #1   Title  Pt will be independent with initial HEP to demonstrate autonomy of care.    Baseline  Pt did note have HEP    Time  1    Period  Weeks    Status  New    Target Date  08/31/19        PT Long Term Goals - 08/17/19 1603      PT LONG TERM GOAL #1   Title  Pt will demonstrate a 45 on the BERG balance test in order to demonstrate a decreased risk for falls and injury.    Baseline  32    Time  8    Period   Weeks    Status  New    Target Date  10/19/19      PT LONG TERM GOAL #2   Title  Pt will perform 5x sit to stand in 25 seconds or less without UE support in order to demonstrate improved functional strength    Baseline  27 seconds w/Heavy UE use    Time  8    Period  Weeks    Status  New    Target Date  10/19/19      PT LONG TERM GOAL #3   Title  Pt will demonstrate 4+/5 Hip flexion strength for improve hip strategy with LOB    Baseline  3+/5    Time  8    Period  Weeks    Status  New    Target Date  10/19/19      PT LONG TERM GOAL #4   Title  Pt  will have upright posture with gait with LRAD at the appropriate height for pt to decrease risk for falls    Baseline  Pt uses SPC and has very stooped posture    Time  8    Period  Weeks    Status  New    Target Date  10/19/19             Plan - 08/17/19 1553    Clinical Impression Statement  Pt presents to physical therapy with significant balance deficits demonstrating high risk for falls on the BERG balance test and heavy use of UE in the 5x sit to stand demonstrating significant functional LE weakness. Pt demonstrates no difficulty with VOR activities suggesting that her balance deficits are from poor proprioception and strength in the BLE as well as potentially fear of falling. Pt will benefit from skilled physical therapy services in order to address the above limitations.    Personal Factors and Comorbidities  Age;Comorbidity 1    Comorbidities  HTN    Stability/Clinical Decision Making  Stable/Uncomplicated    Clinical Decision Making  Low    Rehab Potential  Good    PT Frequency  2x / week    PT Duration  8 weeks    PT Treatment/Interventions  ADLs/Self Care Home Management;Electrical Stimulation;Cryotherapy;Gait training;Stair training;Functional mobility training;Therapeutic activities;Therapeutic exercise;Balance training;Neuromuscular re-education;Patient/family education;Manual techniques    PT Next Visit Plan   Progress balance activities, semi-tandem standing, SLS w/hand support    PT Home Exercise Plan  Access Code: [email or web address]    Consulted and Agree with Plan of Care  Patient       Patient will benefit from skilled therapeutic intervention in order to improve the following deficits and impairments:  Abnormal gait, Decreased balance, Decreased strength  Visit Diagnosis: Malignant neoplasm of upper-inner quadrant of left breast in female, estrogen receptor positive (HCC)  Other abnormalities of gait and mobility  Muscle weakness (generalized)     Problem List Patient Active Problem List   Diagnosis Date Noted  . Malignant neoplasm of upper-inner quadrant of left breast in female, estrogen receptor positive ([place]) 07/14/2017  . Osteopenia 11/07/2015  . Carcinoma of upper-inner quadrant of left female breast ([place]) 07/03/2015  . TIA (transient ischemic attack) 06/20/2013  . Hypokalemia 06/20/2013  . Ventricular bigeminy 12/05/2011  . Leukocytosis 12/05/2011  . Syncope 12/04/2011  . Hypothyroidism 12/04/2011  . HTN (hypertension) 12/04/2011  . UTI (lower urinary tract infection) 12/04/2011  . PAIN IN LIMB 04/02/2007    Ander Purpura, PT 08/17/2019, 4:15 PM  [place], [place], [address] Phone: [phone]   Fax:  [phone]  Name: Courtney Shea MRN: [id number] Date of Birth: 06/20/1930

## 2019-08-17 NOTE — Patient Instructions (Signed)
Access Code: [email or web address]  URL: [email or web address]  Date: 08/17/2019  Prepared by: Tomma Rakers   Exercises Heel rises with counter support - 10 reps - 1 sets - 2-3x daily - 7x weekly Heel Toe Raises with Counter Support - 10 reps - 1 sets - 2-3x daily - 7x weekly Side Stepping with Counter Support - 10 reps - 1 sets - 2-3x daily - 7x weekly Seated Gastroc Stretch with Strap - 3 reps - 1 sets - hold for 20 seconds hold - 2-3x daily - 7x weekly

## 2019-08-25 ENCOUNTER — Ambulatory Visit: Payer: Medicare Other

## 2019-08-31 ENCOUNTER — Encounter: Payer: Self-pay | Admitting: Physical Therapy

## 2019-08-31 ENCOUNTER — Other Ambulatory Visit: Payer: Self-pay

## 2019-08-31 ENCOUNTER — Ambulatory Visit: Payer: Medicare Other | Attending: Oncology | Admitting: Physical Therapy

## 2019-08-31 DIAGNOSIS — R2681 Unsteadiness on feet: Secondary | ICD-10-CM

## 2019-08-31 DIAGNOSIS — R2689 Other abnormalities of gait and mobility: Secondary | ICD-10-CM

## 2019-08-31 DIAGNOSIS — M6281 Muscle weakness (generalized): Secondary | ICD-10-CM

## 2019-08-31 NOTE — Therapy (Signed)
[place], [place], [address] Phone: [phone]   Fax:  [phone]  Physical Therapy Treatment  Patient Details  Name: Courtney Shea MRN: [id number] Date of Birth: April 27, 1930 Referring Provider (PT): Lurline Del MD   Encounter Date: 08/31/2019  PT End of Session - 08/31/19 1418    Visit Number  2    Date for PT Re-Evaluation  10/17/19    PT Start Time  1310    PT Stop Time  1352    PT Time Calculation (min)  42 min    Activity Tolerance  Patient tolerated treatment well    Behavior During Therapy  [facility] for tasks assessed/performed       Past Medical History:  Diagnosis Date  . Breast cancer ([place])   . Breast cancer of lower-outer quadrant of right female breast ([place]) 05/07/2015  . GERD (gastroesophageal reflux disease)   . Hypertension   . Hypothyroid   . Personal history of radiation therapy   . Stroke [facility])    TIA, no deficits, placed on Plavix  . Syncope   . Ventricular bigeminy     Past Surgical History:  Procedure Laterality Date  . ABDOMINAL HYSTERECTOMY    . BLADDER SUSPENSION    . BREAST LUMPECTOMY Right    2016  . CATARACT EXTRACTION    . INCONTINENCE SURGERY    . Ovarian cyst removed    . PARTIAL MASTECTOMY WITH NEEDLE LOCALIZATION Bilateral 05/29/2015   Procedure: LEFT BREAST PARTIAL MASTECTOMY WITH NEEDLE LOCALIZATION TIMES 2 AND RIGHT BREAST PARTIAL MASTECTOMY WITH NEEDLE LOCALIZATION;  Surgeon: Erroll Luna, MD;  Location: [facility];  Service: General;  Laterality: Bilateral;  . RE-EXCISION OF BREAST LUMPECTOMY Left 06/21/2015   Procedure: LEFT BREAST RE EXCISION OF LUMPECTOMY;  Surgeon: Erroll Luna, MD;  Location: [facility];  Service: General;  Laterality: Left;    There were no vitals filed for this visit.  Subjective Assessment - 08/31/19 1311    Subjective  Patient had a few questions about the exercises but feels like  she is doing them well.  No new falls                       [facility] Adult PT Treatment/Exercise - 08/31/19 0001      High Level Balance   High Level Balance Activities  Side stepping;Backward walking;Sudden stops;Direction changes    High Level Balance Comments  on the firm airex step onto and off of, then on the firm airex trying to balance and some reaching, this was very difficult for her, very fearful with this, ball toss      Exercises   Exercises  Knee/Hip      Knee/Hip Exercises: Aerobic   Nustep  level 4 x 5 minutes      Knee/Hip Exercises: Machines for Strengthening   Cybex Knee Extension  5#2x10    Cybex Knee Flexion  20#2x10      Knee/Hip Exercises: Standing   Other Standing Knee Exercises  using SPC 6" toe touches               PT Short Term Goals - 08/31/19 1420      PT SHORT TERM GOAL #1   Title  Pt will be independent with initial HEP to demonstrate autonomy of care.    Status  Partially Met        PT Long Term Goals -  08/17/19 1603      PT LONG TERM GOAL #1   Title  Pt will demonstrate a 45 on the BERG balance test in order to demonstrate a decreased risk for falls and injury.    Baseline  32    Time  8    Period  Weeks    Status  New    Target Date  10/19/19      PT LONG TERM GOAL #2   Title  Pt will perform 5x sit to stand in 25 seconds or less without UE support in order to demonstrate improved functional strength    Baseline  27 seconds w/Heavy UE use    Time  8    Period  Weeks    Status  New    Target Date  10/19/19      PT LONG TERM GOAL #3   Title  Pt will demonstrate 4+/5 Hip flexion strength for improve hip strategy with LOB    Baseline  3+/5    Time  8    Period  Weeks    Status  New    Target Date  10/19/19      PT LONG TERM GOAL #4   Title  Pt will have upright posture with gait with LRAD at the appropriate height for pt to decrease risk for falls    Baseline  Pt uses SPC and has very stooped posture     Time  8    Period  Weeks    Status  New    Target Date  10/19/19            Plan - 08/31/19 1418    Clinical Impression Statement  Patient did well today, the biggest issue is balance on dynamic surface, this was very difficult for her.  She is very scared and anxious with some of the balance activities and required use of cane and CGA but could get to some SBA as she started to feel more comfortable    PT Next Visit Plan  Progress balance activities, semi-tandem standing, SLS w/hand support    Consulted and Agree with Plan of Care  Patient       Patient will benefit from skilled therapeutic intervention in order to improve the following deficits and impairments:  Abnormal gait, Decreased balance, Decreased strength  Visit Diagnosis: Other abnormalities of gait and mobility  Muscle weakness (generalized)  Unsteadiness on feet     Problem List Patient Active Problem List   Diagnosis Date Noted  . Malignant neoplasm of upper-inner quadrant of left breast in female, estrogen receptor positive ([place]) 07/14/2017  . Osteopenia 11/07/2015  . Carcinoma of upper-inner quadrant of left female breast ([place]) 07/03/2015  . TIA (transient ischemic attack) 06/20/2013  . Hypokalemia 06/20/2013  . Ventricular bigeminy 12/05/2011  . Leukocytosis 12/05/2011  . Syncope 12/04/2011  . Hypothyroidism 12/04/2011  . HTN (hypertension) 12/04/2011  . UTI (lower urinary tract infection) 12/04/2011  . PAIN IN LIMB 04/02/2007    Sumner Boast., PT 08/31/2019, 2:21 PM  [place] [address] [address] [place], [place], [address] Phone: [phone]   Fax:  [phone]  Name: Courtney Shea MRN: [id number] Date of Birth: 1930/05/09

## 2019-09-05 ENCOUNTER — Encounter: Payer: Self-pay | Admitting: Physical Therapy

## 2019-09-05 ENCOUNTER — Other Ambulatory Visit: Payer: Self-pay

## 2019-09-05 ENCOUNTER — Ambulatory Visit: Payer: Medicare Other | Admitting: Physical Therapy

## 2019-09-05 DIAGNOSIS — R2689 Other abnormalities of gait and mobility: Secondary | ICD-10-CM

## 2019-09-05 DIAGNOSIS — M6281 Muscle weakness (generalized): Secondary | ICD-10-CM

## 2019-09-05 DIAGNOSIS — R2681 Unsteadiness on feet: Secondary | ICD-10-CM

## 2019-09-05 NOTE — Therapy (Signed)
[place], [place], [address] Phone: [phone]   Fax:  [phone]  Physical Therapy Treatment  Patient Details  Name: Courtney Shea MRN: [id number] Date of Birth: Jul 03, 1930 Referring Provider (PT): Lurline Del MD   Encounter Date: 09/05/2019  PT End of Session - 09/05/19 1412    Visit Number  3    Number of Visits  17    Date for PT Re-Evaluation  10/17/19    PT Start Time  1315    PT Stop Time  1356    PT Time Calculation (min)  41 min    Activity Tolerance  Patient tolerated treatment well    Behavior During Therapy  [facility] for tasks assessed/performed       Past Medical History:  Diagnosis Date  . Breast cancer ([place])   . Breast cancer of lower-outer quadrant of right female breast ([place]) 05/07/2015  . GERD (gastroesophageal reflux disease)   . Hypertension   . Hypothyroid   . Personal history of radiation therapy   . Stroke [facility])    TIA, no deficits, placed on Plavix  . Syncope   . Ventricular bigeminy     Past Surgical History:  Procedure Laterality Date  . ABDOMINAL HYSTERECTOMY    . BLADDER SUSPENSION    . BREAST LUMPECTOMY Right    2016  . CATARACT EXTRACTION    . INCONTINENCE SURGERY    . Ovarian cyst removed    . PARTIAL MASTECTOMY WITH NEEDLE LOCALIZATION Bilateral 05/29/2015   Procedure: LEFT BREAST PARTIAL MASTECTOMY WITH NEEDLE LOCALIZATION TIMES 2 AND RIGHT BREAST PARTIAL MASTECTOMY WITH NEEDLE LOCALIZATION;  Surgeon: Erroll Luna, MD;  Location: [facility];  Service: General;  Laterality: Bilateral;  . RE-EXCISION OF BREAST LUMPECTOMY Left 06/21/2015   Procedure: LEFT BREAST RE EXCISION OF LUMPECTOMY;  Surgeon: Erroll Luna, MD;  Location: [facility];  Service: General;  Laterality: Left;    There were no vitals filed for this visit.  Subjective Assessment - 09/05/19 1318    Subjective  Pt reported that today was not a good  day, she stated that she is having some arthritis pain in her hips    Currently in Pain?  Yes    Pain Score  3     Pain Location  Hip    Pain Orientation  Left;Right                       [facility] Adult PT Treatment/Exercise - 09/05/19 0001      High Level Balance   High Level Balance Activities  Side stepping;Backward walking;Negotiating over obstacles;Negotitating around obstacles      Knee/Hip Exercises: Aerobic   Nustep  level 3 x 6 minutes      Knee/Hip Exercises: Machines for Strengthening   Cybex Knee Extension  5#2x10    Cybex Knee Flexion  20#2x10      Knee/Hip Exercises: Standing   Other Standing Knee Exercises  using SPC 6" toe touches 2x10                PT Short Term Goals - 08/31/19 1420      PT SHORT TERM GOAL #1   Title  Pt will be independent with initial HEP to demonstrate autonomy of care.    Status  Partially Met        PT Long Term Goals - 08/17/19 1603  PT LONG TERM GOAL #1   Title  Pt will demonstrate a 45 on the BERG balance test in order to demonstrate a decreased risk for falls and injury.    Baseline  32    Time  8    Period  Weeks    Status  New    Target Date  10/19/19      PT LONG TERM GOAL #2   Title  Pt will perform 5x sit to stand in 25 seconds or less without UE support in order to demonstrate improved functional strength    Baseline  27 seconds w/Heavy UE use    Time  8    Period  Weeks    Status  New    Target Date  10/19/19      PT LONG TERM GOAL #3   Title  Pt will demonstrate 4+/5 Hip flexion strength for improve hip strategy with LOB    Baseline  3+/5    Time  8    Period  Weeks    Status  New    Target Date  10/19/19      PT LONG TERM GOAL #4   Title  Pt will have upright posture with gait with LRAD at the appropriate height for pt to decrease risk for falls    Baseline  Pt uses SPC and has very stooped posture    Time  8    Period  Weeks    Status  New    Target Date  10/19/19             Plan - 09/05/19 1413    Clinical Impression Statement  Pt enters clinic reporting that she is not having a good day today. Some instability with high level balance activities requiring SBA to CGA. She remains very fearful with standing on non compliant surfaces. cues to perform the full ROM with seated leg ext and curls.    Personal Factors and Comorbidities  Age;Comorbidity 1    Stability/Clinical Decision Making  Stable/Uncomplicated    Rehab Potential  Good    PT Frequency  2x / week    PT Treatment/Interventions  ADLs/Self Care Home Management;Electrical Stimulation;Cryotherapy;Gait training;Stair training;Functional mobility training;Therapeutic activities;Therapeutic exercise;Balance training;Neuromuscular re-education;Patient/family education;Manual techniques    PT Next Visit Plan  Progress balance activities, semi-tandem standing, SLS w/hand support       Patient will benefit from skilled therapeutic intervention in order to improve the following deficits and impairments:  Abnormal gait, Decreased balance, Decreased strength  Visit Diagnosis: Unsteadiness on feet  Muscle weakness (generalized)  Other abnormalities of gait and mobility     Problem List Patient Active Problem List   Diagnosis Date Noted  . Malignant neoplasm of upper-inner quadrant of left breast in female, estrogen receptor positive ([place]) 07/14/2017  . Osteopenia 11/07/2015  . Carcinoma of upper-inner quadrant of left female breast ([place]) 07/03/2015  . TIA (transient ischemic attack) 06/20/2013  . Hypokalemia 06/20/2013  . Ventricular bigeminy 12/05/2011  . Leukocytosis 12/05/2011  . Syncope 12/04/2011  . Hypothyroidism 12/04/2011  . HTN (hypertension) 12/04/2011  . UTI (lower urinary tract infection) 12/04/2011  . PAIN IN LIMB 04/02/2007    Scot Jun, PTA 09/05/2019, 2:16 PM  [place] [address]  [place], [place], [address] Phone: [phone]   Fax:  [phone]  Name: Courtney Shea MRN: [id number] Date of Birth: Mar 14, 1930

## 2019-09-07 ENCOUNTER — Encounter: Payer: Self-pay | Admitting: Physical Therapy

## 2019-09-07 ENCOUNTER — Other Ambulatory Visit: Payer: Self-pay

## 2019-09-07 ENCOUNTER — Ambulatory Visit: Payer: Medicare Other | Admitting: Physical Therapy

## 2019-09-07 DIAGNOSIS — R2681 Unsteadiness on feet: Secondary | ICD-10-CM

## 2019-09-07 DIAGNOSIS — R2689 Other abnormalities of gait and mobility: Secondary | ICD-10-CM

## 2019-09-07 DIAGNOSIS — M6281 Muscle weakness (generalized): Secondary | ICD-10-CM

## 2019-09-07 NOTE — Therapy (Signed)
[place] [address] [place], [place], [address] Phone: [phone]   Fax:  [phone]  Physical Therapy Treatment  Patient Details  Name: Courtney Shea MRN: [id number] Date of Birth: Feb 27, 1930 Referring Provider (PT): Lurline Del MD   Encounter Date: 09/07/2019  PT End of Session - 09/07/19 1354    Visit Number  4    Date for PT Re-Evaluation  10/17/19    PT Start Time  1315    PT Stop Time  1357    PT Time Calculation (min)  42 min    Activity Tolerance  Patient tolerated treatment well    Behavior During Therapy  Courtney Shea for tasks assessed/performed       Past Medical History:  Diagnosis Date  . Breast cancer ([place])   . Breast cancer of lower-outer quadrant of right female breast ([place]) 05/07/2015  . GERD (gastroesophageal reflux disease)   . Hypertension   . Hypothyroid   . Personal history of radiation therapy   . Stroke [facility])    TIA, no deficits, placed on Plavix  . Syncope   . Ventricular bigeminy     Past Surgical History:  Procedure Laterality Date  . ABDOMINAL HYSTERECTOMY    . BLADDER SUSPENSION    . BREAST LUMPECTOMY Right    2016  . CATARACT EXTRACTION    . INCONTINENCE SURGERY    . Ovarian cyst removed    . PARTIAL MASTECTOMY WITH NEEDLE LOCALIZATION Bilateral 05/29/2015   Procedure: LEFT BREAST PARTIAL MASTECTOMY WITH NEEDLE LOCALIZATION TIMES 2 AND RIGHT BREAST PARTIAL MASTECTOMY WITH NEEDLE LOCALIZATION;  Surgeon: Erroll Luna, MD;  Location: [place];  Service: General;  Laterality: Bilateral;  . RE-EXCISION OF BREAST LUMPECTOMY Left 06/21/2015   Procedure: LEFT BREAST RE EXCISION OF LUMPECTOMY;  Surgeon: Erroll Luna, MD;  Location: [place];  Service: General;  Laterality: Left;    There were no vitals filed for this visit.  Subjective Assessment - 09/07/19 1315    Subjective  Patient reports that she has been a little sore.  Reports no  falls    Currently in Pain?  No/denies                       [facility] Adult PT Treatment/Exercise - 09/07/19 0001      High Level Balance   High Level Balance Comments  ball toss, ball kicks while in corner, with the kicks needed to hold wall, walking ball toss some cues to keep moving, side stepping with ball toss      Knee/Hip Exercises: Aerobic   Nustep  level 4 x 6 minutes      Knee/Hip Exercises: Machines for Strengthening   Cybex Knee Extension  5#2x10    Cybex Knee Flexion  20#2x10      Knee/Hip Exercises: Standing   Heel Raises  Both;2 sets;10 reps    Hip Abduction  Both;2 sets;10 reps    Abduction Limitations  2.5#    Hip Extension  Both;2 sets;10 reps    Extension Limitations  2.5#               PT Short Term Goals - 09/07/19 1444      PT SHORT TERM GOAL #1   Title  Pt will be independent with initial HEP to demonstrate autonomy of care.    Status  Achieved        PT Long Term Goals -  08/17/19 1603      PT LONG TERM GOAL #1   Title  Pt will demonstrate a 45 on the BERG balance test in order to demonstrate a decreased risk for falls and injury.    Baseline  32    Time  8    Period  Weeks    Status  New    Target Date  10/19/19      PT LONG TERM GOAL #2   Title  Pt will perform 5x sit to stand in 25 seconds or less without UE support in order to demonstrate improved functional strength    Baseline  27 seconds w/Heavy UE use    Time  8    Period  Weeks    Status  New    Target Date  10/19/19      PT LONG TERM GOAL #3   Title  Pt will demonstrate 4+/5 Hip flexion strength for improve hip strategy with LOB    Baseline  3+/5    Time  8    Period  Weeks    Status  New    Target Date  10/19/19      PT LONG TERM GOAL #4   Title  Pt will have upright posture with gait with LRAD at the appropriate height for pt to decrease risk for falls    Baseline  Pt uses SPC and has very stooped posture    Time  8    Period  Weeks    Status  New     Target Date  10/19/19            Plan - 09/07/19 1354    Clinical Impression Statement  Patient did well with the exercises and the balance, she had one instance when sidestepping and throwing the ball tha ther feet "got stuck". needed mod A to right self.  With the ball toss she did not move her feet at all but did not lose balance, with the ball kicks she used the walls for balance    PT Next Visit Plan  Progress balance activities, semi-tandem standing, SLS w/hand support    Consulted and Agree with Plan of Care  Patient       Patient will benefit from skilled therapeutic intervention in order to improve the following deficits and impairments:  Abnormal gait, Decreased balance, Decreased strength  Visit Diagnosis: Unsteadiness on feet  Muscle weakness (generalized)  Other abnormalities of gait and mobility     Problem List Patient Active Problem List   Diagnosis Date Noted  . Malignant neoplasm of upper-inner quadrant of left breast in female, estrogen receptor positive ([place]) 07/14/2017  . Osteopenia 11/07/2015  . Carcinoma of upper-inner quadrant of left female breast ([place]) 07/03/2015  . TIA (transient ischemic attack) 06/20/2013  . Hypokalemia 06/20/2013  . Ventricular bigeminy 12/05/2011  . Leukocytosis 12/05/2011  . Syncope 12/04/2011  . Hypothyroidism 12/04/2011  . HTN (hypertension) 12/04/2011  . UTI (lower urinary tract infection) 12/04/2011  . PAIN IN LIMB 04/02/2007    Sumner Boast., PT 09/07/2019, 2:45 PM  [place] [address] [place], [place], [address] Phone: [phone]   Fax:  [phone]  Name: Courtney Shea MRN: [id number] Date of Birth: 1930-02-06

## 2019-09-12 ENCOUNTER — Encounter: Payer: Self-pay | Admitting: Physical Therapy

## 2019-09-12 ENCOUNTER — Ambulatory Visit: Payer: Medicare Other | Admitting: Physical Therapy

## 2019-09-12 ENCOUNTER — Other Ambulatory Visit: Payer: Self-pay

## 2019-09-12 DIAGNOSIS — M6281 Muscle weakness (generalized): Secondary | ICD-10-CM

## 2019-09-12 DIAGNOSIS — R2689 Other abnormalities of gait and mobility: Secondary | ICD-10-CM | POA: Diagnosis not present

## 2019-09-12 DIAGNOSIS — R2681 Unsteadiness on feet: Secondary | ICD-10-CM

## 2019-09-12 NOTE — Therapy (Signed)
[place], [place], [address] Phone: [phone]   Fax:  [phone]  Physical Therapy Treatment  Patient Details  Name: Courtney Shea MRN: [id number] Date of Birth: 10-04-1930 Referring Provider (PT): Lurline Del MD   Encounter Date: 09/12/2019  PT End of Session - 09/12/19 1439    Visit Number  5    Date for PT Re-Evaluation  10/17/19    PT Start Time  1357    PT Stop Time  1440    PT Time Calculation (min)  43 min    Activity Tolerance  Patient tolerated treatment well    Behavior During Therapy  [facility] for tasks assessed/performed       Past Medical History:  Diagnosis Date  . Breast cancer ([place])   . Breast cancer of lower-outer quadrant of right female breast ([place]) 05/07/2015  . GERD (gastroesophageal reflux disease)   . Hypertension   . Hypothyroid   . Personal history of radiation therapy   . Stroke [facility])    TIA, no deficits, placed on Plavix  . Syncope   . Ventricular bigeminy     Past Surgical History:  Procedure Laterality Date  . ABDOMINAL HYSTERECTOMY    . BLADDER SUSPENSION    . BREAST LUMPECTOMY Right    2016  . CATARACT EXTRACTION    . INCONTINENCE SURGERY    . Ovarian cyst removed    . PARTIAL MASTECTOMY WITH NEEDLE LOCALIZATION Bilateral 05/29/2015   Procedure: LEFT BREAST PARTIAL MASTECTOMY WITH NEEDLE LOCALIZATION TIMES 2 AND RIGHT BREAST PARTIAL MASTECTOMY WITH NEEDLE LOCALIZATION;  Surgeon: Erroll Luna, MD;  Location: [place];  Service: General;  Laterality: Bilateral;  . RE-EXCISION OF BREAST LUMPECTOMY Left 06/21/2015   Procedure: LEFT BREAST RE EXCISION OF LUMPECTOMY;  Surgeon: Erroll Luna, MD;  Location: [place];  Service: General;  Laterality: Left;    There were no vitals filed for this visit.  Subjective Assessment - 09/12/19 1400    Subjective  Feeling pretty good, just slow and at times unsteady    Currently in Pain?  No/denies                       [facility] Adult PT Treatment/Exercise - 09/12/19 0001      Ambulation/Gait   Gait Comments  walking with hand hold assist working on speed      High Level Balance   High Level Balance Activities  Direction changes    High Level Balance Comments  ball toss, ball kicks while in corner, with the kicks needed to hold wall, walking ball toss some cues to keep moving, side stepping with ball toss, used the stiffer airex and did some head turns      Knee/Hip Exercises: Aerobic   Recumbent Bike  5 minutes      Knee/Hip Exercises: Machines for Strengthening   Cybex Knee Extension  5#2x10    Cybex Knee Flexion  20#2x10      Knee/Hip Exercises: Standing   Hip Flexion  Both;2 sets;10 reps    Hip Flexion Limitations  2.5#    Hip Abduction  Both;2 sets;10 reps    Abduction Limitations  2.5#    Hip Extension  Both;2 sets;10 reps    Extension Limitations  2.5#               PT Short Term Goals - 09/07/19 1444  PT SHORT TERM GOAL #1   Title  Pt will be independent with initial HEP to demonstrate autonomy of care.    Status  Achieved        PT Long Term Goals - 09/12/19 1442      PT LONG TERM GOAL #1   Title  Pt will demonstrate a 45 on the BERG balance test in order to demonstrate a decreased risk for falls and injury.    Status  On-going      PT LONG TERM GOAL #2   Title  Pt will perform 5x sit to stand in 25 seconds or less without UE support in order to demonstrate improved functional strength    Status  On-going            Plan - 09/12/19 1440    Clinical Impression Statement  Patient is really fearful of the airex, she tolerates the stiffer airex better but is still very hesitant.  She does well with the exercises, with the balance and walking she needs close supervision.  Seemed to be a little more unsteady today    PT Next Visit Plan  Progress balance activities, semi-tandem standing, SLS w/hand  support    Consulted and Agree with Plan of Care  Patient       Patient will benefit from skilled therapeutic intervention in order to improve the following deficits and impairments:  Abnormal gait, Decreased balance, Decreased strength  Visit Diagnosis: Unsteadiness on feet  Muscle weakness (generalized)  Other abnormalities of gait and mobility     Problem List Patient Active Problem List   Diagnosis Date Noted  . Malignant neoplasm of upper-inner quadrant of left breast in female, estrogen receptor positive ([place]) 07/14/2017  . Osteopenia 11/07/2015  . Carcinoma of upper-inner quadrant of left female breast ([place]) 07/03/2015  . TIA (transient ischemic attack) 06/20/2013  . Hypokalemia 06/20/2013  . Ventricular bigeminy 12/05/2011  . Leukocytosis 12/05/2011  . Syncope 12/04/2011  . Hypothyroidism 12/04/2011  . HTN (hypertension) 12/04/2011  . UTI (lower urinary tract infection) 12/04/2011  . PAIN IN LIMB 04/02/2007    Sumner Boast., PT 09/12/2019, 2:43 PM  [address] [place], [place], [address] Phone: [phone]   Fax:  [phone]  Name: Courtney Shea MRN: [id number] Date of Birth: 25-Feb-1930

## 2019-09-14 ENCOUNTER — Other Ambulatory Visit: Payer: Self-pay

## 2019-09-14 ENCOUNTER — Ambulatory Visit: Payer: Medicare Other | Admitting: Physical Therapy

## 2019-09-14 ENCOUNTER — Encounter: Payer: Self-pay | Admitting: Physical Therapy

## 2019-09-14 DIAGNOSIS — R2689 Other abnormalities of gait and mobility: Secondary | ICD-10-CM | POA: Diagnosis not present

## 2019-09-14 DIAGNOSIS — R2681 Unsteadiness on feet: Secondary | ICD-10-CM

## 2019-09-14 DIAGNOSIS — M6281 Muscle weakness (generalized): Secondary | ICD-10-CM

## 2019-09-14 NOTE — Therapy (Signed)
[place], [place], [address] Phone: [phone]   Fax:  [phone]  Physical Therapy Treatment  Patient Details  Name: Courtney Shea MRN: [id number] Date of Birth: 1930/09/12 Referring Provider (PT): Lurline Del MD   Encounter Date: 09/14/2019  PT End of Session - 09/14/19 1452    Visit Number  6    Date for PT Re-Evaluation  10/17/19    PT Start Time  1357    PT Stop Time  1443    PT Time Calculation (min)  46 min    Activity Tolerance  Patient tolerated treatment well    Behavior During Therapy  [facility] for tasks assessed/performed       Past Medical History:  Diagnosis Date  . Breast cancer ([place])   . Breast cancer of lower-outer quadrant of right female breast ([place]) 05/07/2015  . GERD (gastroesophageal reflux disease)   . Hypertension   . Hypothyroid   . Personal history of radiation therapy   . Stroke [facility])    TIA, no deficits, placed on Plavix  . Syncope   . Ventricular bigeminy     Past Surgical History:  Procedure Laterality Date  . ABDOMINAL HYSTERECTOMY    . BLADDER SUSPENSION    . BREAST LUMPECTOMY Right    2016  . CATARACT EXTRACTION    . INCONTINENCE SURGERY    . Ovarian cyst removed    . PARTIAL MASTECTOMY WITH NEEDLE LOCALIZATION Bilateral 05/29/2015   Procedure: LEFT BREAST PARTIAL MASTECTOMY WITH NEEDLE LOCALIZATION TIMES 2 AND RIGHT BREAST PARTIAL MASTECTOMY WITH NEEDLE LOCALIZATION;  Surgeon: Erroll Luna, MD;  Location: [place];  Service: General;  Laterality: Bilateral;  . RE-EXCISION OF BREAST LUMPECTOMY Left 06/21/2015   Procedure: LEFT BREAST RE EXCISION OF LUMPECTOMY;  Surgeon: Erroll Luna, MD;  Location: [place];  Service: General;  Laterality: Left;    There were no vitals filed for this visit.  Subjective Assessment - 09/14/19 1400    Subjective  I am just so slow.  No falls or stumbles    Currently in  Pain?  No/denies         [facility] PT Assessment - 09/14/19 0001      Timed Up and Go Test   Normal TUG (seconds)  19                   [facility] Adult PT Treatment/Exercise - 09/14/19 0001      Ambulation/Gait   Gait Comments  walking with hand hold assist working on speed, out to car with [facility] and supervision having her look around at times to challenge her., stairs up and down one flight with hand rail, going up was very good, going down she goes side ways      High Level Balance   High Level Balance Activities  Side stepping;Direction changes;Backward walking;Negotiating over obstacles    High Level Balance Comments  ball toss, head turns, eyes closed, standing on  a pillow, she did this much, toe touches 6" step and then toe touches on a foam roll that could move so she had to have control      Knee/Hip Exercises: Aerobic   Nustep  level 5 x 6 minutes      Knee/Hip Exercises: Machines for Strengthening   Cybex Knee Extension  5#2x10    Cybex Knee Flexion  20#2x10  PT Short Term Goals - 09/07/19 1444      PT SHORT TERM GOAL #1   Title  Pt will be independent with initial HEP to demonstrate autonomy of care.    Status  Achieved        PT Long Term Goals - 09/14/19 1459      PT LONG TERM GOAL #3   Title  Pt will demonstrate 4+/5 Hip flexion strength for improve hip strategy with LOB    Status  On-going      PT LONG TERM GOAL #4   Title  Pt will have upright posture with gait with LRAD at the appropriate height for pt to decrease risk for falls    Status  On-going            Plan - 09/14/19 1453    Clinical Impression Statement  Patient had some good questions about her balance and how she got so bad, I performed a TUG to have a baseline with her and it was 19 seconds.  I explained to her how after a fall that it sticks in your head and many people stop doing things that are challenging and this causes mm weakness and then a loss of  balance, she felt that this was a good explanation.  She did better today with the balance activities and was less apprehansive    PT Next Visit Plan  progress strength and balance    Consulted and Agree with Plan of Care  Patient       Patient will benefit from skilled therapeutic intervention in order to improve the following deficits and impairments:  Abnormal gait, Decreased balance, Decreased strength  Visit Diagnosis: Unsteadiness on feet  Muscle weakness (generalized)  Other abnormalities of gait and mobility     Problem List Patient Active Problem List   Diagnosis Date Noted  . Malignant neoplasm of upper-inner quadrant of left breast in female, estrogen receptor positive ([place]) 07/14/2017  . Osteopenia 11/07/2015  . Carcinoma of upper-inner quadrant of left female breast ([place]) 07/03/2015  . TIA (transient ischemic attack) 06/20/2013  . Hypokalemia 06/20/2013  . Ventricular bigeminy 12/05/2011  . Leukocytosis 12/05/2011  . Syncope 12/04/2011  . Hypothyroidism 12/04/2011  . HTN (hypertension) 12/04/2011  . UTI (lower urinary tract infection) 12/04/2011  . PAIN IN LIMB 04/02/2007    Sumner Boast., PT 09/14/2019, 3:00 PM  [address] [place], [place], [address] Phone: [phone]   Fax:  [phone]  Name: Courtney Shea MRN: [id number] Date of Birth: December 08, 1929

## 2019-09-20 ENCOUNTER — Emergency Department (HOSPITAL_COMMUNITY): Payer: Medicare Other

## 2019-09-20 ENCOUNTER — Encounter (HOSPITAL_COMMUNITY): Payer: Self-pay | Admitting: Emergency Medicine

## 2019-09-20 ENCOUNTER — Ambulatory Visit: Payer: Medicare Other | Admitting: Physical Therapy

## 2019-09-20 ENCOUNTER — Inpatient Hospital Stay (HOSPITAL_COMMUNITY)
Admission: EM | Admit: 2019-09-20 | Discharge: 2019-09-22 | DRG: 087 | Disposition: A | Discharge status: Home Health | Payer: Medicare Other | Attending: Internal Medicine | Admitting: Internal Medicine

## 2019-09-20 DIAGNOSIS — Z853 Personal history of malignant neoplasm of breast: Secondary | ICD-10-CM

## 2019-09-20 DIAGNOSIS — Z7982 Long term (current) use of aspirin: Secondary | ICD-10-CM

## 2019-09-20 DIAGNOSIS — Z923 Personal history of irradiation: Secondary | ICD-10-CM

## 2019-09-20 DIAGNOSIS — Z888 Allergy status to other drugs, medicaments and biological substances status: Secondary | ICD-10-CM

## 2019-09-20 DIAGNOSIS — C50212 Malignant neoplasm of upper-inner quadrant of left female breast: Secondary | ICD-10-CM | POA: Diagnosis present

## 2019-09-20 DIAGNOSIS — W010XXA Fall on same level from slipping, tripping and stumbling without subsequent striking against object, initial encounter: Secondary | ICD-10-CM | POA: Diagnosis present

## 2019-09-20 DIAGNOSIS — S0240FA Zygomatic fracture, left side, initial encounter for closed fracture: Secondary | ICD-10-CM | POA: Diagnosis present

## 2019-09-20 DIAGNOSIS — S065XAA Traumatic subdural hemorrhage with loss of consciousness status unknown, initial encounter: Secondary | ICD-10-CM | POA: Diagnosis present

## 2019-09-20 DIAGNOSIS — I493 Ventricular premature depolarization: Secondary | ICD-10-CM | POA: Diagnosis present

## 2019-09-20 DIAGNOSIS — S0285XA Fracture of orbit, unspecified, initial encounter for closed fracture: Secondary | ICD-10-CM

## 2019-09-20 DIAGNOSIS — M8580 Other specified disorders of bone density and structure, unspecified site: Secondary | ICD-10-CM | POA: Diagnosis present

## 2019-09-20 DIAGNOSIS — I10 Essential (primary) hypertension: Secondary | ICD-10-CM | POA: Diagnosis present

## 2019-09-20 DIAGNOSIS — Z66 Do not resuscitate: Secondary | ICD-10-CM | POA: Diagnosis present

## 2019-09-20 DIAGNOSIS — S066X0A Traumatic subarachnoid hemorrhage without loss of consciousness, initial encounter: Secondary | ICD-10-CM | POA: Diagnosis present

## 2019-09-20 DIAGNOSIS — E039 Hypothyroidism, unspecified: Secondary | ICD-10-CM | POA: Diagnosis present

## 2019-09-20 DIAGNOSIS — S065X0A Traumatic subdural hemorrhage without loss of consciousness, initial encounter: Secondary | ICD-10-CM | POA: Diagnosis not present

## 2019-09-20 DIAGNOSIS — Z79811 Long term (current) use of aromatase inhibitors: Secondary | ICD-10-CM

## 2019-09-20 DIAGNOSIS — Z885 Allergy status to narcotic agent status: Secondary | ICD-10-CM

## 2019-09-20 DIAGNOSIS — Z881 Allergy status to other antibiotic agents status: Secondary | ICD-10-CM

## 2019-09-20 DIAGNOSIS — Z8673 Personal history of transient ischemic attack (TIA), and cerebral infarction without residual deficits: Secondary | ICD-10-CM

## 2019-09-20 DIAGNOSIS — Z20828 Contact with and (suspected) exposure to other viral communicable diseases: Secondary | ICD-10-CM | POA: Diagnosis present

## 2019-09-20 DIAGNOSIS — Z79899 Other long term (current) drug therapy: Secondary | ICD-10-CM

## 2019-09-20 DIAGNOSIS — Z90710 Acquired absence of both cervix and uterus: Secondary | ICD-10-CM

## 2019-09-20 DIAGNOSIS — Z8249 Family history of ischemic heart disease and other diseases of the circulatory system: Secondary | ICD-10-CM

## 2019-09-20 DIAGNOSIS — Z9013 Acquired absence of bilateral breasts and nipples: Secondary | ICD-10-CM

## 2019-09-20 DIAGNOSIS — K219 Gastro-esophageal reflux disease without esophagitis: Secondary | ICD-10-CM | POA: Diagnosis present

## 2019-09-20 DIAGNOSIS — Z7902 Long term (current) use of antithrombotics/antiplatelets: Secondary | ICD-10-CM

## 2019-09-20 DIAGNOSIS — Z79890 Hormone replacement therapy (postmenopausal): Secondary | ICD-10-CM

## 2019-09-20 DIAGNOSIS — Z803 Family history of malignant neoplasm of breast: Secondary | ICD-10-CM

## 2019-09-20 DIAGNOSIS — S02842A Fracture of lateral orbital wall, left side, initial encounter for closed fracture: Secondary | ICD-10-CM | POA: Diagnosis present

## 2019-09-20 DIAGNOSIS — Z882 Allergy status to sulfonamides status: Secondary | ICD-10-CM

## 2019-09-20 DIAGNOSIS — S065X9A Traumatic subdural hemorrhage with loss of consciousness of unspecified duration, initial encounter: Secondary | ICD-10-CM | POA: Diagnosis present

## 2019-09-20 LAB — BASIC METABOLIC PANEL
Anion gap: 12 (ref 5–15)
BUN: 20 mg/dL (ref 8–23)
CO2: 24 mmol/L (ref 22–32)
Calcium: 9.6 mg/dL (ref 8.9–10.3)
Chloride: 100 mmol/L (ref 98–111)
Creatinine, Ser: 0.73 mg/dL (ref 0.44–1.00)
GFR calc Af Amer: >60 mL/min (ref >60)
GFR calc non Af Amer: >60 mL/min (ref >60)
Glucose, Bld: 114 mg/dL — ABNORMAL HIGH (ref 70–99)
Potassium: 3.8 mmol/L (ref 3.5–5.1)
Sodium: 136 mmol/L (ref 135–145)

## 2019-09-20 LAB — TYPE AND SCREEN
ABO/RH(D): A POS
Antibody Screen: NEGATIVE

## 2019-09-20 LAB — ABO/RH: ABO/RH(D): A POS

## 2019-09-20 MED ORDER — SODIUM CHLORIDE 0.9% IV SOLUTION
Freq: Once | INTRAVENOUS | Status: AC
  Administered 2019-09-20: 21:00:00 via INTRAVENOUS

## 2019-09-20 MED ORDER — LEVETIRACETAM 500 MG PO TABS
500.0000 mg | ORAL_TABLET | Freq: Two times a day (BID) | ORAL | Status: DC
  Administered 2019-09-21 – 2019-09-22 (×4): 500 mg via ORAL
  Filled 2019-09-20 (×4): qty 1

## 2019-09-20 NOTE — ED Provider Notes (Signed)
[facility] DEPT Provider Note   CSN: [id number] Arrival date & time: 09/20/19  1508     History   Chief Complaint Chief Complaint  Patient presents with   Fall    HPI Courtney Shea is a 85 y.o. female.     Patient with mechanical fall earlier today. Patient struck the left side of her head when she fell. She has a small laceration to the outer aspect of the left eyebrow that has continued to ooze. The soft tissues of the left orbit are swollen. Patient is on plavix.  No reported loss of consciousness, but patient is unsure of the events before and after the fall.   The history is provided by the patient and the EMS personnel. No language interpreter was used.  Fall This is a recurrent problem. The current episode started 1 to 2 hours ago.    Past Medical History:  Diagnosis Date   Breast cancer [facility])    Breast cancer of lower-outer quadrant of right female breast ([place]) 05/07/2015   GERD (gastroesophageal reflux disease)    Hypertension    Hypothyroid    Personal history of radiation therapy    Stroke ([place])    TIA, no deficits, placed on Plavix   Syncope    Ventricular bigeminy     Patient Active Problem List   Diagnosis Date Noted   Malignant neoplasm of upper-inner quadrant of left breast in female, estrogen receptor positive ([place]) 07/14/2017   Osteopenia 11/07/2015   Carcinoma of upper-inner quadrant of left female breast ([place]) 07/03/2015   TIA (transient ischemic attack) 06/20/2013   Hypokalemia 06/20/2013   Ventricular bigeminy 12/05/2011   Leukocytosis 12/05/2011   Syncope 12/04/2011   Hypothyroidism 12/04/2011   HTN (hypertension) 12/04/2011   UTI (lower urinary tract infection) 12/04/2011   PAIN IN LIMB 04/02/2007    Past Surgical History:  Procedure Laterality Date   ABDOMINAL HYSTERECTOMY     BLADDER SUSPENSION     BREAST LUMPECTOMY Right    2016   CATARACT EXTRACTION     INCONTINENCE  SURGERY     Ovarian cyst removed     PARTIAL MASTECTOMY WITH NEEDLE LOCALIZATION Bilateral 05/29/2015   Procedure: LEFT BREAST PARTIAL MASTECTOMY WITH NEEDLE LOCALIZATION TIMES 2 AND RIGHT BREAST PARTIAL MASTECTOMY WITH NEEDLE LOCALIZATION;  Surgeon: Erroll Luna, MD;  Location: [place];  Service: General;  Laterality: Bilateral;   RE-EXCISION OF BREAST LUMPECTOMY Left 06/21/2015   Procedure: LEFT BREAST RE EXCISION OF LUMPECTOMY;  Surgeon: Erroll Luna, MD;  Location: [place];  Service: General;  Laterality: Left;     OB History   No obstetric history on file.      Home Medications    Prior to Admission medications   Medication Sig Start Date End Date Taking? Authorizing Provider  anastrozole (ARIMIDEX) 1 MG tablet TAKE 1 TABLET BY MOUTH EVERY DAY 02/28/19   Magrinat, Virgie Dad, MD  aspirin EC 81 MG tablet Take 81 mg by mouth once.     [provider]  Calcium Carbonate-Vitamin D (CALCIUM 600 + D PO) Take 1 tablet by mouth every evening.     [provider]  clobetasol (TEMOVATE) 0.05 % external solution Apply 1 application topically 2 (two) times daily.  06/28/15   [provider]  cloNIDine (CATAPRES) 0.2 MG tablet Take 0.2 mg by mouth at bedtime. 04/27/15   [provider]  clopidogrel (PLAVIX) 75 MG tablet Take 75 mg by mouth  daily.    [provider]  Cranberry 500 MG CAPS Take 500 mg by mouth daily.    [provider]  furosemide (LASIX) 20 MG tablet Take 20 mg by mouth daily.     [provider]  hydrALAZINE (APRESOLINE) 25 MG tablet Take 25 mg by mouth 2 (two) times daily with a meal. 05/29/18   [provider]  HYDROcodone-acetaminophen (NORCO/VICODIN) 5-325 MG tablet Take 1 tablet by mouth every 4 (four) hours as needed. Patient not taking: Reported on 08/17/2019 01/05/19   Recardo Evangelist, PA-C  levothyroxine (SYNTHROID, LEVOTHROID) 88 MCG tablet Take 88 mcg by mouth  daily.    [provider]  losartan (COZAAR) 100 MG tablet Take 100 mg by mouth daily.    [provider]  metoprolol succinate (TOPROL-XL) 50 MG 24 hr tablet Take 50 mg by mouth daily. Take with or immediately following a meal.    [provider]  pantoprazole (PROTONIX) 40 MG tablet Take 40 mg by mouth every evening.    [provider]    Family History Family History  Problem Relation Age of Onset   Coronary artery disease Mother        MI at age 88   Breast cancer Sister 71   Kidney disease Brother    Cancer Sister        colon cancer suggested    Social History Social History   Tobacco Use   Smoking status: Never Smoker   Smokeless tobacco: Never Used  Substance Use Topics   Alcohol use: No   Drug use: No     Allergies   Codeine, Macrodantin [nitrofurantoin macrocrystal], Crestor [rosuvastatin calcium], Doxycycline, Hydrochlorothiazide, Norvasc [amlodipine besylate], and Sulfa antibiotics   Review of Systems Review of Systems  HENT: Positive for facial swelling.   Neurological: Negative for syncope.  All other systems reviewed and are negative.    Physical Exam Updated Vital Signs BP (!) 180/89    Pulse 66    Temp 98.2 F (36.8 C) (Oral)    Resp 15    SpO2 100%   Physical Exam Vitals signs and nursing note reviewed.  HENT:     Nose: Nose normal.     Mouth/Throat:     Mouth: Mucous membranes are moist.  Eyes:     Comments: Unable to visualize the left eye d/t soft tissue swelling of eyelids.   Neck:     Musculoskeletal: Neck supple. No muscular tenderness.  Cardiovascular:     Rate and Rhythm: Normal rate and regular rhythm.  Pulmonary:     Effort: Pulmonary effort is normal.     Breath sounds: Normal breath sounds.  Abdominal:     Palpations: Abdomen is soft.  Musculoskeletal: Normal range of motion.  Skin:    Findings: Bruising present.  Neurological:     Mental Status: She is alert and oriented to  person, place, and time.  Psychiatric:        Mood and Affect: Mood normal.      ED Treatments / Results  Labs (all labs ordered are listed, but only abnormal results are displayed) Labs Reviewed - No data to display  EKG None  Radiology Ct Head Wo Contrast  Result Date: 09/20/2019 CLINICAL DATA:  Fall.  Facial injury EXAM: CT HEAD WITHOUT CONTRAST CT MAXILLOFACIAL WITHOUT CONTRAST TECHNIQUE: Multidetector CT imaging of the head and maxillofacial structures were performed using the standard protocol without intravenous contrast. Multiplanar CT image reconstructions of the maxillofacial  structures were also generated. COMPARISON:  CT head 12/20/2017 FINDINGS: CT HEAD FINDINGS Brain: Moderate atrophy. Acute subdural hematoma on the left measuring up to 4 mm in thickness. Small area of subarachnoid hemorrhage in the left sylvian fissure. 3 mm midline shift to the right is unchanged from the prior study. Negative for acute infarct. Chronic microvascular ischemic changes in the white matter bilaterally. No mass lesion. Vascular: Negative for hyperdense vessel Skull: Left facial fractures, described below.  No skull fracture. Other: None CT MAXILLOFACIAL FINDINGS Osseous: Nondisplaced fracture left zygomatic arch Nondisplaced fracture left lateral orbit No orbital floor fracture.  No other facial fracture. Orbits: Bilateral cataract surgery. Large left periorbital hematoma. No hematoma within the orbit. Sinuses: Osteoma left frontal sinus. Mild mucosal edema paranasal sinuses. No air-fluid level. Soft tissues: Large left periorbital soft tissue hematoma. IMPRESSION: 1. 4 mm acute subdural hematoma around the left hemisphere without significant change in midline shift compared with 2019 CT. Small amount of subarachnoid hemorrhage left sylvian fissure. 2. Atrophy and chronic microvascular ischemia 3. Nondisplaced fracture left zygomatic arch and left lateral orbit. Large left periorbital hematoma. 4.  These results were called by telephone at the time of interpretation on 09/20/2019 at 5:24 pm to provider Kathrynn Humble , who verbally acknowledged these results. Electronically Signed   By: Franchot Gallo M.D.   On: 09/20/2019 17:24   Ct Maxillofacial Wo Contrast  Result Date: 09/20/2019 CLINICAL DATA:  Fall.  Facial injury EXAM: CT HEAD WITHOUT CONTRAST CT MAXILLOFACIAL WITHOUT CONTRAST TECHNIQUE: Multidetector CT imaging of the head and maxillofacial structures were performed using the standard protocol without intravenous contrast. Multiplanar CT image reconstructions of the maxillofacial structures were also generated. COMPARISON:  CT head 12/20/2017 FINDINGS: CT HEAD FINDINGS Brain: Moderate atrophy. Acute subdural hematoma on the left measuring up to 4 mm in thickness. Small area of subarachnoid hemorrhage in the left sylvian fissure. 3 mm midline shift to the right is unchanged from the prior study. Negative for acute infarct. Chronic microvascular ischemic changes in the white matter bilaterally. No mass lesion. Vascular: Negative for hyperdense vessel Skull: Left facial fractures, described below.  No skull fracture. Other: None CT MAXILLOFACIAL FINDINGS Osseous: Nondisplaced fracture left zygomatic arch Nondisplaced fracture left lateral orbit No orbital floor fracture.  No other facial fracture. Orbits: Bilateral cataract surgery. Large left periorbital hematoma. No hematoma within the orbit. Sinuses: Osteoma left frontal sinus. Mild mucosal edema paranasal sinuses. No air-fluid level. Soft tissues: Large left periorbital soft tissue hematoma. IMPRESSION: 1. 4 mm acute subdural hematoma around the left hemisphere without significant change in midline shift compared with 2019 CT. Small amount of subarachnoid hemorrhage left sylvian fissure. 2. Atrophy and chronic microvascular ischemia 3. Nondisplaced fracture left zygomatic arch and left lateral orbit. Large left periorbital hematoma. 4. These results  were called by telephone at the time of interpretation on 09/20/2019 at 5:24 pm to provider Kathrynn Humble , who verbally acknowledged these results. Electronically Signed   By: Franchot Gallo M.D.   On: 09/20/2019 17:24    Procedures Procedures (including critical care time)  Medications Ordered in ED Medications - No data to display   Initial Impression / Assessment and Plan / ED Course  I have reviewed the triage vital signs and the nursing notes.  Pertinent labs & imaging results that were available during my care of the patient were reviewed by me and considered in my medical decision making (see chart for details).       Bleeding currently controlled with direct  pressure.  Patient discussed with and seen by Dr. Zenia Resides.  Spoke with Dr. Erik Obey for maxillofacial. He has reviewed CT scans. Advised patient could be started on amoxicillin or keflex, and follow up in the office in one week.  Spoke with neurosurgery. Will see patient. Patient will need admission for repeat CT in the morning. Admit to hospitalist.   Final Clinical Impressions(s) / ED Diagnoses   Final diagnoses:  Subdural hematoma ([place])  Closed fracture of left orbit, initial encounter [facility])  Closed fracture of left zygomatic arch, initial encounter [facility])    ED Discharge Orders    None       Etta Quill, NP 09/20/19 2358    Lacretia Leigh, MD 09/23/19 [phone]

## 2019-09-20 NOTE — ED Triage Notes (Signed)
Per EMS-patient was going to PT when she tripped and fell-small lac to left eye brow and hematoma to left face-denies LOC

## 2019-09-20 NOTE — Consult Note (Addendum)
Chief Complaint   Chief Complaint  Patient presents with  . Fall    HPI   Consult requested by: Etta Quill, NP, [facility] EDP Reason for consult: SDH, SAH  HPI: Courtney Shea is a 85 y.o. female with history breast cancer, TIA on ASA and plavix, hypothyroidism, HTN who presented to the ED after an unwitnessed fall. She is amnestic to the event and unable to provide any history. She is present with her husband who did not witness the fall. By report, she was walking out in the parking lot to therapy when she awoke on the ground. She had significant swelling and bruising left eye with active bleeding and was taking to the ED. She underwent head CT and maxillofacial CT which revealed a left hemispheric SDH and facial fractures. NSY consultation requested for SDH.   She complains of left sided facial pain and headache. Left eye swollen shut. No right eye visual changes. No N/V, dizziness. Takes ASA and plavix daily due to history of TIA. No history of CVA/MI.  Patient Active Problem List   Diagnosis Date Noted  . Malignant neoplasm of upper-inner quadrant of left breast in female, estrogen receptor positive ([place]) 07/14/2017  . Osteopenia 11/07/2015  . Carcinoma of upper-inner quadrant of left female breast ([place]) 07/03/2015  . TIA (transient ischemic attack) 06/20/2013  . Hypokalemia 06/20/2013  . Ventricular bigeminy 12/05/2011  . Leukocytosis 12/05/2011  . Syncope 12/04/2011  . Hypothyroidism 12/04/2011  . HTN (hypertension) 12/04/2011  . UTI (lower urinary tract infection) 12/04/2011  . PAIN IN LIMB 04/02/2007    PMH: Past Medical History:  Diagnosis Date  . Breast cancer ([place])   . Breast cancer of lower-outer quadrant of right female breast ([place]) 05/07/2015  . GERD (gastroesophageal reflux disease)   . Hypertension   . Hypothyroid   . Personal history of radiation therapy   . Stroke [facility])    TIA, no deficits, placed on Plavix  . Syncope   . Ventricular bigeminy     PSH:  Past Surgical History:  Procedure Laterality Date  . ABDOMINAL HYSTERECTOMY    . BLADDER SUSPENSION    . BREAST LUMPECTOMY Right    2016  . CATARACT EXTRACTION    . INCONTINENCE SURGERY    . Ovarian cyst removed    . PARTIAL MASTECTOMY WITH NEEDLE LOCALIZATION Bilateral 05/29/2015   Procedure: LEFT BREAST PARTIAL MASTECTOMY WITH NEEDLE LOCALIZATION TIMES 2 AND RIGHT BREAST PARTIAL MASTECTOMY WITH NEEDLE LOCALIZATION;  Surgeon: Erroll Luna, MD;  Location: [place];  Service: General;  Laterality: Bilateral;  . RE-EXCISION OF BREAST LUMPECTOMY Left 06/21/2015   Procedure: LEFT BREAST RE EXCISION OF LUMPECTOMY;  Surgeon: Erroll Luna, MD;  Location: [place];  Service: General;  Laterality: Left;    (Not in a hospital admission)   SH: Social History   Tobacco Use  . Smoking status: Never Smoker  . Smokeless tobacco: Never Used  Substance Use Topics  . Alcohol use: No  . Drug use: No    MEDS: Prior to Admission medications   Medication Sig Start Date End Date Taking? Authorizing Provider  anastrozole (ARIMIDEX) 1 MG tablet TAKE 1 TABLET BY MOUTH EVERY DAY 02/28/19   Magrinat, Virgie Dad, MD  aspirin EC 81 MG tablet Take 81 mg by mouth once.     [provider]  Calcium Carbonate-Vitamin D (CALCIUM 600 + D PO) Take 1 tablet by mouth every evening.     [provider]  clobetasol (TEMOVATE) 0.05 % external solution Apply 1 application topically 2 (two) times daily.  06/28/15   [provider]  cloNIDine (CATAPRES) 0.2 MG tablet Take 0.2 mg by mouth at bedtime. 04/27/15   [provider]  clopidogrel (PLAVIX) 75 MG tablet Take 75 mg by mouth daily.    [provider]  Cranberry 500 MG CAPS Take 500 mg by mouth daily.    [provider]  furosemide (LASIX) 20 MG tablet Take 20 mg by mouth daily.     [provider]  hydrALAZINE (APRESOLINE) 25 MG tablet Take 25 mg by mouth 2 (two) times daily  with a meal. 05/29/18   [provider]  HYDROcodone-acetaminophen (NORCO/VICODIN) 5-325 MG tablet Take 1 tablet by mouth every 4 (four) hours as needed. Patient not taking: Reported on 08/17/2019 01/05/19   Recardo Evangelist, PA-C  levothyroxine (SYNTHROID, LEVOTHROID) 88 MCG tablet Take 88 mcg by mouth daily.    [provider]  losartan (COZAAR) 100 MG tablet Take 100 mg by mouth daily.    [provider]  metoprolol succinate (TOPROL-XL) 50 MG 24 hr tablet Take 50 mg by mouth daily. Take with or immediately following a meal.    [provider]  pantoprazole (PROTONIX) 40 MG tablet Take 40 mg by mouth every evening.    [provider]    ALLERGY: Allergies  Allergen Reactions  . Codeine Nausea And Vomiting  . Macrodantin [Nitrofurantoin Macrocrystal] Nausea Only    "extreme nausea"  . Crestor [Rosuvastatin Calcium] Other (See Comments)    "aching in legs"  . Doxycycline Itching and Rash  . Hydrochlorothiazide Rash    "all over body rash"  . Norvasc [Amlodipine Besylate] Swelling    "swelling in ankles"  . Sulfa Antibiotics Nausea And Vomiting    Social History   Tobacco Use  . Smoking status: Never Smoker  . Smokeless tobacco: Never Used  Substance Use Topics  . Alcohol use: No     Family History  Problem Relation Age of Onset  . Coronary artery disease Mother        MI at age 66  . Breast cancer Sister 44  . Kidney disease Brother   . Cancer Sister        colon cancer suggested     ROS   Review of Systems  Constitutional: Negative.   HENT: Negative.   Eyes: Negative.   Respiratory: Negative.   Cardiovascular: Negative.   Gastrointestinal: Negative for nausea and vomiting.  Genitourinary: Negative.   Musculoskeletal: Negative.  Negative for back pain, myalgias and neck pain.  Skin: Negative.   Neurological: Positive for loss of consciousness and headaches. Negative for dizziness, tingling, tremors, sensory change,  speech change, focal weakness, seizures and weakness.    Exam   Vitals:   09/20/19 1835 09/20/19 1900  BP: (!) 194/92 (!) 177/93  Pulse: 79 72  Resp: 13 14  Temp:    SpO2: 99% 99%   General appearance: elderly female, resting comfortable, left periorbital swelling/bruising. Left eye swollen shut. GCS: [id number] Eyes: No scleral injection Cardiovascular: Regular rate and rhythm without murmurs, rubs, gallops. No edema or variciosities. Distal pulses normal. Pulmonary: Effort normal, non-labored breathing Musculoskeletal:     Muscle tone upper extremities: Normal    Muscle tone lower extremities: Normal    Motor exam: Upper Extremities Deltoid Bicep Tricep Grip  Right 5/5 5/5 5/5 5/5  Left 5/5 5/5 5/5 5/5   Lower Extremity IP Quad PF  DF EHL  Right 5/5 5/5 5/5 5/5 5/5  Left 5/5 5/5 5/5 5/5 5/5   Neurological Mental Status:    - Patient is awake, alert, oriented to person, place, month, year, and situation    - Patient is amnestic to the event    - No signs of aphasia or neglect Cranial Nerves    - II: Visual Fields are full. PERRL    - III/IV/VI: EOMI without ptosis or diploplia.     - V: Facial sensation is grossly normal    - VII: Facial movement is symmetric.     - VIII: hearing is intact to voice    - X: Uvula elevates symmetrically    - XI: Shoulder shrug is symmetric.    - XII: tongue is midline without atrophy or fasciculations.  Sensory: Sensation grossly intact to LT Plantars   - Toes are downgoing bilaterally.  Cerebellar    - FNF and HKS are intact bilaterally   Results - Imaging/Labs   No results found for this or any previous visit (from the past 48 hour(s)).  Ct Head Wo Contrast  Result Date: 09/20/2019 CLINICAL DATA:  Fall.  Facial injury EXAM: CT HEAD WITHOUT CONTRAST CT MAXILLOFACIAL WITHOUT CONTRAST TECHNIQUE: Multidetector CT imaging of the head and maxillofacial structures were performed using the standard protocol without intravenous contrast.  Multiplanar CT image reconstructions of the maxillofacial structures were also generated. COMPARISON:  CT head 12/20/2017 FINDINGS: CT HEAD FINDINGS Brain: Moderate atrophy. Acute subdural hematoma on the left measuring up to 4 mm in thickness. Small area of subarachnoid hemorrhage in the left sylvian fissure. 3 mm midline shift to the right is unchanged from the prior study. Negative for acute infarct. Chronic microvascular ischemic changes in the white matter bilaterally. No mass lesion. Vascular: Negative for hyperdense vessel Skull: Left facial fractures, described below.  No skull fracture. Other: None CT MAXILLOFACIAL FINDINGS Osseous: Nondisplaced fracture left zygomatic arch Nondisplaced fracture left lateral orbit No orbital floor fracture.  No other facial fracture. Orbits: Bilateral cataract surgery. Large left periorbital hematoma. No hematoma within the orbit. Sinuses: Osteoma left frontal sinus. Mild mucosal edema paranasal sinuses. No air-fluid level. Soft tissues: Large left periorbital soft tissue hematoma. IMPRESSION: 1. 4 mm acute subdural hematoma around the left hemisphere without significant change in midline shift compared with 2019 CT. Small amount of subarachnoid hemorrhage left sylvian fissure. 2. Atrophy and chronic microvascular ischemia 3. Nondisplaced fracture left zygomatic arch and left lateral orbit. Large left periorbital hematoma. 4. These results were called by telephone at the time of interpretation on 09/20/2019 at 5:24 pm to provider Kathrynn Humble , who verbally acknowledged these results. Electronically Signed   By: Franchot Gallo M.D.   On: 09/20/2019 17:24   Ct Maxillofacial Wo Contrast  Result Date: 09/20/2019 CLINICAL DATA:  Fall.  Facial injury EXAM: CT HEAD WITHOUT CONTRAST CT MAXILLOFACIAL WITHOUT CONTRAST TECHNIQUE: Multidetector CT imaging of the head and maxillofacial structures were performed using the standard protocol without intravenous contrast. Multiplanar CT  image reconstructions of the maxillofacial structures were also generated. COMPARISON:  CT head 12/20/2017 FINDINGS: CT HEAD FINDINGS Brain: Moderate atrophy. Acute subdural hematoma on the left measuring up to 4 mm in thickness. Small area of subarachnoid hemorrhage in the left sylvian fissure. 3 mm midline shift to the right is unchanged from the prior study. Negative for acute infarct. Chronic microvascular ischemic changes in the white matter bilaterally. No mass lesion. Vascular: Negative for hyperdense vessel Skull: Left facial fractures,  described below.  No skull fracture. Other: None CT MAXILLOFACIAL FINDINGS Osseous: Nondisplaced fracture left zygomatic arch Nondisplaced fracture left lateral orbit No orbital floor fracture.  No other facial fracture. Orbits: Bilateral cataract surgery. Large left periorbital hematoma. No hematoma within the orbit. Sinuses: Osteoma left frontal sinus. Mild mucosal edema paranasal sinuses. No air-fluid level. Soft tissues: Large left periorbital soft tissue hematoma. IMPRESSION: 1. 4 mm acute subdural hematoma around the left hemisphere without significant change in midline shift compared with 2019 CT. Small amount of subarachnoid hemorrhage left sylvian fissure. 2. Atrophy and chronic microvascular ischemia 3. Nondisplaced fracture left zygomatic arch and left lateral orbit. Large left periorbital hematoma. 4. These results were called by telephone at the time of interpretation on 09/20/2019 at 5:24 pm to provider Kathrynn Humble , who verbally acknowledged these results. Electronically Signed   By: Franchot Gallo M.D.   On: 09/20/2019 17:24    Impression/Plan   85 y.o. female with small left hemispheric SDH, trace SAH and multiple facial fractures after a mechanical fall.Left periorbital swelling complicates visual testing, although appears grossly neuro intact.  79mm Left SDH, trace SAH - no mass effect, no hydrocephalus. No role for NS intervention - Rec admission for  observation and repeat head CT in am. - Keppra 500mg  BID x7days for seizure prophylaxis - Neuro check q 2 hours. Step down unit - Okay to transfer to [facility] for monitoring, although not necessary - d/c asa and plavix  Left zygomatic arch fracture, left lateral orbital fracture: per ENT  On long term dual-antiplatelet therapy - CBC pending - transfuse 1 unit platelets - d/c asa and plavix  Ferne Reus, PA-C [facility]

## 2019-09-20 NOTE — ED Notes (Signed)
Shanon Brow, NP at bedside.

## 2019-09-20 NOTE — ED Notes (Signed)
Attempted to call patients cell phone that spouse has. No answer.

## 2019-09-20 NOTE — ED Provider Notes (Signed)
Medical screening examination/treatment/procedure(s) were conducted as a shared visit with non-physician practitioner(s) and myself.  I personally evaluated the patient during the encounter.    85 year old female here for mechanical fall and she struck the left side of her head.  Patient has evidence of a subdural as well as a facial laceration.  Will consult neurosurgery and admit   Lacretia Leigh, MD 09/20/19 1812

## 2019-09-20 NOTE — ED Notes (Signed)
Attempted to call daughter and spouse. No answer.

## 2019-09-21 ENCOUNTER — Other Ambulatory Visit: Payer: Self-pay | Admitting: *Deleted

## 2019-09-21 ENCOUNTER — Other Ambulatory Visit: Payer: Self-pay

## 2019-09-21 ENCOUNTER — Observation Stay (HOSPITAL_COMMUNITY): Payer: Medicare Other

## 2019-09-21 ENCOUNTER — Encounter (HOSPITAL_COMMUNITY): Payer: Self-pay | Admitting: Internal Medicine

## 2019-09-21 DIAGNOSIS — S0285XA Fracture of orbit, unspecified, initial encounter for closed fracture: Secondary | ICD-10-CM | POA: Insufficient documentation

## 2019-09-21 DIAGNOSIS — Z7902 Long term (current) use of antithrombotics/antiplatelets: Secondary | ICD-10-CM | POA: Diagnosis not present

## 2019-09-21 DIAGNOSIS — I10 Essential (primary) hypertension: Secondary | ICD-10-CM | POA: Diagnosis present

## 2019-09-21 DIAGNOSIS — M8580 Other specified disorders of bone density and structure, unspecified site: Secondary | ICD-10-CM | POA: Diagnosis present

## 2019-09-21 DIAGNOSIS — Z885 Allergy status to narcotic agent status: Secondary | ICD-10-CM | POA: Diagnosis not present

## 2019-09-21 DIAGNOSIS — Z79811 Long term (current) use of aromatase inhibitors: Secondary | ICD-10-CM | POA: Diagnosis not present

## 2019-09-21 DIAGNOSIS — S065XAA Traumatic subdural hemorrhage with loss of consciousness status unknown, initial encounter: Secondary | ICD-10-CM | POA: Diagnosis present

## 2019-09-21 DIAGNOSIS — Z882 Allergy status to sulfonamides status: Secondary | ICD-10-CM | POA: Diagnosis not present

## 2019-09-21 DIAGNOSIS — S066X0A Traumatic subarachnoid hemorrhage without loss of consciousness, initial encounter: Secondary | ICD-10-CM | POA: Diagnosis present

## 2019-09-21 DIAGNOSIS — C50511 Malignant neoplasm of lower-outer quadrant of right female breast: Secondary | ICD-10-CM

## 2019-09-21 DIAGNOSIS — Z90710 Acquired absence of both cervix and uterus: Secondary | ICD-10-CM | POA: Diagnosis not present

## 2019-09-21 DIAGNOSIS — Z853 Personal history of malignant neoplasm of breast: Secondary | ICD-10-CM | POA: Diagnosis not present

## 2019-09-21 DIAGNOSIS — Z881 Allergy status to other antibiotic agents status: Secondary | ICD-10-CM | POA: Diagnosis not present

## 2019-09-21 DIAGNOSIS — Z66 Do not resuscitate: Secondary | ICD-10-CM | POA: Diagnosis present

## 2019-09-21 DIAGNOSIS — Z923 Personal history of irradiation: Secondary | ICD-10-CM | POA: Diagnosis not present

## 2019-09-21 DIAGNOSIS — Z9013 Acquired absence of bilateral breasts and nipples: Secondary | ICD-10-CM | POA: Diagnosis not present

## 2019-09-21 DIAGNOSIS — W010XXA Fall on same level from slipping, tripping and stumbling without subsequent striking against object, initial encounter: Secondary | ICD-10-CM | POA: Diagnosis present

## 2019-09-21 DIAGNOSIS — I493 Ventricular premature depolarization: Secondary | ICD-10-CM | POA: Diagnosis present

## 2019-09-21 DIAGNOSIS — S065X0A Traumatic subdural hemorrhage without loss of consciousness, initial encounter: Secondary | ICD-10-CM | POA: Diagnosis present

## 2019-09-21 DIAGNOSIS — S0240FA Zygomatic fracture, left side, initial encounter for closed fracture: Secondary | ICD-10-CM | POA: Diagnosis present

## 2019-09-21 DIAGNOSIS — S065X9A Traumatic subdural hemorrhage with loss of consciousness of unspecified duration, initial encounter: Secondary | ICD-10-CM | POA: Diagnosis not present

## 2019-09-21 DIAGNOSIS — Z803 Family history of malignant neoplasm of breast: Secondary | ICD-10-CM | POA: Diagnosis not present

## 2019-09-21 DIAGNOSIS — Z888 Allergy status to other drugs, medicaments and biological substances status: Secondary | ICD-10-CM | POA: Diagnosis not present

## 2019-09-21 DIAGNOSIS — Z8249 Family history of ischemic heart disease and other diseases of the circulatory system: Secondary | ICD-10-CM | POA: Diagnosis not present

## 2019-09-21 DIAGNOSIS — Z8673 Personal history of transient ischemic attack (TIA), and cerebral infarction without residual deficits: Secondary | ICD-10-CM | POA: Diagnosis not present

## 2019-09-21 DIAGNOSIS — Z20828 Contact with and (suspected) exposure to other viral communicable diseases: Secondary | ICD-10-CM | POA: Diagnosis present

## 2019-09-21 DIAGNOSIS — K219 Gastro-esophageal reflux disease without esophagitis: Secondary | ICD-10-CM | POA: Diagnosis present

## 2019-09-21 DIAGNOSIS — E039 Hypothyroidism, unspecified: Secondary | ICD-10-CM | POA: Diagnosis present

## 2019-09-21 DIAGNOSIS — S02842A Fracture of lateral orbital wall, left side, initial encounter for closed fracture: Secondary | ICD-10-CM | POA: Diagnosis present

## 2019-09-21 DIAGNOSIS — Z170 Estrogen receptor positive status [ER+]: Secondary | ICD-10-CM

## 2019-09-21 LAB — BPAM PLATELET PHERESIS
Blood Product Expiration Date: 202011252359
ISSUE DATE / TIME: 202011242100
Unit Type and Rh: 6200

## 2019-09-21 LAB — CBC WITH DIFFERENTIAL/PLATELET
Abs Immature Granulocytes: 0.1 10*3/uL — ABNORMAL HIGH (ref 0.00–0.07)
Basophils Absolute: 0 10*3/uL (ref 0.0–0.1)
Basophils Relative: 0 %
Eosinophils Absolute: 0 10*3/uL (ref 0.0–0.5)
Eosinophils Relative: 0 %
HCT: 32.8 % — ABNORMAL LOW (ref 36.0–46.0)
Hemoglobin: 10.8 g/dL — ABNORMAL LOW (ref 12.0–15.0)
Immature Granulocytes: 1 %
Lymphocytes Relative: 10 %
Lymphs Abs: 1.6 10*3/uL (ref 0.7–4.0)
MCH: 30 pg (ref 26.0–34.0)
MCHC: 32.9 g/dL (ref 30.0–36.0)
MCV: 91.1 fL (ref 80.0–100.0)
Monocytes Absolute: 1.2 10*3/uL — ABNORMAL HIGH (ref 0.1–1.0)
Monocytes Relative: 8 %
Neutro Abs: 12.2 10*3/uL — ABNORMAL HIGH (ref 1.7–7.7)
Neutrophils Relative %: 81 %
Platelets: 269 10*3/uL (ref 150–400)
RBC: 3.6 MIL/uL — ABNORMAL LOW (ref 3.87–5.11)
RDW: 12.7 % (ref 11.5–15.5)
WBC: 15.2 10*3/uL — ABNORMAL HIGH (ref 4.0–10.5)
nRBC: 0 % (ref 0.0–0.2)

## 2019-09-21 LAB — PREPARE PLATELET PHERESIS: Unit division: 0

## 2019-09-21 LAB — URINALYSIS, ROUTINE W REFLEX MICROSCOPIC
Bilirubin Urine: NEGATIVE
Glucose, UA: NEGATIVE mg/dL
Ketones, ur: NEGATIVE mg/dL
Nitrite: NEGATIVE
Protein, ur: NEGATIVE mg/dL
Specific Gravity, Urine: 1.014 (ref 1.005–1.030)
pH: 7 (ref 5.0–8.0)

## 2019-09-21 LAB — PROTIME-INR
INR: 1.1 (ref 0.8–1.2)
Prothrombin Time: 13.9 seconds (ref 11.4–15.2)

## 2019-09-21 LAB — SARS CORONAVIRUS 2 (TAT 6-24 HRS): SARS Coronavirus 2: NEGATIVE

## 2019-09-21 LAB — TYPE AND SCREEN
ABO/RH(D): A POS
Antibody Screen: NEGATIVE

## 2019-09-21 LAB — ABO/RH: ABO/RH(D): A POS

## 2019-09-21 MED ORDER — SODIUM CHLORIDE 0.9 % IV SOLN
INTRAVENOUS | Status: AC
  Administered 2019-09-21 (×2): via INTRAVENOUS

## 2019-09-21 MED ORDER — HYDRALAZINE HCL 10 MG PO TABS
25.0000 mg | ORAL_TABLET | Freq: Two times a day (BID) | ORAL | Status: DC
  Administered 2019-09-21 – 2019-09-22 (×2): 25 mg via ORAL
  Filled 2019-09-21 (×2): qty 3

## 2019-09-21 MED ORDER — METOPROLOL SUCCINATE ER 50 MG PO TB24
50.0000 mg | ORAL_TABLET | Freq: Every day | ORAL | Status: DC
  Administered 2019-09-22: 10:00:00 50 mg via ORAL
  Filled 2019-09-21 (×2): qty 1

## 2019-09-21 MED ORDER — ACETAMINOPHEN 650 MG RE SUPP: 650.0000 mg | Freq: Four times a day (QID) | RECTAL | Status: DC | PRN

## 2019-09-21 MED ORDER — ANASTROZOLE 1 MG PO TABS
1.0000 mg | ORAL_TABLET | Freq: Every day | ORAL | Status: DC
  Administered 2019-09-21 – 2019-09-22 (×2): 1 mg via ORAL
  Filled 2019-09-21 (×2): qty 1

## 2019-09-21 MED ORDER — LEVOTHYROXINE SODIUM 100 MCG PO TABS
100.0000 ug | ORAL_TABLET | Freq: Every day | ORAL | Status: DC
  Administered 2019-09-21 – 2019-09-22 (×2): 100 ug via ORAL
  Filled 2019-09-21 (×2): qty 1

## 2019-09-21 MED ORDER — LOSARTAN POTASSIUM 50 MG PO TABS
100.0000 mg | ORAL_TABLET | Freq: Every day | ORAL | Status: DC
  Administered 2019-09-22: 100 mg via ORAL
  Filled 2019-09-21: qty 2

## 2019-09-21 MED ORDER — ONDANSETRON HCL 4 MG/2ML IJ SOLN: 4.0000 mg | Freq: Four times a day (QID) | INTRAMUSCULAR | Status: DC | PRN

## 2019-09-21 MED ORDER — ONDANSETRON HCL 4 MG PO TABS: 4.0000 mg | ORAL_TABLET | Freq: Four times a day (QID) | ORAL | Status: DC | PRN

## 2019-09-21 MED ORDER — PANTOPRAZOLE SODIUM 40 MG PO TBEC
40.0000 mg | DELAYED_RELEASE_TABLET | Freq: Every evening | ORAL | Status: DC
  Administered 2019-09-21: 18:00:00 40 mg via ORAL
  Filled 2019-09-21: qty 1

## 2019-09-21 MED ORDER — ACETAMINOPHEN 325 MG PO TABS
650.0000 mg | ORAL_TABLET | Freq: Four times a day (QID) | ORAL | Status: DC | PRN
  Administered 2019-09-21 (×2): 650 mg via ORAL
  Filled 2019-09-21 (×2): qty 2

## 2019-09-21 MED ORDER — CLONIDINE HCL 0.1 MG PO TABS
0.3000 mg | ORAL_TABLET | Freq: Every day | ORAL | Status: DC
  Administered 2019-09-21 (×2): 0.3 mg via ORAL
  Filled 2019-09-21 (×2): qty 3

## 2019-09-21 MED ORDER — ANASTROZOLE 1 MG PO TABS: 1.0000 mg | ORAL_TABLET | Freq: Every day | ORAL | 3 refills | Status: DC

## 2019-09-21 MED ORDER — SODIUM CHLORIDE 0.9 % IV SOLN
2.0000 g | Freq: Every day | INTRAVENOUS | Status: DC
  Administered 2019-09-21 – 2019-09-22 (×2): 2 g via INTRAVENOUS
  Filled 2019-09-21 (×2): qty 2

## 2019-09-21 NOTE — ED Notes (Signed)
[facility] dispatch notified for need of transport.  

## 2019-09-21 NOTE — Progress Notes (Addendum)
Subjective: Patient reports "I feel okay"  Objective: Vital signs in last 24 hours: Temp:  [97.9 F (36.6 C)-99.1 F (37.3 C)] 97.9 F (36.6 C) (11/25 0757) Pulse Rate:  [66-100] 67 (11/25 0757) Resp:  [13-21] 16 (11/25 0757) BP: (92-194)/(52-94) 92/52 (11/25 0757) SpO2:  [90 %-100 %] 95 % (11/25 0757) Weight:  [72.8 kg] 72.8 kg (11/25 0321)  Intake/Output from previous day: 11/24 0701 - 11/25 0700 In: 762.4 [I.V.:364.4; Blood:398] Out: -  Intake/Output this shift: Total I/O In: -  Out: 350 [Urine:350]  Awakens to voice.   Denies pain or discomfort at present.  Moving all extremities well.  No drift.   Bruising left orbit with left eye swollen shut,  nontender.   Lab Results: Recent Labs    09/21/19 0052  WBC 15.2*  HGB 10.8*  HCT 32.8*  PLT 269   BMET Recent Labs    09/20/19 1847  NA 136  K 3.8  CL 100  CO2 24  GLUCOSE 114*  BUN 20  CREATININE 0.73  CALCIUM 9.6    Studies/Results: Ct Head Wo Contrast  Result Date: 09/21/2019 CLINICAL DATA:  Subdural hemorrhage EXAM: CT HEAD WITHOUT CONTRAST TECHNIQUE: Contiguous axial images were obtained from the base of the skull through the vertex without intravenous contrast. COMPARISON:  CT head 09/20/2019 FINDINGS: Brain: Trace left subdural hematoma along the left frontotemporal convexity again measures up to 4 mm in maximal thickness (coronal 5/38). Small amount of subarachnoid hemorrhage is seen within the left sylvian fissure. Stable mild 3 mm rightward midline shift. No new sites of hemorrhage or extra-axial collection. No CT evidence of large territory infarct. Chronic microvascular ischemic changes and parenchymal volume loss is similar to prior including slight asymmetry of the lateral ventricles. No mass lesion. Vascular: Atherosclerotic calcification of the carotid siphons. No hyperdense vessel. Skull: No calvarial fracture or suspicious osseous lesion. No scalp swelling or hematoma. Nondisplaced fractures of the  left lateral orbital wall and zygomatic arch. Extensive left frontal, periorbital and malar soft tissue swelling and hematoma. 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 Left periorbital hematoma. No intraorbital hematoma. Other: None IMPRESSION: Stable 4 mm subdural hematoma along the left frontotemporal convexity without significant change from comparison CT. No new sites of hemorrhage the or other significant interval change. Extensive left frontal scalp and facial swelling. Redemonstrated fractures of the left lateral orbit and left zygomatic arch. Electronically Signed   By: Lovena Le M.D.   On: 09/21/2019 04:08   Ct Head Wo Contrast  Result Date: 09/20/2019 CLINICAL DATA:  Fall.  Facial injury EXAM: CT HEAD WITHOUT CONTRAST CT MAXILLOFACIAL WITHOUT CONTRAST TECHNIQUE: Multidetector CT imaging of the head and maxillofacial structures were performed using the standard protocol without intravenous contrast. Multiplanar CT image reconstructions of the maxillofacial structures were also generated. COMPARISON:  CT head 12/20/2017 FINDINGS: CT HEAD FINDINGS Brain: Moderate atrophy. Acute subdural hematoma on the left measuring up to 4 mm in thickness. Small area of subarachnoid hemorrhage in the left sylvian fissure. 3 mm midline shift to the right is unchanged from the prior study. Negative for acute infarct. Chronic microvascular ischemic changes in the white matter bilaterally. No mass lesion. Vascular: Negative for hyperdense vessel Skull: Left facial fractures, described below.  No skull fracture. Other: None CT MAXILLOFACIAL FINDINGS Osseous: Nondisplaced fracture left zygomatic arch Nondisplaced fracture left lateral orbit No orbital floor fracture.  No other facial fracture. Orbits: Bilateral cataract surgery. Large left periorbital hematoma. No hematoma within the orbit. Sinuses: Osteoma  left frontal sinus. Mild mucosal edema paranasal sinuses. No air-fluid level. Soft tissues: Large left  periorbital soft tissue hematoma. IMPRESSION: 1. 4 mm acute subdural hematoma around the left hemisphere without significant change in midline shift compared with 2019 CT. Small amount of subarachnoid hemorrhage left sylvian fissure. 2. Atrophy and chronic microvascular ischemia 3. Nondisplaced fracture left zygomatic arch and left lateral orbit. Large left periorbital hematoma. 4. These results were called by telephone at the time of interpretation on 09/20/2019 at 5:24 pm to provider Kathrynn Humble , who verbally acknowledged these results. Electronically Signed   By: Franchot Gallo M.D.   On: 09/20/2019 17:24   Ct Cervical Spine Wo Contrast  Result Date: 09/20/2019 CLINICAL DATA:  Fall. Facial fracture. EXAM: CT CERVICAL SPINE WITHOUT CONTRAST TECHNIQUE: Multidetector CT imaging of the cervical spine was performed without intravenous contrast. Multiplanar CT image reconstructions were also generated. COMPARISON:  None. FINDINGS: Alignment: No static subluxation. Facets are aligned. Occipital condyles and the lateral masses of C1 and C2 are normally approximated. Skull base and vertebrae: No acute fracture. Soft tissues and spinal canal: No prevertebral fluid or swelling. No visible canal hematoma. Disc levels: Multilevel lower cervical degenerative disc disease. No bony spinal canal stenosis. Multilevel facet arthrosis, left worse than right. Upper chest: No pneumothorax, pulmonary nodule or pleural effusion. Other: Normal visualized paraspinal cervical soft tissues. IMPRESSION: No acute fracture or static subluxation of the cervical spine. Electronically Signed   By: Ulyses Jarred M.D.   On: 09/20/2019 20:10   Dg Chest Port 1 View  Result Date: 09/20/2019 CLINICAL DATA:  Pain following fall EXAM: PORTABLE CHEST 1 VIEW COMPARISON:  April 01, 2008 FINDINGS: There is slight left base atelectasis. The lungs elsewhere are clear. Heart is upper normal in size with pulmonary vascularity normal. No adenopathy. No  pneumothorax. There is an impacted fracture of the proximal left humerus, age uncertain. There are surgical clips in the left breast region. IMPRESSION: Slight left base atelectasis. No edema or consolidation. Heart upper normal in size. Age uncertain impaction fracture proximal left humeral metaphysis. This finding warrants correlation with dedicated shoulder radiographs to further evaluate. Electronically Signed   By: Lowella Grip III M.D.   On: 09/20/2019 19:44   Dg Shoulder Left  Result Date: 09/20/2019 CLINICAL DATA:  Evaluate left shoulder fracture seen on recent chest x-ray EXAM: LEFT SHOULDER - 2+ VIEW COMPARISON:  Chest x-ray from earlier in the same day as well as prior shoulder x-ray from 01/05/2019 FINDINGS: Proximal humeral fracture is again identified involving primarily the surgical neck with impaction at the fracture site. The overall appearance demonstrates increased impaction when compare with the prior exam from 01/05/2019 when the fracture was originally diagnosed. No dislocation is seen. The underlying bony thorax is within normal limits. IMPRESSION: Increasing impaction at the site of the surgical neck fracture when compared with the prior exam from 01/05/2019 when it was originally diagnosed. Electronically Signed   By: Inez Catalina M.D.   On: 09/20/2019 21:17   Ct Maxillofacial Wo Contrast  Result Date: 09/20/2019 CLINICAL DATA:  Fall.  Facial injury EXAM: CT HEAD WITHOUT CONTRAST CT MAXILLOFACIAL WITHOUT CONTRAST TECHNIQUE: Multidetector CT imaging of the head and maxillofacial structures were performed using the standard protocol without intravenous contrast. Multiplanar CT image reconstructions of the maxillofacial structures were also generated. COMPARISON:  CT head 12/20/2017 FINDINGS: CT HEAD FINDINGS Brain: Moderate atrophy. Acute subdural hematoma on the left measuring up to 4 mm in thickness. Small area of subarachnoid hemorrhage  in the left sylvian fissure. 3 mm  midline shift to the right is unchanged from the prior study. Negative for acute infarct. Chronic microvascular ischemic changes in the white matter bilaterally. No mass lesion. Vascular: Negative for hyperdense vessel Skull: Left facial fractures, described below.  No skull fracture. Other: None CT MAXILLOFACIAL FINDINGS Osseous: Nondisplaced fracture left zygomatic arch Nondisplaced fracture left lateral orbit No orbital floor fracture.  No other facial fracture. Orbits: Bilateral cataract surgery. Large left periorbital hematoma. No hematoma within the orbit. Sinuses: Osteoma left frontal sinus. Mild mucosal edema paranasal sinuses. No air-fluid level. Soft tissues: Large left periorbital soft tissue hematoma. IMPRESSION: 1. 4 mm acute subdural hematoma around the left hemisphere without significant change in midline shift compared with 2019 CT. Small amount of subarachnoid hemorrhage left sylvian fissure. 2. Atrophy and chronic microvascular ischemia 3. Nondisplaced fracture left zygomatic arch and left lateral orbit. Large left periorbital hematoma. 4. These results were called by telephone at the time of interpretation on 09/20/2019 at 5:24 pm to provider Kathrynn Humble , who verbally acknowledged these results. Electronically Signed   By: Franchot Gallo M.D.   On: 09/20/2019 17:24    Assessment/Plan: stable  LOS: 0 days  Dr. Vertell Limber will review this morning's repeat CT.    Verdis Prime 09/21/2019, 8:23 AM   Head CT is stable.  This will not require neurosurgical intervention.  Mobilize with PT.  Hold plavix.  OK to discharge home when doing OK.  Follow up in office with head CT in one month.

## 2019-09-21 NOTE — Progress Notes (Signed)
Patient arrived on the unit via [facility]. Patient was assessed and tranported to CT.

## 2019-09-21 NOTE — Evaluation (Addendum)
Physical Therapy Evaluation Patient Details Name: Courtney Shea MRN: [id number] DOB: Aug 11, 1930 Today's Date: 09/21/2019   History of Present Illness  85 y.o. female admitted on 09/20/19 for fall with resultant L scalp hematoma and CT scan revealed 63mm subdural hematoma, small amount of SAH and L lateral orbit and non-displaced zygomatic arch fx.  Pt with significant PMH of syncope, stroke, THN, breast CA (with partial mastectomy bil).    Clinical Impression  Pt was able to get up and move about the room with RW and min guard assist.  She has no reports of dizziness or lightheadedness, mild HA.  Pt reports h/o worsening balance this past year and was active with OP PT at [facility].  She is eager to return to therapy to help her balance and gait and has admitted that she will likely need a RW full time now.  She is interested in trying a rollator and I asked her to practice with a therapist before committing to purchasing one as it is a bit harder to control than a regular RW. PT will continue to follow acutely for safe mobility progression. Orthostatic vitals are taken and negative (see below).     09/21/19 1129  Vital Signs  Patient Position (if appropriate) Orthostatic Vitals  Orthostatic Lying   BP- Lying 124/85  Pulse- Lying 86  Orthostatic Sitting  BP- Sitting 142/63  Pulse- Sitting 91  Orthostatic Standing at 0 minutes  BP- Standing at 0 minutes 151/73  Pulse- Standing at 0 minutes 95  Orthostatic Standing at 3 minutes  BP- Standing at 3 minutes 150/76  Pulse- Standing at 3 minutes 95  Oxygen Therapy  SpO2 96 %  O2 Device Room Air       Follow Up Recommendations Outpatient PT;Other (comment)(resume OP PT at [facility])    Equipment Recommendations  None recommended by PT    Recommendations for Other Services   NA    Precautions / Restrictions Precautions Precautions: Fall Precaution Comments: h/o falls PTA once in March with L UE fx (non-surgical) and this one       Mobility  Bed Mobility Overal bed mobility: Modified Independent             General bed mobility comments: HOB elevated, used railing.   Transfers Overall transfer level: Needs assistance Equipment used: Rolling walker (2 wheeled) Transfers: Sit to/from Stand Sit to Stand: Min guard         General transfer comment: Min guard assist for safety, pt initally pulling on RW and then switched to one hand on RW one hand on bed unprompted.  Ambulation/Gait Ambulation/Gait assistance: Min guard Gait Distance (Feet): 20 Feet Assistive device: Rolling walker (2 wheeled) Gait Pattern/deviations: Step-through pattern;Trunk flexed Gait velocity: decreased Gait velocity interpretation: 1.31 - 2.62 ft/sec, indicative of limited community ambulator General Gait Details: Pt with flexed trunk posture, cues for staying closer/inside of RW and upright posture.        Balance Overall balance assessment: Needs assistance Sitting-balance support: Feet supported;No upper extremity supported Sitting balance-Leahy Scale: Good     Standing balance support: Bilateral upper extremity supported;Single extremity supported;No upper extremity supported Standing balance-Leahy Scale: Fair Standing balance comment: Pt able to preform tooth brushing, hand washing at sink, but prefers to lean on her left elbow while doing these tasks (flexed posture).  Pt with weak postural muscles.  Pertinent Vitals/Pain Pain Assessment: Faces Faces Pain Scale: Hurts little more Pain Location: left head Pain Descriptors / Indicators: Aching Pain Intervention(s): Limited activity within patient's tolerance;Monitored during session;Repositioned    Home Living Family/patient expects to be discharged to:: Private residence Living Arrangements: Spouse/significant other Available Help at Discharge: Family;Available 24 hours/day Type of Home: House Home Access: Stairs to  enter Entrance Stairs-Rails: None Entrance Stairs-Number of Steps: 1 Home Layout: One level Home Equipment: Walker - 2 wheels;Cane - single point Additional Comments: active with OP PT at [facility] (balance and gait training).     Prior Function Level of Independence: Needs assistance   Gait / Transfers Assistance Needed: Pt walks without AD in home, uses cane for community access  ADL's / Homemaking Assistance Needed: Pt reports independence in ADLs, shared responsitbility of cooking and cleaning, prefers a tub bath, but can no longer get down into the tub.         Hand Dominance   Dominant Hand: Right    Extremity/Trunk Assessment   Upper Extremity Assessment Upper Extremity Assessment: Defer to OT evaluation    Lower Extremity Assessment Lower Extremity Assessment: Generalized weakness    Cervical / Trunk Assessment Cervical / Trunk Assessment: Kyphotic  Communication   Communication: No difficulties  Cognition Arousal/Alertness: Awake/alert Behavior During Therapy: WFL for tasks assessed/performed Overall Cognitive Status: Within Functional Limits for tasks assessed                                 General Comments: Pt reports that she remembers getting out of the car, and then the events surrounding her fall are a bit fuzzy.  Otherwise, cognition appears intact.       General Comments General comments (skin integrity, edema, etc.): Pt reports she realizes now she likely needs to use a RW full time.  She is interested in trying a rollator (seated 4 wheeled RW).  I encouraged her to do this with therapy before purchasing as they are a bit harder to control than a regular RW.          Assessment/Plan    PT Assessment Patient needs continued PT services  PT Problem List Decreased strength;Decreased activity tolerance;Decreased balance;Decreased mobility;Decreased knowledge of use of DME;Decreased knowledge of precautions;Pain       PT Treatment  Interventions DME instruction;Gait training;Stair training;Therapeutic activities;Functional mobility training;Therapeutic exercise;Balance training;Neuromuscular re-education;Cognitive remediation;Patient/family education    PT Goals (Current goals can be found in the Care Plan section)  Acute Rehab PT Goals Patient Stated Goal: to get more balanced on her feet so she won't keep falling PT Goal Formulation: With patient Time For Goal Achievement: 10/05/19 Potential to Achieve Goals: Good    Frequency Min 3X/week           AM-PAC PT "6 Clicks" Mobility  Outcome Measure Help needed turning from your back to your side while in a flat bed without using bedrails?: A Little Help needed moving from lying on your back to sitting on the side of a flat bed without using bedrails?: A Little Help needed moving to and from a bed to a chair (including a wheelchair)?: A Little Help needed standing up from a chair using your arms (e.g., wheelchair or bedside chair)?: A Little Help needed to walk in hospital room?: A Little Help needed climbing 3-5 steps with a railing? : A Little 6 Click Score: 18    End of Session  Activity Tolerance: Patient limited by pain Patient left: in chair;with call bell/phone within reach   PT Visit Diagnosis: Muscle weakness (generalized) (M62.81);Difficulty in walking, not elsewhere classified (R26.2);Repeated falls (R29.6)    Time: [id number] PT Time Calculation (min) (ACUTE ONLY): 32 min   Charges:          Verdene Lennert, PT, DPT  Acute Rehabilitation [phone] pager #[phone] office  @ [facility]: [phone]   PT Evaluation $PT Eval Moderate Complexity: 1 Mod PT Treatments $Gait Training: 8-22 mins        09/21/2019, 11:42 AM

## 2019-09-21 NOTE — Plan of Care (Signed)
Patient stable, discussed POC with patient, agreeable with plan, denies question/concerns at this time.   Problem: Education: Goal: Knowledge of General Education information will improve Description: Including pain rating scale, medication(s)/side effects and non-pharmacologic comfort measures Outcome: Progressing   Problem: Health Behavior/Discharge Planning: Goal: Ability to manage health-related needs will improve Outcome: Progressing   Problem: Clinical Measurements: Goal: Ability to maintain clinical measurements within normal limits will improve Outcome: Progressing Goal: Will remain Kayton Ripp from infection Outcome: Progressing Goal: Diagnostic test results will improve Outcome: Progressing Goal: Respiratory complications will improve Outcome: Progressing Goal: Cardiovascular complication will be avoided Outcome: Progressing   Problem: Activity: Goal: Risk for activity intolerance will decrease Outcome: Progressing   Problem: Nutrition: Goal: Adequate nutrition will be maintained Outcome: Progressing   Problem: Coping: Goal: Level of anxiety will decrease Outcome: Progressing   Problem: Elimination: Goal: Will not experience complications related to bowel motility Outcome: Progressing Goal: Will not experience complications related to urinary retention Outcome: Progressing   Problem: Pain Managment: Goal: General experience of comfort will improve Outcome: Progressing   Problem: Safety: Goal: Ability to remain Ikechukwu Cerny from injury will improve Outcome: Progressing   Problem: Skin Integrity: Goal: Risk for impaired skin integrity will decrease Outcome: Progressing   

## 2019-09-21 NOTE — ED Notes (Signed)
ED TO INPATIENT HANDOFF REPORT  ED Nurse Name and Phone #:   Lysle Dingwall Name/Age/Gender Courtney Shea 85 y.o. female Room/Bed: WA19/WA19  Code Status   Code Status: DNR  Home/SNF/Other Home Patient oriented to: self, place, time and situation Is this baseline? Yes   Triage Complete: Triage complete  Chief Complaint Eye Injury  Triage Note Per EMS-patient was going to PT when she tripped and fell-small lac to left eye brow and hematoma to left face-denies LOC   Allergies Allergies  Allergen Reactions  . Codeine Nausea And Vomiting  . Macrodantin [Nitrofurantoin Macrocrystal] Nausea Only    "extreme nausea"  . Crestor [Rosuvastatin Calcium] Other (See Comments)    "aching in legs"  . Doxycycline Itching and Rash  . Hydrochlorothiazide Rash    "all over body rash"  . Norvasc [Amlodipine Besylate] Swelling    "swelling in ankles"  . Sulfa Antibiotics Nausea And Vomiting    Level of Care/Admitting Diagnosis ED Disposition    ED Disposition Condition [facility] Area: [facility] [100100]  Level of Care: Telemetry Medical [104]  I expect the patient will be discharged within 24 hours: No (not a candidate for 5C-Observation unit)  Covid Evaluation: Asymptomatic Screening Protocol (No Symptoms)  Diagnosis: SDH (subdural hematoma) ([place]) [id number]  Admitting Physician: Rise Patience [phone]  Attending Physician: Rise Patience Lei.Right  PT Class (Do Not Modify): Observation [104]  PT Acc Code (Do Not Modify): Observation [10022]       B Medical/Surgery History Past Medical History:  Diagnosis Date  . Breast cancer ([place])   . Breast cancer of lower-outer quadrant of right female breast ([place]) 05/07/2015  . GERD (gastroesophageal reflux disease)   . Hypertension   . Hypothyroid   . Personal history of radiation therapy   . Stroke [facility])    TIA, no deficits, placed on Plavix  . Syncope   . Ventricular bigeminy     Past Surgical History:  Procedure Laterality Date  . ABDOMINAL HYSTERECTOMY    . BLADDER SUSPENSION    . BREAST LUMPECTOMY Right    2016  . CATARACT EXTRACTION    . INCONTINENCE SURGERY    . Ovarian cyst removed    . PARTIAL MASTECTOMY WITH NEEDLE LOCALIZATION Bilateral 05/29/2015   Procedure: LEFT BREAST PARTIAL MASTECTOMY WITH NEEDLE LOCALIZATION TIMES 2 AND RIGHT BREAST PARTIAL MASTECTOMY WITH NEEDLE LOCALIZATION;  Surgeon: Erroll Luna, MD;  Location: [place];  Service: General;  Laterality: Bilateral;  . RE-EXCISION OF BREAST LUMPECTOMY Left 06/21/2015   Procedure: LEFT BREAST RE EXCISION OF LUMPECTOMY;  Surgeon: Erroll Luna, MD;  Location: [place];  Service: General;  Laterality: Left;     A IV Location/Drains/Wounds Patient Lines/Drains/Airways Status   Active Line/Drains/Airways    Name:   Placement date:   Placement time:   Site:   Days:   Peripheral IV 09/20/19 Left Hand   09/20/19    1905    Hand   1   Peripheral IV 09/20/19 Left Antecubital   09/20/19    2011    Antecubital   1   Incision (Closed) 05/29/15 Breast Right   05/29/15    1348     1576   Incision (Closed) 05/29/15 Breast Left   05/29/15    1348     1576   Incision (Closed) 06/21/15 Breast Left   06/21/15    1626     1553  Intake/Output Last 24 hours  Intake/Output Summary (Last 24 hours) at 09/21/2019 0150 Last data filed at 09/20/2019 2215 Gross per 24 hour  Intake 598 ml  Output -  Net 598 ml    Labs/Imaging Results for orders placed or performed during the hospital encounter of 09/20/19 (from the past 48 hour(s))  Basic metabolic panel     Status: Abnormal   Collection Time: 09/20/19  6:47 PM  Result Value Ref Range   Sodium 136 135 - 145 mmol/L   Potassium 3.8 3.5 - 5.1 mmol/L   Chloride 100 98 - 111 mmol/L   CO2 24 22 - 32 mmol/L   Glucose, Bld 114 (H) 70 - 99 mg/dL   BUN 20 8 - 23 mg/dL   Creatinine, Ser 0.73 0.44 - 1.00 mg/dL   Calcium  9.6 8.9 - 10.3 mg/dL   GFR calc non Af Amer >60 >60 mL/min   GFR calc Af Amer >60 >60 mL/min   Anion gap 12 5 - 15    Comment: Performed at [facility], [place] [address] [place], [place] [address]  ABO/Rh     Status: None   Collection Time: 09/20/19  6:47 PM  Result Value Ref Range   ABO/RH(D)      A POS Performed at [facility], [place] [address], [place], [place] [address]   Type and screen [place]     Status: None   Collection Time: 09/20/19  7:00 PM  Result Value Ref Range   ABO/RH(D) A POS    Antibody Screen NEG    Sample Expiration      09/23/2019,2359 Performed at [facility], [place] [address] [place], [place] [address]   Prepare Pheresed Platelets     Status: None (Preliminary result)   Collection Time: 09/20/19  7:36 PM  Result Value Ref Range   Unit Number [id number]    Blood Component Type PLTP LR2 PAS    Unit division 00    Status of Unit ISSUED    Transfusion Status      OK TO TRANSFUSE Performed at [place] [address] [place], [place] [address]   CBC with Differential/Platelet     Status: Abnormal   Collection Time: 09/21/19 12:52 AM  Result Value Ref Range   WBC 15.2 (H) 4.0 - 10.5 K/uL   RBC 3.60 (L) 3.87 - 5.11 MIL/uL   Hemoglobin 10.8 (L) 12.0 - 15.0 g/dL   HCT 32.8 (L) 36.0 - 46.0 %   MCV 91.1 80.0 - 100.0 fL   MCH 30.0 26.0 - 34.0 pg   MCHC 32.9 30.0 - 36.0 g/dL   RDW 12.7 11.5 - 15.5 %   Platelets 269 150 - 400 K/uL   nRBC 0.0 0.0 - 0.2 %   Neutrophils Relative % 81 %   Neutro Abs 12.2 (H) 1.7 - 7.7 K/uL   Lymphocytes Relative 10 %   Lymphs Abs 1.6 0.7 - 4.0 K/uL   Monocytes Relative 8 %   Monocytes Absolute 1.2 (H) 0.1 - 1.0 K/uL   Eosinophils Relative 0 %   Eosinophils Absolute 0.0 0.0 - 0.5 K/uL   Basophils Relative 0 %   Basophils Absolute 0.0 0.0 - 0.1 K/uL   Immature Granulocytes 1 %   Abs Immature Granulocytes 0.10 (H) 0.00 -  0.07 K/uL    Comment: Performed at [facility], [place] [address] [place],  [address]  Protime-INR  Status: None   Collection Time: 09/21/19 12:52 AM  Result Value Ref Range   Prothrombin Time 13.9 11.4 - 15.2 seconds   INR 1.1 0.8 - 1.2    Comment: (NOTE) INR goal varies based on device and disease states. Performed at [facility], [place] [address], [place], [place] [address]    Ct Head Wo Contrast  Result Date: 09/20/2019 CLINICAL DATA:  Fall.  Facial injury EXAM: CT HEAD WITHOUT CONTRAST CT MAXILLOFACIAL WITHOUT CONTRAST TECHNIQUE: Multidetector CT imaging of the head and maxillofacial structures were performed using the standard protocol without intravenous contrast. Multiplanar CT image reconstructions of the maxillofacial structures were also generated. COMPARISON:  CT head 12/20/2017 FINDINGS: CT HEAD FINDINGS Brain: Moderate atrophy. Acute subdural hematoma on the left measuring up to 4 mm in thickness. Small area of subarachnoid hemorrhage in the left sylvian fissure. 3 mm midline shift to the right is unchanged from the prior study. Negative for acute infarct. Chronic microvascular ischemic changes in the white matter bilaterally. No mass lesion. Vascular: Negative for hyperdense vessel Skull: Left facial fractures, described below.  No skull fracture. Other: None CT MAXILLOFACIAL FINDINGS Osseous: Nondisplaced fracture left zygomatic arch Nondisplaced fracture left lateral orbit No orbital floor fracture.  No other facial fracture. Orbits: Bilateral cataract surgery. Large left periorbital hematoma. No hematoma within the orbit. Sinuses: Osteoma left frontal sinus. Mild mucosal edema paranasal sinuses. No air-fluid level. Soft tissues: Large left periorbital soft tissue hematoma. IMPRESSION: 1. 4 mm acute subdural hematoma around the left hemisphere without significant change in midline shift compared with 2019 CT. Small amount of subarachnoid  hemorrhage left sylvian fissure. 2. Atrophy and chronic microvascular ischemia 3. Nondisplaced fracture left zygomatic arch and left lateral orbit. Large left periorbital hematoma. 4. These results were called by telephone at the time of interpretation on 09/20/2019 at 5:24 pm to provider Kathrynn Humble , who verbally acknowledged these results. Electronically Signed   By: Franchot Gallo M.D.   On: 09/20/2019 17:24   Ct Cervical Spine Wo Contrast  Result Date: 09/20/2019 CLINICAL DATA:  Fall. Facial fracture. EXAM: CT CERVICAL SPINE WITHOUT CONTRAST TECHNIQUE: Multidetector CT imaging of the cervical spine was performed without intravenous contrast. Multiplanar CT image reconstructions were also generated. COMPARISON:  None. FINDINGS: Alignment: No static subluxation. Facets are aligned. Occipital condyles and the lateral masses of C1 and C2 are normally approximated. Skull base and vertebrae: No acute fracture. Soft tissues and spinal canal: No prevertebral fluid or swelling. No visible canal hematoma. Disc levels: Multilevel lower cervical degenerative disc disease. No bony spinal canal stenosis. Multilevel facet arthrosis, left worse than right. Upper chest: No pneumothorax, pulmonary nodule or pleural effusion. Other: Normal visualized paraspinal cervical soft tissues. IMPRESSION: No acute fracture or static subluxation of the cervical spine. Electronically Signed   By: Ulyses Jarred M.D.   On: 09/20/2019 20:10   Dg Chest Port 1 View  Result Date: 09/20/2019 CLINICAL DATA:  Pain following fall EXAM: PORTABLE CHEST 1 VIEW COMPARISON:  April 01, 2008 FINDINGS: There is slight left base atelectasis. The lungs elsewhere are clear. Heart is upper normal in size with pulmonary vascularity normal. No adenopathy. No pneumothorax. There is an impacted fracture of the proximal left humerus, age uncertain. There are surgical clips in the left breast region. IMPRESSION: Slight left base atelectasis. No edema or  consolidation. Heart upper normal in size. Age uncertain impaction fracture proximal left humeral metaphysis. This finding warrants correlation with dedicated shoulder radiographs to further evaluate. Electronically  Signed   By: Lowella Grip III M.D.   On: 09/20/2019 19:44   Dg Shoulder Left  Result Date: 09/20/2019 CLINICAL DATA:  Evaluate left shoulder fracture seen on recent chest x-ray EXAM: LEFT SHOULDER - 2+ VIEW COMPARISON:  Chest x-ray from earlier in the same day as well as prior shoulder x-ray from 01/05/2019 FINDINGS: Proximal humeral fracture is again identified involving primarily the surgical neck with impaction at the fracture site. The overall appearance demonstrates increased impaction when compare with the prior exam from 01/05/2019 when the fracture was originally diagnosed. No dislocation is seen. The underlying bony thorax is within normal limits. IMPRESSION: Increasing impaction at the site of the surgical neck fracture when compared with the prior exam from 01/05/2019 when it was originally diagnosed. Electronically Signed   By: Inez Catalina M.D.   On: 09/20/2019 21:17   Ct Maxillofacial Wo Contrast  Result Date: 09/20/2019 CLINICAL DATA:  Fall.  Facial injury EXAM: CT HEAD WITHOUT CONTRAST CT MAXILLOFACIAL WITHOUT CONTRAST TECHNIQUE: Multidetector CT imaging of the head and maxillofacial structures were performed using the standard protocol without intravenous contrast. Multiplanar CT image reconstructions of the maxillofacial structures were also generated. COMPARISON:  CT head 12/20/2017 FINDINGS: CT HEAD FINDINGS Brain: Moderate atrophy. Acute subdural hematoma on the left measuring up to 4 mm in thickness. Small area of subarachnoid hemorrhage in the left sylvian fissure. 3 mm midline shift to the right is unchanged from the prior study. Negative for acute infarct. Chronic microvascular ischemic changes in the white matter bilaterally. No mass lesion. Vascular: Negative for  hyperdense vessel Skull: Left facial fractures, described below.  No skull fracture. Other: None CT MAXILLOFACIAL FINDINGS Osseous: Nondisplaced fracture left zygomatic arch Nondisplaced fracture left lateral orbit No orbital floor fracture.  No other facial fracture. Orbits: Bilateral cataract surgery. Large left periorbital hematoma. No hematoma within the orbit. Sinuses: Osteoma left frontal sinus. Mild mucosal edema paranasal sinuses. No air-fluid level. Soft tissues: Large left periorbital soft tissue hematoma. IMPRESSION: 1. 4 mm acute subdural hematoma around the left hemisphere without significant change in midline shift compared with 2019 CT. Small amount of subarachnoid hemorrhage left sylvian fissure. 2. Atrophy and chronic microvascular ischemia 3. Nondisplaced fracture left zygomatic arch and left lateral orbit. Large left periorbital hematoma. 4. These results were called by telephone at the time of interpretation on 09/20/2019 at 5:24 pm to provider Kathrynn Humble , who verbally acknowledged these results. Electronically Signed   By: Franchot Gallo M.D.   On: 09/20/2019 17:24    Pending Labs Unresulted Labs (From admission, onward)    Start     Ordered   09/21/19 [id number]  Basic metabolic panel  Tomorrow morning,   R     09/21/19 0031   09/21/19 0500  CBC  Tomorrow morning,   R     09/21/19 0031   09/20/19 1809  SARS CORONAVIRUS 2 (TAT 6-24 HRS) Nasopharyngeal Nasopharyngeal Swab  (Asymptomatic/Tier 3)  Once,   STAT    Question Answer Comment  Is this test for diagnosis or screening Screening   Symptomatic for COVID-19 as defined by CDC No   Hospitalized for COVID-19 No   Admitted to ICU for COVID-19 No   Previously tested for COVID-19 No   Resident in a congregate (group) care setting No   Employed in healthcare setting No   Pregnant No      09/20/19 1817   09/20/19 1808  CBC with Differential  Once,   STAT  09/20/19 1817          Vitals/Pain Today's Vitals   09/21/19 0115  09/21/19 0121 09/21/19 0130 09/21/19 0145  BP: (!) 162/78 (!) 162/78 (!) 164/77 (!) 164/74  Pulse: 82 90 84 83  Resp: 15 15 16 18   Temp:      TempSrc:      SpO2: 95% 94% 91% 94%  PainSc:        Isolation Precautions No active isolations  Medications Medications  levETIRAcetam (KEPPRA) tablet 500 mg (500 mg Oral Given 09/21/19 0042)  anastrozole (ARIMIDEX) tablet 1 mg (has no administration in time range)  cloNIDine (CATAPRES) tablet 0.3 mg (has no administration in time range)  hydrALAZINE (APRESOLINE) tablet 25 mg (has no administration in time range)  losartan (COZAAR) tablet 100 mg (has no administration in time range)  metoprolol succinate (TOPROL-XL) 24 hr tablet 50 mg (has no administration in time range)  levothyroxine (SYNTHROID) tablet 100 mcg (has no administration in time range)  pantoprazole (PROTONIX) EC tablet 40 mg (has no administration in time range)  acetaminophen (TYLENOL) tablet 650 mg (has no administration in time range)    Or  acetaminophen (TYLENOL) suppository 650 mg (has no administration in time range)  ondansetron (ZOFRAN) tablet 4 mg (has no administration in time range)    Or  ondansetron (ZOFRAN) injection 4 mg (has no administration in time range)  0.9 %  sodium chloride infusion ( Intravenous New Bag/Given 09/21/19 0055)  0.9 %  sodium chloride infusion (Manually program via Guardrails IV Fluids) ( Intravenous Stopped 09/20/19 2300)    Mobility walks with device Moderate fall risk   Focused Assessments    R Recommendations: See Admitting Provider Note  Report given to:   Additional Notes:

## 2019-09-21 NOTE — Evaluation (Signed)
Occupational Therapy Evaluation Patient Details Name: Courtney Shea MRN: [id number] DOB: [birth year] Today's Date: 09/21/2019    History of Present Illness 85 y.o. female admitted on 09/20/19 for fall with resultant L scalp hematoma and CT scan revealed 37mm subdural hematoma, small amount of SAH and L lateral orbit and non-displaced zygomatic arch fx.  Pt with significant PMH of syncope, stroke, THN, breast CA (with partial mastectomy bil).     Clinical Impression   Pt PTA: pt living with spouse and they give an take for assist with each other, safe to be home. Pt currently Concussion and mild TBI handout education provided. Pt's daughter in room- pt said 3 different times that she is 34, but infact will be 8 in December and finally after 3rd time redirecting pt to truth, pt able to confirm age. Pt tolerating session well. Pt limited by headache and need for AD at all times. Pt minguardA for mobility with Rw and cues for hand placement on RW and recliner for proper transfers. Pt set-upA to minguardA for ADL tasks. Pt would benefit from continued OT skilled services in next setting. OT signing off.    Follow Up Recommendations  Home health OT;Supervision/Assistance - 24 hour    Equipment Recommendations  None recommended by OT    Recommendations for Other Services       Precautions / Restrictions Precautions Precautions: Fall Precaution Comments: h/o falls PTA once in March with L UE fx (non-surgical) and this one Restrictions Weight Bearing Restrictions: No      Mobility Bed Mobility               General bed mobility comments: seated in recliner  Transfers Overall transfer level: Needs assistance Equipment used: Rolling walker (2 wheeled) Transfers: Sit to/from Stand Sit to Stand: Min guard         General transfer comment: safety for hand placement    Balance Overall balance assessment: Needs assistance Sitting-balance support: Feet supported;No upper  extremity supported Sitting balance-Leahy Scale: Good     Standing balance support: Bilateral upper extremity supported;Single extremity supported;No upper extremity supported Standing balance-Leahy Scale: Fair Standing balance comment: Pt able to preform tooth brushing, hand washing at sink, but prefers to lean on her left elbow while doing these tasks (flexed posture).  Pt with weak postural muscles.                            ADL either performed or assessed with clinical judgement   ADL Overall ADL's : Needs assistance/impaired Eating/Feeding: Set up;Sitting   Grooming: Min guard;Standing   Upper Body Bathing: Min guard;Standing   Lower Body Bathing: Set up;Min guard;Sitting/lateral leans;Sit to/from stand   Upper Body Dressing : Set up;Sitting   Lower Body Dressing: Set up;Sitting/lateral leans;Sit to/from stand   Toilet Transfer: Optician, dispensing;Ambulation;RW Toilet Transfer Details (indicate cue type and reason): cues for hand placement Toileting- Clothing Manipulation and Hygiene: Min guard;Sitting/lateral lean;Sit to/from stand       Functional mobility during ADLs: Min guard;Cueing for safety;Cueing for sequencing;Rolling walker General ADL Comments: Pt tolerating session well. Pt limited by headache and need for AD at all times.     Vision Baseline Vision/History: No visual deficits Vision Assessment?: No apparent visual deficits     Perception     Praxis      Pertinent Vitals/Pain Pain Assessment: Faces Faces Pain Scale: Hurts little more Pain Location: left head Pain Descriptors / Indicators:  Aching Pain Intervention(s): Limited activity within patient's tolerance     Hand Dominance Right   Extremity/Trunk Assessment Upper Extremity Assessment Upper Extremity Assessment: Generalized weakness   Lower Extremity Assessment Lower Extremity Assessment: Generalized weakness   Cervical / Trunk Assessment Cervical / Trunk  Assessment: Kyphotic   Communication Communication Communication: No difficulties   Cognition Arousal/Alertness: Awake/alert Behavior During Therapy: WFL for tasks assessed/performed Overall Cognitive Status: Within Functional Limits for tasks assessed                                 General Comments: Pt reports that she remembers getting out of the car, and then the events surrounding her fall are a bit fuzzy.  Otherwise, cognition appears intact.    General Comments  Concussion and mild TBI handout education provided. Pt's daughter in room- pt said 3 different times that she is 43, but infact will be 68 in December and finally after 3rd time redirecting pt to truth, pt able to confirm age.    Exercises     Shoulder Instructions      Home Living Family/patient expects to be discharged to:: Private residence Living Arrangements: Spouse/significant other Available Help at Discharge: Family;Available 24 hours/day Type of Home: House Home Access: Stairs to enter [employer] of Steps: 1 Entrance Stairs-Rails: None Home Layout: One level     Bathroom Shower/Tub: Teacher, early years/pre: Standard     Home Equipment: Environmental consultant - 2 wheels;Cane - single point   Additional Comments: active with OP PT at [employer] (balance and gait training).       Prior Functioning/Environment Level of Independence: Needs assistance  Gait / Transfers Assistance Needed: Pt walks without AD in home, uses cane for community access ADL's / Homemaking Assistance Needed: Pt reports independence in ADLs, shared responsitbility of cooking and cleaning, prefers a tub bath, but can no longer get down into the tub.             OT Problem List: Decreased activity tolerance      OT Treatment/Interventions:      OT Goals(Current goals can be found in the care plan section) Acute Rehab OT Goals Patient Stated Goal: to go home OT Goal Formulation: With patient Time For  Goal Achievement: 10/05/19 Potential to Achieve Goals: Good  OT Frequency:     Barriers to D/C:            Co-evaluation              AM-PAC OT "6 Clicks" Daily Activity     Outcome Measure Help from another person eating meals?: None Help from another person taking care of personal grooming?: A Little Help from another person toileting, which includes using toliet, bedpan, or urinal?: None Help from another person bathing (including washing, rinsing, drying)?: None Help from another person to put on and taking off regular upper body clothing?: None Help from another person to put on and taking off regular lower body clothing?: A Little 6 Click Score: 22   End of Session Equipment Utilized During Treatment: Gait belt;Rolling walker Nurse Communication: Mobility status  Activity Tolerance: Patient tolerated treatment well Patient left: in chair;with call bell/phone within reach;with family/visitor present  OT Visit Diagnosis: Unsteadiness on feet (R26.81);Muscle weakness (generalized) (M62.81)                Time: [id number] OT Time Calculation (min): 25 min Charges:  OT General Charges $OT Visit: 1 Visit OT Evaluation $OT Eval Moderate Complexity: 1 Mod OT Treatments $Self Care/Home Management : 8-22 mins  Ebony Hail Harold Hedge) Marsa Aris OTR/L Acute Rehabilitation Services Pager: [phone] Office: [place] 09/21/2019, 5:10 PM

## 2019-09-21 NOTE — H&P (Signed)
History and Physical    Courtney Shea [id number] DOB: 1930-05-26 DOA: 09/20/2019  PCP: Josetta Huddle, MD  Patient coming from: Home.  Chief Complaint: Fall.  HPI: Courtney Shea is a 85 y.o. female with history of hypertension, hypothyroidism, TIA on Plavix and aspirin.  History of syncope was brought to the ER after patient had a fall.  Patient states over the last 2 weeks patient has been following up at rehab for physical therapy for weakness.  Weakness cause is not clear.  Last evening when she was going to the rehab husband was helping her when she suddenly felt lost consciousness and hit her head.  She remembers being brought to the ER.  Denies any chest pain shortness of breath palpitation.  Has not had any recent change in medications.  ED Course: In the ER patient was moving all extremities.  On exam patient has left scalp hematoma with left periorbital hematoma.  CT of the head and CT maxillofacial were done and shows left-sided 4 mm subdural hematoma small amount of Subarachnoid hemorrhage and also left lateral orbit and nondisplaced left zygomatic arch fracture.  Neurosurgeon on call and ENT was consulted.  At this time neurosurgery recommended platelet transfusion since patient was on antiplatelet agents and repeat CT head in the morning.  ENT Dr. Erik Obey Follow-up with ENT in 1 week.  EKG shows normal sinus rhythm with prolonged QTC.  Labs show creatinine 0.7 potassium 3.8 CBC was pending.  Review of Systems: As per HPI, rest all negative.   Past Medical History:  Diagnosis Date   Breast cancer [facility])    Breast cancer of lower-outer quadrant of right female breast ([place]) 05/07/2015   GERD (gastroesophageal reflux disease)    Hypertension    Hypothyroid    Personal history of radiation therapy    Stroke ([place])    TIA, no deficits, placed on Plavix   Syncope    Ventricular bigeminy     Past Surgical History:  Procedure Laterality Date   ABDOMINAL  HYSTERECTOMY     BLADDER SUSPENSION     BREAST LUMPECTOMY Right    2016   CATARACT EXTRACTION     INCONTINENCE SURGERY     Ovarian cyst removed     PARTIAL MASTECTOMY WITH NEEDLE LOCALIZATION Bilateral 05/29/2015   Procedure: LEFT BREAST PARTIAL MASTECTOMY WITH NEEDLE LOCALIZATION TIMES 2 AND RIGHT BREAST PARTIAL MASTECTOMY WITH NEEDLE LOCALIZATION;  Surgeon: Erroll Luna, MD;  Location: [place];  Service: General;  Laterality: Bilateral;   RE-EXCISION OF BREAST LUMPECTOMY Left 06/21/2015   Procedure: LEFT BREAST RE EXCISION OF LUMPECTOMY;  Surgeon: Erroll Luna, MD;  Location: [place];  Service: General;  Laterality: Left;     reports that she has never smoked. She has never used smokeless tobacco. She reports that she does not drink alcohol or use drugs.  Allergies  Allergen Reactions   Codeine Nausea And Vomiting   Macrodantin [Nitrofurantoin Macrocrystal] Nausea Only    "extreme nausea"   Crestor [Rosuvastatin Calcium] Other (See Comments)    "aching in legs"   Doxycycline Itching and Rash   Hydrochlorothiazide Rash    "all over body rash"   Norvasc [Amlodipine Besylate] Swelling    "swelling in ankles"   Sulfa Antibiotics Nausea And Vomiting    Family History  Problem Relation Age of Onset   Coronary artery disease Mother        MI at age 75   Breast cancer Sister 76  Kidney disease Brother    Cancer Sister        colon cancer suggested    Prior to Admission medications   Medication Sig Start Date End Date Taking? Authorizing Provider  anastrozole (ARIMIDEX) 1 MG tablet TAKE 1 TABLET BY MOUTH EVERY DAY Patient taking differently: Take 1 mg by mouth daily.  02/28/19  Yes Magrinat, Virgie Dad, MD  aspirin EC 81 MG tablet Take 81 mg by mouth once.    Yes [provider]  Calcium Carbonate-Vitamin D (CALCIUM 600 + D PO) Take 1 tablet by mouth every evening.    Yes [provider]  clopidogrel (PLAVIX)  75 MG tablet Take 75 mg by mouth daily.   Yes [provider]  Cranberry 500 MG CAPS Take 500 mg by mouth daily.   Yes [provider]  furosemide (LASIX) 20 MG tablet Take 20 mg by mouth daily.    Yes [provider]  hydrALAZINE (APRESOLINE) 25 MG tablet Take 25 mg by mouth 2 (two) times daily with a meal. 05/29/18  Yes [provider]  levothyroxine (SYNTHROID) 100 MCG tablet Take 100 mcg by mouth daily.    Yes [provider]  losartan (COZAAR) 100 MG tablet Take 100 mg by mouth daily.   Yes [provider]  metoprolol succinate (TOPROL-XL) 50 MG 24 hr tablet Take 50 mg by mouth daily. Take with or immediately following a meal.   Yes [provider]  pantoprazole (PROTONIX) 40 MG tablet Take 40 mg by mouth every evening.   Yes [provider]  cloNIDine (CATAPRES) 0.3 MG tablet Take 0.3 mg by mouth at bedtime. 08/25/19   [provider]  HYDROcodone-acetaminophen (NORCO/VICODIN) 5-325 MG tablet Take 1 tablet by mouth every 4 (four) hours as needed. Patient not taking: Reported on 08/17/2019 01/05/19   Recardo Evangelist, PA-C    Physical Exam: Constitutional: Moderately built and nourished. Vitals:   09/20/19 2230 09/20/19 2300 09/20/19 2330 09/21/19 0000  BP: (!) 172/79 (!) 183/85 (!) 191/88 (!) 179/77  Pulse: 91 90 100   Resp: 18 (!) 21 18 13   Temp:      TempSrc:      SpO2: 91% 95% 95%    Eyes: Anicteric no pallor left periorbital hematoma. ENMT: Left periorbital hematoma. Neck: No neck rigidity. Respiratory: No rhonchi or crepitations. Cardiovascular: S1-S2 heard. Abdomen: Soft nontender bowel sounds present. Musculoskeletal: No edema. Skin: Left periorbital hematoma with ecchymosis. Neurologic: Alert awake oriented to time place and person.  Moves all extremities. Psychiatric: Appears normal per normal affect.   Labs on Admission: I have personally reviewed following labs and imaging  studies  CBC: No results for input(s): WBC, NEUTROABS, HGB, HCT, MCV, PLT in the last 168 hours. Basic Metabolic Panel: Recent Labs  Lab 09/20/19 1847  NA 136  K 3.8  CL 100  CO2 24  GLUCOSE 114*  BUN 20  CREATININE 0.73  CALCIUM 9.6   GFR: CrCl cannot be calculated (Unknown ideal weight.). Liver Function Tests: No results for input(s): AST, ALT, ALKPHOS, BILITOT, PROT, ALBUMIN in the last 168 hours. No results for input(s): LIPASE, AMYLASE in the last 168 hours. No results for input(s): AMMONIA in the last 168 hours. Coagulation Profile: No results for input(s): INR, PROTIME in the last 168 hours. Cardiac Enzymes: No results for input(s): CKTOTAL, CKMB, CKMBINDEX, TROPONINI in the last 168 hours. BNP (last 3 results) No results for input(s): PROBNP in the last 8760 hours. HbA1C: No results  for input(s): HGBA1C in the last 72 hours. CBG: No results for input(s): GLUCAP in the last 168 hours. Lipid Profile: No results for input(s): CHOL, HDL, LDLCALC, TRIG, CHOLHDL, LDLDIRECT in the last 72 hours. Thyroid Function Tests: No results for input(s): TSH, T4TOTAL, FREET4, T3FREE, THYROIDAB in the last 72 hours. Anemia Panel: No results for input(s): VITAMINB12, FOLATE, FERRITIN, TIBC, IRON, RETICCTPCT in the last 72 hours. Urine analysis:    Component Value Date/Time   COLORURINE YELLOW 06/20/2013 0910   APPEARANCEUR CLEAR 06/20/2013 0910   LABSPEC 1.004 (L) 06/20/2013 0910   PHURINE 6.5 06/20/2013 0910   GLUCOSEU NEGATIVE 06/20/2013 0910   HGBUR NEGATIVE 06/20/2013 0910   BILIRUBINUR NEGATIVE 06/20/2013 0910   KETONESUR NEGATIVE 06/20/2013 0910   PROTEINUR NEGATIVE 06/20/2013 0910   UROBILINOGEN 0.2 06/20/2013 0910   NITRITE NEGATIVE 06/20/2013 0910   LEUKOCYTESUR TRACE (A) 06/20/2013 0910   Sepsis Labs: @LABRCNTIP (procalcitonin:4,lacticidven:4) )No results found for this or any previous visit (from the past 240 hour(s)).   Radiological Exams on Admission: Ct  Head Wo Contrast  Result Date: 09/20/2019 CLINICAL DATA:  Fall.  Facial injury EXAM: CT HEAD WITHOUT CONTRAST CT MAXILLOFACIAL WITHOUT CONTRAST TECHNIQUE: Multidetector CT imaging of the head and maxillofacial structures were performed using the standard protocol without intravenous contrast. Multiplanar CT image reconstructions of the maxillofacial structures were also generated. COMPARISON:  CT head 12/20/2017 FINDINGS: CT HEAD FINDINGS Brain: Moderate atrophy. Acute subdural hematoma on the left measuring up to 4 mm in thickness. Small area of subarachnoid hemorrhage in the left sylvian fissure. 3 mm midline shift to the right is unchanged from the prior study. Negative for acute infarct. Chronic microvascular ischemic changes in the white matter bilaterally. No mass lesion. Vascular: Negative for hyperdense vessel Skull: Left facial fractures, described below.  No skull fracture. Other: None CT MAXILLOFACIAL FINDINGS Osseous: Nondisplaced fracture left zygomatic arch Nondisplaced fracture left lateral orbit No orbital floor fracture.  No other facial fracture. Orbits: Bilateral cataract surgery. Large left periorbital hematoma. No hematoma within the orbit. Sinuses: Osteoma left frontal sinus. Mild mucosal edema paranasal sinuses. No air-fluid level. Soft tissues: Large left periorbital soft tissue hematoma. IMPRESSION: 1. 4 mm acute subdural hematoma around the left hemisphere without significant change in midline shift compared with 2019 CT. Small amount of subarachnoid hemorrhage left sylvian fissure. 2. Atrophy and chronic microvascular ischemia 3. Nondisplaced fracture left zygomatic arch and left lateral orbit. Large left periorbital hematoma. 4. These results were called by telephone at the time of interpretation on 09/20/2019 at 5:24 pm to provider Kathrynn Humble , who verbally acknowledged these results. Electronically Signed   By: Franchot Gallo M.D.   On: 09/20/2019 17:24   Ct Cervical Spine Wo  Contrast  Result Date: 09/20/2019 CLINICAL DATA:  Fall. Facial fracture. EXAM: CT CERVICAL SPINE WITHOUT CONTRAST TECHNIQUE: Multidetector CT imaging of the cervical spine was performed without intravenous contrast. Multiplanar CT image reconstructions were also generated. COMPARISON:  None. FINDINGS: Alignment: No static subluxation. Facets are aligned. Occipital condyles and the lateral masses of C1 and C2 are normally approximated. Skull base and vertebrae: No acute fracture. Soft tissues and spinal canal: No prevertebral fluid or swelling. No visible canal hematoma. Disc levels: Multilevel lower cervical degenerative disc disease. No bony spinal canal stenosis. Multilevel facet arthrosis, left worse than right. Upper chest: No pneumothorax, pulmonary nodule or pleural effusion. Other: Normal visualized paraspinal cervical soft tissues. IMPRESSION: No acute fracture or static subluxation of the cervical spine. Electronically Signed   By:  Ulyses Jarred M.D.   On: 09/20/2019 20:10   Dg Chest Port 1 View  Result Date: 09/20/2019 CLINICAL DATA:  Pain following fall EXAM: PORTABLE CHEST 1 VIEW COMPARISON:  April 01, 2008 FINDINGS: There is slight left base atelectasis. The lungs elsewhere are clear. Heart is upper normal in size with pulmonary vascularity normal. No adenopathy. No pneumothorax. There is an impacted fracture of the proximal left humerus, age uncertain. There are surgical clips in the left breast region. IMPRESSION: Slight left base atelectasis. No edema or consolidation. Heart upper normal in size. Age uncertain impaction fracture proximal left humeral metaphysis. This finding warrants correlation with dedicated shoulder radiographs to further evaluate. Electronically Signed   By: Lowella Grip III M.D.   On: 09/20/2019 19:44   Dg Shoulder Left  Result Date: 09/20/2019 CLINICAL DATA:  Evaluate left shoulder fracture seen on recent chest x-ray EXAM: LEFT SHOULDER - 2+ VIEW COMPARISON:   Chest x-ray from earlier in the same day as well as prior shoulder x-ray from 01/05/2019 FINDINGS: Proximal humeral fracture is again identified involving primarily the surgical neck with impaction at the fracture site. The overall appearance demonstrates increased impaction when compare with the prior exam from 01/05/2019 when the fracture was originally diagnosed. No dislocation is seen. The underlying bony thorax is within normal limits. IMPRESSION: Increasing impaction at the site of the surgical neck fracture when compared with the prior exam from 01/05/2019 when it was originally diagnosed. Electronically Signed   By: Inez Catalina M.D.   On: 09/20/2019 21:17   Ct Maxillofacial Wo Contrast  Result Date: 09/20/2019 CLINICAL DATA:  Fall.  Facial injury EXAM: CT HEAD WITHOUT CONTRAST CT MAXILLOFACIAL WITHOUT CONTRAST TECHNIQUE: Multidetector CT imaging of the head and maxillofacial structures were performed using the standard protocol without intravenous contrast. Multiplanar CT image reconstructions of the maxillofacial structures were also generated. COMPARISON:  CT head 12/20/2017 FINDINGS: CT HEAD FINDINGS Brain: Moderate atrophy. Acute subdural hematoma on the left measuring up to 4 mm in thickness. Small area of subarachnoid hemorrhage in the left sylvian fissure. 3 mm midline shift to the right is unchanged from the prior study. Negative for acute infarct. Chronic microvascular ischemic changes in the white matter bilaterally. No mass lesion. Vascular: Negative for hyperdense vessel Skull: Left facial fractures, described below.  No skull fracture. Other: None CT MAXILLOFACIAL FINDINGS Osseous: Nondisplaced fracture left zygomatic arch Nondisplaced fracture left lateral orbit No orbital floor fracture.  No other facial fracture. Orbits: Bilateral cataract surgery. Large left periorbital hematoma. No hematoma within the orbit. Sinuses: Osteoma left frontal sinus. Mild mucosal edema paranasal sinuses. No  air-fluid level. Soft tissues: Large left periorbital soft tissue hematoma. IMPRESSION: 1. 4 mm acute subdural hematoma around the left hemisphere without significant change in midline shift compared with 2019 CT. Small amount of subarachnoid hemorrhage left sylvian fissure. 2. Atrophy and chronic microvascular ischemia 3. Nondisplaced fracture left zygomatic arch and left lateral orbit. Large left periorbital hematoma. 4. These results were called by telephone at the time of interpretation on 09/20/2019 at 5:24 pm to provider Kathrynn Humble , who verbally acknowledged these results. Electronically Signed   By: Franchot Gallo M.D.   On: 09/20/2019 17:24    EKG: Independently reviewed.  Normal sinus rhythm with prolonged QTC.  Assessment/Plan Principal Problem:   Subdural hematoma (HCC) Active Problems:   Hypothyroidism   HTN (hypertension)   Carcinoma of upper-inner quadrant of left female breast ([place])    1. Subdural hematoma with subarachnoid hemorrhage  and left zygomatic arch fracture without displacement and left lateral orbital fracture after fall -appreciate neurosurgery consult.  Patient is receiving platelet transfusion and repeat CT head has been ordered.  Patient will need to follow-up with ENT Dr. Erik Obey in 1 week's time with antibiotics until then for the zygomatic arch fracture. 2. Left shoulder shows increased impaction in the previous known fracture.  May need orthopedic input probably as outpatient. 3. Possible syncope with prolonged QTC -has had previous history of syncope.  Avoid QTC prolonging medications.  May need cardiology input or referral as outpatient for the syncope work-up.  Since has had previous episodes of syncope was followed by cardiologist. 4. History of TIA holding antiplatelet agents due to bleeding. 5. Hypertension uncontrolled -  we will continue home medication including clonidine hydralazine losartan and metoprolol.  As needed IV hydralazine. 6. Hypothyroidism on  Synthroid. 7. Breast cancer on anastrozole.   CBC is pending.  DVT prophylaxis: SCDs due to bleeding. Code Status: DNR. Family Communication: Discussed with patient. Disposition Plan: To be determined. Consults called: Neurosurgery ENT and physical therapy. Admission status: Observation.   Rise Patience MD [facility] Pager [phone].  If 7PM-7AM, please contact night-coverage [email or web address] Password [facility]  09/21/2019, 12:32 AM

## 2019-09-21 NOTE — Progress Notes (Addendum)
Patient brought into observation status after midnight, here after a fall/syncopal event while getting out of her car to go to PT.  Will watch on tele for 24 hours since orthostatics are not positive. Subdural hematoma with subarachnoid hemorrhage and left zygomatic arch fracture without displacement and left lateral orbital fracture after fall  -appreciate neurosurgery consult- repeat CT head stable.  Plavix on hold, will need 1 month follow up -Patient will need to follow-up with ENT Dr. Erik Obey in 1 week's time with antibiotics until then for the zygomatic arch fracture -PT eval appreciated -Possible syncope with prolonged QTC -has had previous history of syncope.  Avoid QTC prolonging medications, saw cardiology in 2013: Myoview in February of 2013 showed normal perfusion. The study was not gated. CardioNet showed sinus rhythm with PACs, PVCs, isolated couplet and rare junctional escape beat Holding parameters on BP as BP lower end of normal Eulogio Bear DO

## 2019-09-22 DIAGNOSIS — E039 Hypothyroidism, unspecified: Secondary | ICD-10-CM

## 2019-09-22 DIAGNOSIS — S0285XA Fracture of orbit, unspecified, initial encounter for closed fracture: Secondary | ICD-10-CM

## 2019-09-22 MED ORDER — FUROSEMIDE 20 MG PO TABS: 20.0000 mg | ORAL_TABLET | Freq: Every day | ORAL | Status: DC | PRN

## 2019-09-22 MED ORDER — LEVETIRACETAM 500 MG PO TABS: 500.0000 mg | ORAL_TABLET | Freq: Two times a day (BID) | ORAL | 0 refills | Status: DC

## 2019-09-22 MED ORDER — AMOXICILLIN-POT CLAVULANATE 500-125 MG PO TABS: 1.0000 | ORAL_TABLET | Freq: Two times a day (BID) | ORAL | 0 refills | Status: DC

## 2019-09-22 MED ORDER — ACETAMINOPHEN 325 MG PO TABS: 650.0000 mg | ORAL_TABLET | Freq: Four times a day (QID) | ORAL | Status: AC | PRN

## 2019-09-22 NOTE — Progress Notes (Signed)
D/C instructions provided to patient, denies questions/concerns at this time. Patient transported to front entrance via WC, tol well. 

## 2019-09-22 NOTE — Discharge Summary (Signed)
Physician Discharge Summary  Courtney Shea [id number] DOB: 06/19/1930 DOA: 09/20/2019  PCP: Josetta Huddle, MD  Admit date: 09/20/2019 Discharge date: 09/22/2019  Admitted From: home Discharge disposition: home   Recommendations for Outpatient Follow-Up:   1. Home health 2. Referral to Dr. Warren Danes for follow up 3. abx given along with keppra as prophylaxis  4. 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  Diet recommendation: Low sodium, heart healthy.  Carbohydrate-modified.  Regular.  Wound care: None.  Code status: Full.   History of Present Illness:   Courtney Shea is a 85 y.o. female with history of hypertension, hypothyroidism, TIA on Plavix and aspirin.  History of syncope was brought to the ER after patient had a fall.  Patient states over the last 2 weeks patient has been following up at rehab for physical therapy for weakness.  Weakness cause is not clear.  Last evening when she was going to the rehab husband was helping her when she suddenly felt lost consciousness and hit her head.  She remembers being brought to the ER.  Denies any chest pain shortness of breath palpitation.  Has not had any recent change in medications.   Hospital Course by Problem:   Subdural hematoma with subarachnoid hemorrhage and left zygomatic arch fracture without displacement and left lateral orbital fracture after fall Patient brought into observation status after midnight, here after a fall/syncopal event while getting out of her car to go to PT.   -tele unrevealing -appreciate neurosurgery consult- repeat CT head stable. Plavix/ASA on hold, will need 1 month follow up -Patient will need to follow-up  with ENT Dr. Erik Obey in 1 week's time with antibiotics until then for the zygomatic arch fracture -PT eval appreciated- home health arranged -Possible syncope with prolonged QTC-has had previous history of syncope. Avoid QTC prolonging medications, saw cardiology in 2013: Myoview in February of 2013 showed normal perfusion. The study was not gated. CardioNet showed sinus rhythm with PACs, PVCs, isolated couplet and rare junctional escape beat -adjusted BP medications as BP lower end of normal     Medical Consultants:   NS   Discharge Exam:   Vitals:   09/22/19 0908 09/22/19 0910  BP: (!) 148/85 (!) 150/76  Pulse: 78 88  Resp: 20 17  Temp: 98.5 F (36.9 C)   SpO2: 98% 96%   Vitals:   09/21/19 2123 09/22/19 0534 09/22/19 0908 09/22/19 0910  BP: (!) 144/79 (!) 120/58 (!) 148/85 (!) 150/76  Pulse:  75 78 88  Resp:  16 20 17   Temp:  98.2 F (36.8 C) 98.5 F (36.9 C)   TempSrc:  Oral Oral   SpO2:  96% 98% 96%  Weight:      Height:        General exam: Appears calm and comfortable.     The results of significant diagnostics from this hospitalization (including imaging, microbiology, ancillary and laboratory) are listed below for reference.     Procedures and Diagnostic Studies:   Ct Head Wo Contrast  Result Date: 09/21/2019 CLINICAL DATA:  Subdural hemorrhage EXAM: CT HEAD WITHOUT CONTRAST TECHNIQUE: Contiguous axial images were obtained from the base of the skull through the vertex without intravenous contrast.  COMPARISON:  CT head 09/20/2019 FINDINGS: Brain: Trace left subdural hematoma along the left frontotemporal convexity again measures up to 4 mm in maximal thickness (coronal 5/38). Small amount of subarachnoid hemorrhage is seen within the left sylvian fissure. Stable mild 3 mm rightward midline shift. No new sites of hemorrhage or extra-axial collection. No CT evidence of large territory infarct. Chronic microvascular ischemic changes and parenchymal volume loss is  similar to prior including slight asymmetry of the lateral ventricles. No mass lesion. Vascular: Atherosclerotic calcification of the carotid siphons. No hyperdense vessel. Skull: No calvarial fracture or suspicious osseous lesion. No scalp swelling or hematoma. Nondisplaced fractures of the left lateral orbital wall and zygomatic arch. Extensive left frontal, periorbital and malar soft tissue swelling and hematoma. 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 Left periorbital hematoma. No intraorbital hematoma. Other: None IMPRESSION: Stable 4 mm subdural hematoma along the left frontotemporal convexity without significant change from comparison CT. No new sites of hemorrhage the or other significant interval change. Extensive left frontal scalp and facial swelling. Redemonstrated fractures of the left lateral orbit and left zygomatic arch. Electronically Signed   By: Lovena Le M.D.   On: 09/21/2019 04:08   Ct Head Wo Contrast  Result Date: 09/20/2019 CLINICAL DATA:  Fall.  Facial injury EXAM: CT HEAD WITHOUT CONTRAST CT MAXILLOFACIAL WITHOUT CONTRAST TECHNIQUE: Multidetector CT imaging of the head and maxillofacial structures were performed using the standard protocol without intravenous contrast. Multiplanar CT image reconstructions of the maxillofacial structures were also generated. COMPARISON:  CT head 12/20/2017 FINDINGS: CT HEAD FINDINGS Brain: Moderate atrophy. Acute subdural hematoma on the left measuring up to 4 mm in thickness. Small area of subarachnoid hemorrhage in the left sylvian fissure. 3 mm midline shift to the right is unchanged from the prior study. Negative for acute infarct. Chronic microvascular ischemic changes in the white matter bilaterally. No mass lesion. Vascular: Negative for hyperdense vessel Skull: Left facial fractures, described below.  No skull fracture. Other: None CT MAXILLOFACIAL FINDINGS Osseous: Nondisplaced fracture left zygomatic arch Nondisplaced fracture  left lateral orbit No orbital floor fracture.  No other facial fracture. Orbits: Bilateral cataract surgery. Large left periorbital hematoma. No hematoma within the orbit. Sinuses: Osteoma left frontal sinus. Mild mucosal edema paranasal sinuses. No air-fluid level. Soft tissues: Large left periorbital soft tissue hematoma. IMPRESSION: 1. 4 mm acute subdural hematoma around the left hemisphere without significant change in midline shift compared with 2019 CT. Small amount of subarachnoid hemorrhage left sylvian fissure. 2. Atrophy and chronic microvascular ischemia 3. Nondisplaced fracture left zygomatic arch and left lateral orbit. Large left periorbital hematoma. 4. These results were called by telephone at the time of interpretation on 09/20/2019 at 5:24 pm to provider Kathrynn Humble , who verbally acknowledged these results. Electronically Signed   By: Franchot Gallo M.D.   On: 09/20/2019 17:24   Ct Cervical Spine Wo Contrast  Result Date: 09/20/2019 CLINICAL DATA:  Fall. Facial fracture. EXAM: CT CERVICAL SPINE WITHOUT CONTRAST TECHNIQUE: Multidetector CT imaging of the cervical spine was performed without intravenous contrast. Multiplanar CT image reconstructions were also generated. COMPARISON:  None. FINDINGS: Alignment: No static subluxation. Facets are aligned. Occipital condyles and the lateral masses of C1 and C2 are normally approximated. Skull base and vertebrae: No acute fracture. Soft tissues and spinal canal: No prevertebral fluid or swelling. No visible canal hematoma. Disc levels: Multilevel lower cervical degenerative disc disease. No bony spinal canal stenosis. Multilevel facet arthrosis, left worse than right. Upper chest: No pneumothorax,  pulmonary nodule or pleural effusion. Other: Normal visualized paraspinal cervical soft tissues. IMPRESSION: No acute fracture or static subluxation of the cervical spine. Electronically Signed   By: Ulyses Jarred M.D.   On: 09/20/2019 20:10   Dg Chest Port  1 View  Result Date: 09/20/2019 CLINICAL DATA:  Pain following fall EXAM: PORTABLE CHEST 1 VIEW COMPARISON:  April 01, 2008 FINDINGS: There is slight left base atelectasis. The lungs elsewhere are clear. Heart is upper normal in size with pulmonary vascularity normal. No adenopathy. No pneumothorax. There is an impacted fracture of the proximal left humerus, age uncertain. There are surgical clips in the left breast region. IMPRESSION: Slight left base atelectasis. No edema or consolidation. Heart upper normal in size. Age uncertain impaction fracture proximal left humeral metaphysis. This finding warrants correlation with dedicated shoulder radiographs to further evaluate. Electronically Signed   By: Lowella Grip III M.D.   On: 09/20/2019 19:44   Dg Shoulder Left  Result Date: 09/20/2019 CLINICAL DATA:  Evaluate left shoulder fracture seen on recent chest x-ray EXAM: LEFT SHOULDER - 2+ VIEW COMPARISON:  Chest x-ray from earlier in the same day as well as prior shoulder x-ray from 01/05/2019 FINDINGS: Proximal humeral fracture is again identified involving primarily the surgical neck with impaction at the fracture site. The overall appearance demonstrates increased impaction when compare with the prior exam from 01/05/2019 when the fracture was originally diagnosed. No dislocation is seen. The underlying bony thorax is within normal limits. IMPRESSION: Increasing impaction at the site of the surgical neck fracture when compared with the prior exam from 01/05/2019 when it was originally diagnosed. Electronically Signed   By: Inez Catalina M.D.   On: 09/20/2019 21:17   Ct Maxillofacial Wo Contrast  Result Date: 09/20/2019 CLINICAL DATA:  Fall.  Facial injury EXAM: CT HEAD WITHOUT CONTRAST CT MAXILLOFACIAL WITHOUT CONTRAST TECHNIQUE: Multidetector CT imaging of the head and maxillofacial structures were performed using the standard protocol without intravenous contrast. Multiplanar CT image  reconstructions of the maxillofacial structures were also generated. COMPARISON:  CT head 12/20/2017 FINDINGS: CT HEAD FINDINGS Brain: Moderate atrophy. Acute subdural hematoma on the left measuring up to 4 mm in thickness. Small area of subarachnoid hemorrhage in the left sylvian fissure. 3 mm midline shift to the right is unchanged from the prior study. Negative for acute infarct. Chronic microvascular ischemic changes in the white matter bilaterally. No mass lesion. Vascular: Negative for hyperdense vessel Skull: Left facial fractures, described below.  No skull fracture. Other: None CT MAXILLOFACIAL FINDINGS Osseous: Nondisplaced fracture left zygomatic arch Nondisplaced fracture left lateral orbit No orbital floor fracture.  No other facial fracture. Orbits: Bilateral cataract surgery. Large left periorbital hematoma. No hematoma within the orbit. Sinuses: Osteoma left frontal sinus. Mild mucosal edema paranasal sinuses. No air-fluid level. Soft tissues: Large left periorbital soft tissue hematoma. IMPRESSION: 1. 4 mm acute subdural hematoma around the left hemisphere without significant change in midline shift compared with 2019 CT. Small amount of subarachnoid hemorrhage left sylvian fissure. 2. Atrophy and chronic microvascular ischemia 3. Nondisplaced fracture left zygomatic arch and left lateral orbit. Large left periorbital hematoma. 4. These results were called by telephone at the time of interpretation on 09/20/2019 at 5:24 pm to provider Kathrynn Humble , who verbally acknowledged these results. Electronically Signed   By: Franchot Gallo M.D.   On: 09/20/2019 17:24     Labs:   Basic Metabolic Panel: Recent Labs  Lab 09/20/19 1847  NA 136  K 3.8  CL 100  CO2 24  GLUCOSE 114*  BUN 20  CREATININE 0.73  CALCIUM 9.6   GFR Estimated Creatinine Clearance: 49.3 mL/min (by C-G formula based on SCr of 0.73 mg/dL). Liver Function Tests: No results for input(s): AST, ALT, ALKPHOS, BILITOT, PROT,  ALBUMIN in the last 168 hours. No results for input(s): LIPASE, AMYLASE in the last 168 hours. No results for input(s): AMMONIA in the last 168 hours. Coagulation profile Recent Labs  Lab 09/21/19 0052  INR 1.1    CBC: Recent Labs  Lab 09/21/19 0052  WBC 15.2*  NEUTROABS 12.2*  HGB 10.8*  HCT 32.8*  MCV 91.1  PLT 269   Cardiac Enzymes: No results for input(s): CKTOTAL, CKMB, CKMBINDEX, TROPONINI in the last 168 hours. BNP: Invalid input(s): POCBNP CBG: No results for input(s): GLUCAP in the last 168 hours. D-Dimer No results for input(s): DDIMER in the last 72 hours. Hgb A1c No results for input(s): HGBA1C in the last 72 hours. Lipid Profile No results for input(s): CHOL, HDL, LDLCALC, TRIG, CHOLHDL, LDLDIRECT in the last 72 hours. Thyroid function studies No results for input(s): TSH, T4TOTAL, T3FREE, THYROIDAB in the last 72 hours.  Invalid input(s): FREET3 Anemia work up No results for input(s): VITAMINB12, FOLATE, FERRITIN, TIBC, IRON, RETICCTPCT in the last 72 hours. Microbiology Recent Results (from the past 240 hour(s))  SARS CORONAVIRUS 2 (TAT 6-24 HRS) Nasopharyngeal Nasopharyngeal Swab     Status: None   Collection Time: 09/20/19  6:47 PM   Specimen: Nasopharyngeal Swab  Result Value Ref Range Status   SARS Coronavirus 2 NEGATIVE NEGATIVE Final    Comment: (NOTE) SARS-CoV-2 target nucleic acids are NOT DETECTED. The SARS-CoV-2 RNA is generally detectable in upper and lower respiratory specimens during the acute phase of infection. Negative results do not preclude SARS-CoV-2 infection, do not rule out co-infections with other pathogens, and should not be used as the sole basis for treatment or other patient management decisions. Negative results must be combined with clinical observations, patient history, and epidemiological information. The expected result is Negative. Fact Sheet for Patients: SugarRoll.be Fact Sheet  for Healthcare Providers: https://www.woods-mathews.com/ This test is not yet approved or cleared by the [place] FDA and  has been authorized for detection and/or diagnosis of SARS-CoV-2 by FDA under an Emergency Use Authorization (EUA). This EUA will remain  in effect (meaning this test can be used) for the duration of the COVID-19 declaration under Section 56 4(b)(1) of the Act, 21 U.S.C. section 360bbb-3(b)(1), unless the authorization is terminated or revoked sooner. Performed at [facility], [place] [address], [place], [place] [address]      Discharge Instructions:   Discharge Instructions    Diet - low sodium heart healthy   Complete by: As directed    Discharge instructions   Complete by: As directed    Monitor blood pressure at home- bring log to PCP-- your BP here has been on the lower side which can cause passing out spells/syncope ENT [facility]) follow up 1 week-- prophylaxis abx until then Dr. Vertell Limber would like you to have repeat head CT scan in 1 month-- hold ASA/plavix until then Outpatient PT   Increase activity slowly   Complete by: As directed      Allergies as of 09/22/2019      Reactions   Codeine Nausea And Vomiting   Macrodantin [nitrofurantoin Macrocrystal] Nausea Only   "extreme nausea"   Crestor [rosuvastatin Calcium] Other (See Comments)   "aching in legs"  Doxycycline Itching, Rash   Hydrochlorothiazide Rash   "all over body rash"   Norvasc [amlodipine Besylate] Swelling   "swelling in ankles"   Sulfa Antibiotics Nausea And Vomiting      Medication List    STOP taking these medications   aspirin EC 81 MG tablet   clopidogrel 75 MG tablet Commonly known as: PLAVIX   hydrALAZINE 25 MG tablet Commonly known as: APRESOLINE   HYDROcodone-acetaminophen 5-325 MG tablet Commonly known as: NORCO/VICODIN     TAKE these medications   acetaminophen 325 MG tablet Commonly known as: TYLENOL Take 2 tablets (650 mg total) by  mouth every 6 (six) hours as needed for mild pain (or Fever >/= 101).   amoxicillin-clavulanate 500-125 MG tablet Commonly known as: Augmentin Take 1 tablet (500 mg total) by mouth 2 (two) times daily.   anastrozole 1 MG tablet Commonly known as: ARIMIDEX Take 1 tablet (1 mg total) by mouth daily.   CALCIUM 600 + D PO Take 1 tablet by mouth every evening.   cloNIDine 0.3 MG tablet Commonly known as: CATAPRES Take 0.3 mg by mouth at bedtime.   Cranberry 500 MG Caps Take 500 mg by mouth daily.   furosemide 20 MG tablet Commonly known as: LASIX Take 1 tablet (20 mg total) by mouth daily as needed for edema. What changed:   when to take this  reasons to take this   levETIRAcetam 500 MG tablet Commonly known as: KEPPRA Take 1 tablet (500 mg total) by mouth 2 (two) times daily.   levothyroxine 100 MCG tablet Commonly known as: SYNTHROID Take 100 mcg by mouth daily.   losartan 100 MG tablet Commonly known as: COZAAR Take 100 mg by mouth daily.   metoprolol succinate 50 MG 24 hr tablet Commonly known as: TOPROL-XL Take 50 mg by mouth daily. Take with or immediately following a meal.   pantoprazole 40 MG tablet Commonly known as: PROTONIX Take 40 mg by mouth every evening.      Follow-up Information    Josetta Huddle, MD Follow up in 1 week(s).   Specialty: Internal Medicine Contact information: [address] [place] [address] [phone]        Erline Levine, MD Follow up.   Specialty: Neurosurgery Why: head CT in one month hold ASA/plavix until seen by Dr. Dale Ormsby information: [address] [place] [address] [phone]        Jodi Marble, MD Follow up in 1 week(s).   Specialty: Otolaryngology Contact information: [address] [place] [address] [phone]            Time coordinating discharge: 35 min  Signed:  Geradine Girt DO  [facility] 09/22/2019,  9:33 AM

## 2019-09-22 NOTE — Progress Notes (Signed)
Patient ID: Courtney Shea, female   DOB: 11-12-1929, 85 y.o.   MRN: [id number] Vital signs are stable and patient is awake and alert Offers no complaints Ecchymosis left periorbital area remains unchanged Okay to discharge from neurosurgical standpoint

## 2019-09-22 NOTE — TOC Transition Note (Signed)
Transition of Care [facility]) - CM/SW Discharge Note   Patient Details  Name: Courtney Shea MRN: [id number] Date of Birth: May 07, 1930  [facility] CM/SW Contact:  Pollie Friar, RN Phone Number: 09/22/2019, 11:45 AM   Clinical Narrative:    Pt was active with outpatient therapy at [facility] prior to admission. OT is also recommending therapies at this time. CM spoke with the patient and she prefers [facility] services. [facility] selected and Cory with [facility] accepted the referral.  Pt has supervision at home and transportation to home.    Final next level of care: Home w Home Health Services Barriers to Discharge: No Barriers Identified   Patient Goals and CMS Choice   CMS Medicare.gov Compare Post Acute Care list provided to:: Patient Choice offered to / list presented to : Patient  Discharge Placement                       Discharge Plan and Services                          HH Arranged: PT, OT [facility] Agency: [place] Date [place]: 09/22/19   Representative spoke with at [place]: [place] (Edie) Interventions     Readmission Risk Interventions No flowsheet data found.

## 2019-09-22 NOTE — Plan of Care (Signed)
Patient stable, discussed POC with patient and daughter, agreeable with plan, denies question/concerns at this time.   Problem: Education: Goal: Knowledge of General Education information will improve Description: Including pain rating scale, medication(s)/side effects and non-pharmacologic comfort measures Outcome: Adequate for Discharge   Problem: Health Behavior/Discharge Planning: Goal: Ability to manage health-related needs will improve Outcome: Adequate for Discharge   Problem: Clinical Measurements: Goal: Ability to maintain clinical measurements within normal limits will improve Outcome: Adequate for Discharge Goal: Will remain Gustava Berland from infection Outcome: Adequate for Discharge Goal: Diagnostic test results will improve Outcome: Adequate for Discharge Goal: Respiratory complications will improve Outcome: Adequate for Discharge Goal: Cardiovascular complication will be avoided Outcome: Adequate for Discharge   Problem: Activity: Goal: Risk for activity intolerance will decrease Outcome: Adequate for Discharge   Problem: Nutrition: Goal: Adequate nutrition will be maintained Outcome: Adequate for Discharge   Problem: Coping: Goal: Level of anxiety will decrease Outcome: Adequate for Discharge   Problem: Elimination: Goal: Will not experience complications related to bowel motility Outcome: Adequate for Discharge Goal: Will not experience complications related to urinary retention Outcome: Adequate for Discharge   Problem: Pain Managment: Goal: General experience of comfort will improve Outcome: Adequate for Discharge   Problem: Safety: Goal: Ability to remain Shahd Occhipinti from injury will improve Outcome: Adequate for Discharge   Problem: Skin Integrity: Goal: Risk for impaired skin integrity will decrease Outcome: Adequate for Discharge

## 2019-09-26 ENCOUNTER — Encounter: Payer: Medicare Other | Admitting: Physical Therapy

## 2019-09-28 ENCOUNTER — Encounter: Payer: Medicare Other | Admitting: Physical Therapy

## 2019-10-04 ENCOUNTER — Other Ambulatory Visit (HOSPITAL_BASED_OUTPATIENT_CLINIC_OR_DEPARTMENT_OTHER): Payer: Self-pay | Admitting: Neurosurgery

## 2019-10-04 DIAGNOSIS — S065X9A Traumatic subdural hemorrhage with loss of consciousness of unspecified duration, initial encounter: Secondary | ICD-10-CM

## 2019-10-04 DIAGNOSIS — S065XAA Traumatic subdural hemorrhage with loss of consciousness status unknown, initial encounter: Secondary | ICD-10-CM

## 2019-10-12 ENCOUNTER — Ambulatory Visit (HOSPITAL_BASED_OUTPATIENT_CLINIC_OR_DEPARTMENT_OTHER): Payer: Medicare Other

## 2019-10-19 ENCOUNTER — Other Ambulatory Visit: Payer: Self-pay

## 2019-10-19 ENCOUNTER — Ambulatory Visit (HOSPITAL_BASED_OUTPATIENT_CLINIC_OR_DEPARTMENT_OTHER)
Admission: RE | Admit: 2019-10-19 | Discharge: 2019-10-19 | Disposition: A | Discharge status: Home / Self Care | Payer: Medicare Other | Source: Ambulatory Visit | Attending: Neurosurgery | Admitting: Neurosurgery

## 2019-10-19 DIAGNOSIS — S065XAA Traumatic subdural hemorrhage with loss of consciousness status unknown, initial encounter: Secondary | ICD-10-CM

## 2019-10-19 DIAGNOSIS — S065X9A Traumatic subdural hemorrhage with loss of consciousness of unspecified duration, initial encounter: Secondary | ICD-10-CM

## 2019-11-19 ENCOUNTER — Ambulatory Visit: Payer: Medicare Other | Attending: Internal Medicine

## 2019-11-19 DIAGNOSIS — Z23 Encounter for immunization: Secondary | ICD-10-CM

## 2019-11-19 NOTE — Progress Notes (Signed)
   Covid-19 Vaccination Clinic  Name:  Courtney Shea    MRN: [id number] DOB: [birth year]  11/19/2019  Courtney Shea was observed post Covid-19 immunization for 15 minutes without incidence. She was provided with Vaccine Information Sheet and instruction to access the V-Safe system.   Courtney Shea was instructed to call 911 with any severe reactions post vaccine: Marland Kitchen Difficulty breathing  . Swelling of your face and throat  . A fast heartbeat  . A bad rash all over your body  . Dizziness and weakness    Immunizations Administered    Name Date Dose VIS Date Route   Pfizer COVID-19 Vaccine 11/19/2019  2:31 PM 0.3 mL 10/07/2019 Intramuscular   Manufacturer: [place]   Lot: [id number]   [place]: [id number]

## 2019-11-23 ENCOUNTER — Observation Stay (HOSPITAL_COMMUNITY)
Admission: EM | Admit: 2019-11-23 | Discharge: 2019-11-25 | Disposition: A | Discharge status: Home / Self Care | Payer: Medicare Other | Attending: Internal Medicine | Admitting: Internal Medicine

## 2019-11-23 ENCOUNTER — Other Ambulatory Visit: Payer: Self-pay

## 2019-11-23 ENCOUNTER — Encounter (HOSPITAL_COMMUNITY): Payer: Self-pay | Admitting: *Deleted

## 2019-11-23 ENCOUNTER — Emergency Department (HOSPITAL_COMMUNITY): Payer: Medicare Other

## 2019-11-23 DIAGNOSIS — Z803 Family history of malignant neoplasm of breast: Secondary | ICD-10-CM | POA: Diagnosis not present

## 2019-11-23 DIAGNOSIS — K219 Gastro-esophageal reflux disease without esophagitis: Secondary | ICD-10-CM | POA: Insufficient documentation

## 2019-11-23 DIAGNOSIS — I739 Peripheral vascular disease, unspecified: Secondary | ICD-10-CM | POA: Diagnosis not present

## 2019-11-23 DIAGNOSIS — I119 Hypertensive heart disease without heart failure: Secondary | ICD-10-CM | POA: Diagnosis not present

## 2019-11-23 DIAGNOSIS — N3000 Acute cystitis without hematuria: Secondary | ICD-10-CM | POA: Diagnosis not present

## 2019-11-23 DIAGNOSIS — Z79811 Long term (current) use of aromatase inhibitors: Secondary | ICD-10-CM | POA: Diagnosis not present

## 2019-11-23 DIAGNOSIS — Z87820 Personal history of traumatic brain injury: Secondary | ICD-10-CM | POA: Insufficient documentation

## 2019-11-23 DIAGNOSIS — Z20822 Contact with and (suspected) exposure to covid-19: Secondary | ICD-10-CM | POA: Diagnosis not present

## 2019-11-23 DIAGNOSIS — Z882 Allergy status to sulfonamides status: Secondary | ICD-10-CM | POA: Diagnosis not present

## 2019-11-23 DIAGNOSIS — Z66 Do not resuscitate: Secondary | ICD-10-CM | POA: Diagnosis not present

## 2019-11-23 DIAGNOSIS — N390 Urinary tract infection, site not specified: Secondary | ICD-10-CM | POA: Diagnosis not present

## 2019-11-23 DIAGNOSIS — I472 Ventricular tachycardia: Secondary | ICD-10-CM | POA: Insufficient documentation

## 2019-11-23 DIAGNOSIS — Z8673 Personal history of transient ischemic attack (TIA), and cerebral infarction without residual deficits: Secondary | ICD-10-CM | POA: Diagnosis not present

## 2019-11-23 DIAGNOSIS — Z881 Allergy status to other antibiotic agents status: Secondary | ICD-10-CM | POA: Diagnosis not present

## 2019-11-23 DIAGNOSIS — E039 Hypothyroidism, unspecified: Secondary | ICD-10-CM | POA: Diagnosis not present

## 2019-11-23 DIAGNOSIS — M6281 Muscle weakness (generalized): Secondary | ICD-10-CM | POA: Insufficient documentation

## 2019-11-23 DIAGNOSIS — C50511 Malignant neoplasm of lower-outer quadrant of right female breast: Secondary | ICD-10-CM

## 2019-11-23 DIAGNOSIS — G459 Transient cerebral ischemic attack, unspecified: Principal | ICD-10-CM | POA: Insufficient documentation

## 2019-11-23 DIAGNOSIS — Z79899 Other long term (current) drug therapy: Secondary | ICD-10-CM | POA: Insufficient documentation

## 2019-11-23 DIAGNOSIS — C50212 Malignant neoplasm of upper-inner quadrant of left female breast: Secondary | ICD-10-CM | POA: Diagnosis not present

## 2019-11-23 DIAGNOSIS — M8580 Other specified disorders of bone density and structure, unspecified site: Secondary | ICD-10-CM | POA: Insufficient documentation

## 2019-11-23 DIAGNOSIS — I10 Essential (primary) hypertension: Secondary | ICD-10-CM | POA: Diagnosis present

## 2019-11-23 DIAGNOSIS — Z923 Personal history of irradiation: Secondary | ICD-10-CM | POA: Insufficient documentation

## 2019-11-23 DIAGNOSIS — R4701 Aphasia: Secondary | ICD-10-CM | POA: Diagnosis present

## 2019-11-23 DIAGNOSIS — Z8249 Family history of ischemic heart disease and other diseases of the circulatory system: Secondary | ICD-10-CM | POA: Diagnosis not present

## 2019-11-23 DIAGNOSIS — R2681 Unsteadiness on feet: Secondary | ICD-10-CM | POA: Insufficient documentation

## 2019-11-23 DIAGNOSIS — Z885 Allergy status to narcotic agent status: Secondary | ICD-10-CM | POA: Insufficient documentation

## 2019-11-23 DIAGNOSIS — Z170 Estrogen receptor positive status [ER+]: Secondary | ICD-10-CM | POA: Insufficient documentation

## 2019-11-23 DIAGNOSIS — Z79890 Hormone replacement therapy (postmenopausal): Secondary | ICD-10-CM | POA: Diagnosis not present

## 2019-11-23 DIAGNOSIS — E785 Hyperlipidemia, unspecified: Secondary | ICD-10-CM | POA: Diagnosis not present

## 2019-11-23 DIAGNOSIS — Z9181 History of falling: Secondary | ICD-10-CM | POA: Insufficient documentation

## 2019-11-23 LAB — CBC
HCT: 37.6 % (ref 36.0–46.0)
Hemoglobin: 12.6 g/dL (ref 12.0–15.0)
MCH: 29.4 pg (ref 26.0–34.0)
MCHC: 33.5 g/dL (ref 30.0–36.0)
MCV: 87.6 fL (ref 80.0–100.0)
Platelets: 280 10*3/uL (ref 150–400)
RBC: 4.29 MIL/uL (ref 3.87–5.11)
RDW: 12.5 % (ref 11.5–15.5)
WBC: 8.7 10*3/uL (ref 4.0–10.5)
nRBC: 0 % (ref 0.0–0.2)

## 2019-11-23 LAB — URINALYSIS, ROUTINE W REFLEX MICROSCOPIC
Bilirubin Urine: NEGATIVE
Glucose, UA: NEGATIVE mg/dL
Hgb urine dipstick: NEGATIVE
Ketones, ur: NEGATIVE mg/dL
Nitrite: POSITIVE — AB
Protein, ur: NEGATIVE mg/dL
Specific Gravity, Urine: 1.012 (ref 1.005–1.030)
pH: 6 (ref 5.0–8.0)

## 2019-11-23 LAB — I-STAT CHEM 8, ED
BUN: 15 mg/dL (ref 8–23)
Calcium, Ion: 1.1 mmol/L — ABNORMAL LOW (ref 1.15–1.40)
Chloride: 98 mmol/L (ref 98–111)
Creatinine, Ser: 0.6 mg/dL (ref 0.44–1.00)
Glucose, Bld: 133 mg/dL — ABNORMAL HIGH (ref 70–99)
HCT: 37 % (ref 36.0–46.0)
Hemoglobin: 12.6 g/dL (ref 12.0–15.0)
Potassium: 3.7 mmol/L (ref 3.5–5.1)
Sodium: 133 mmol/L — ABNORMAL LOW (ref 135–145)
TCO2: 26 mmol/L (ref 22–32)

## 2019-11-23 LAB — COMPREHENSIVE METABOLIC PANEL
ALT: 17 U/L (ref 0–44)
AST: 20 U/L (ref 15–41)
Albumin: 3.9 g/dL (ref 3.5–5.0)
Alkaline Phosphatase: 72 U/L (ref 38–126)
Anion gap: 13 (ref 5–15)
BUN: 15 mg/dL (ref 8–23)
CO2: 22 mmol/L (ref 22–32)
Calcium: 9.3 mg/dL (ref 8.9–10.3)
Chloride: 99 mmol/L (ref 98–111)
Creatinine, Ser: 0.65 mg/dL (ref 0.44–1.00)
GFR calc Af Amer: >60 mL/min (ref >60)
GFR calc non Af Amer: >60 mL/min (ref >60)
Glucose, Bld: 136 mg/dL — ABNORMAL HIGH (ref 70–99)
Potassium: 3.8 mmol/L (ref 3.5–5.1)
Sodium: 134 mmol/L — ABNORMAL LOW (ref 135–145)
Total Bilirubin: 0.2 mg/dL — ABNORMAL LOW (ref 0.3–1.2)
Total Protein: 6.5 g/dL (ref 6.5–8.1)

## 2019-11-23 LAB — DIFFERENTIAL
Abs Immature Granulocytes: 0.02 10*3/uL (ref 0.00–0.07)
Basophils Absolute: 0 10*3/uL (ref 0.0–0.1)
Basophils Relative: 0 %
Eosinophils Absolute: 0.1 10*3/uL (ref 0.0–0.5)
Eosinophils Relative: 1 %
Immature Granulocytes: 0 %
Lymphocytes Relative: 15 %
Lymphs Abs: 1.3 10*3/uL (ref 0.7–4.0)
Monocytes Absolute: 0.6 10*3/uL (ref 0.1–1.0)
Monocytes Relative: 7 %
Neutro Abs: 6.6 10*3/uL (ref 1.7–7.7)
Neutrophils Relative %: 77 %

## 2019-11-23 LAB — PROTIME-INR
INR: 1 (ref 0.8–1.2)
Prothrombin Time: 12.7 seconds (ref 11.4–15.2)

## 2019-11-23 LAB — RAPID URINE DRUG SCREEN, HOSP PERFORMED
Amphetamines: NOT DETECTED
Barbiturates: NOT DETECTED
Benzodiazepines: NOT DETECTED
Cocaine: NOT DETECTED
Opiates: NOT DETECTED
Tetrahydrocannabinol: NOT DETECTED

## 2019-11-23 LAB — APTT: aPTT: 26 seconds (ref 24–36)

## 2019-11-23 LAB — ETHANOL: Alcohol, Ethyl (B): <10 mg/dL (ref <10)

## 2019-11-23 MED ORDER — SODIUM CHLORIDE 0.9 % IV SOLN
1.0000 g | Freq: Once | INTRAVENOUS | Status: AC
  Administered 2019-11-23: 1 g via INTRAVENOUS

## 2019-11-23 MED ORDER — PANTOPRAZOLE SODIUM 40 MG PO TBEC
40.0000 mg | DELAYED_RELEASE_TABLET | Freq: Every evening | ORAL | Status: DC
  Administered 2019-11-24 – 2019-11-25 (×3): 40 mg via ORAL
  Filled 2019-11-23 (×3): qty 1

## 2019-11-23 MED ORDER — ACETAMINOPHEN 325 MG PO TABS
650.0000 mg | ORAL_TABLET | ORAL | Status: DC | PRN
  Administered 2019-11-24: 650 mg via ORAL
  Filled 2019-11-23: qty 2

## 2019-11-23 MED ORDER — ACETAMINOPHEN 650 MG RE SUPP: 650.0000 mg | RECTAL | Status: DC | PRN

## 2019-11-23 MED ORDER — LEVOTHYROXINE SODIUM 100 MCG PO TABS
100.0000 ug | ORAL_TABLET | Freq: Every day | ORAL | Status: DC
  Administered 2019-11-24 – 2019-11-25 (×2): 100 ug via ORAL
  Filled 2019-11-23 (×2): qty 1

## 2019-11-23 MED ORDER — STROKE: EARLY STAGES OF RECOVERY BOOK
Freq: Once | Status: AC
  Filled 2019-11-23: qty 1

## 2019-11-23 MED ORDER — SODIUM CHLORIDE 0.9 % IV SOLN
1.0000 g | INTRAVENOUS | Status: DC
  Administered 2019-11-24: 1 g via INTRAVENOUS
  Filled 2019-11-23 (×2): qty 10

## 2019-11-23 MED ORDER — ENOXAPARIN SODIUM 40 MG/0.4ML ~~LOC~~ SOLN
40.0000 mg | SUBCUTANEOUS | Status: DC
  Administered 2019-11-24 (×2): 40 mg via SUBCUTANEOUS
  Filled 2019-11-23 (×2): qty 0.4

## 2019-11-23 MED ORDER — ACETAMINOPHEN 160 MG/5ML PO SOLN: 650.0000 mg | ORAL | Status: DC | PRN

## 2019-11-23 MED ORDER — ANASTROZOLE 1 MG PO TABS
1.0000 mg | ORAL_TABLET | Freq: Every day | ORAL | Status: DC
  Administered 2019-11-24: 1 mg via ORAL
  Filled 2019-11-23 (×4): qty 1

## 2019-11-23 NOTE — ED Provider Notes (Signed)
[facility] EMERGENCY DEPARTMENT Provider Note   CSN: [id number] Arrival date & time: 11/23/19  1727     History Chief Complaint  Patient presents with  . Aphasia    Courtney Shea is a 86 y.o. female with history of GERD, hypertension, hypothyroidism, TIA 5 years ago for which she was placed on Plavix, subdural hematoma in November presents for evaluation of acute onset, resolved episode of aphasia.  She reports that at around 4 PM this afternoon after she had had lunch she was speaking with her husband when she reports that she had a very difficult time producing the words.  She knew what she wanted to say but she could not say it.  During this time she also felt some numbness in her right hand.  Her symptoms lasted approximately 15 minutes before resolving and have not recurred.  She has had a headache since she sustained a subdural hematoma in November, reports that she developed on this morning at around 10 AM and it has been consistent with the headaches she has been having since November.  She has been using a walker to ambulate since her fall in November and thinks her balance is worsening.  She denies any vision changes, nausea, vomiting, chest pain, shortness of breath, fevers, cough, numbness or weakness of the extremities at this time.  She is not on any blood thinners.  She has noted some dysuria and urinary frequency for the last couple of days and notes she is prone to UTIs.  The history is provided by the patient.       Past Medical History:  Diagnosis Date  . Breast cancer ([place])   . Breast cancer of lower-outer quadrant of right female breast ([place]) 05/07/2015  . GERD (gastroesophageal reflux disease)   . Hypertension   . Hypothyroid   . Personal history of radiation therapy   . Stroke [facility])    TIA, no deficits, placed on Plavix  . Syncope   . Ventricular bigeminy     Patient Active Problem List   Diagnosis Date Noted  . Subdural hematoma ([place])  09/21/2019  . SDH (subdural hematoma) ([place]) 09/21/2019  . Closed fracture of left orbit ([place])   . Malignant neoplasm of upper-inner quadrant of left breast in female, estrogen receptor positive ([place]) 07/14/2017  . Osteopenia 11/07/2015  . Carcinoma of upper-inner quadrant of left female breast ([place]) 07/03/2015  . TIA (transient ischemic attack) 06/20/2013  . Hypokalemia 06/20/2013  . Ventricular bigeminy 12/05/2011  . Leukocytosis 12/05/2011  . Syncope 12/04/2011  . Hypothyroidism 12/04/2011  . HTN (hypertension) 12/04/2011  . UTI (urinary tract infection) 12/04/2011  . PAIN IN LIMB 04/02/2007    Past Surgical History:  Procedure Laterality Date  . ABDOMINAL HYSTERECTOMY    . BLADDER SUSPENSION    . BREAST LUMPECTOMY Right    2016  . CATARACT EXTRACTION    . INCONTINENCE SURGERY    . Ovarian cyst removed    . PARTIAL MASTECTOMY WITH NEEDLE LOCALIZATION Bilateral 05/29/2015   Procedure: LEFT BREAST PARTIAL MASTECTOMY WITH NEEDLE LOCALIZATION TIMES 2 AND RIGHT BREAST PARTIAL MASTECTOMY WITH NEEDLE LOCALIZATION;  Surgeon: Erroll Luna, MD;  Location: [facility];  Service: General;  Laterality: Bilateral;  . RE-EXCISION OF BREAST LUMPECTOMY Left 06/21/2015   Procedure: LEFT BREAST RE EXCISION OF LUMPECTOMY;  Surgeon: Erroll Luna, MD;  Location: [facility];  Service: General;  Laterality: Left;     OB History   No obstetric  history on file.     Family History  Problem Relation Age of Onset  . Coronary artery disease Mother        MI at age 4  . Breast cancer Sister 55  . Kidney disease Brother   . Cancer Sister        colon cancer suggested    Social History   Tobacco Use  . Smoking status: Never Smoker  . Smokeless tobacco: Never Used  Substance Use Topics  . Alcohol use: No  . Drug use: No    Home Medications Prior to Admission medications   Medication Sig Start Date End Date Taking? Authorizing Provider  acetaminophen  (TYLENOL) 325 MG tablet Take 2 tablets (650 mg total) by mouth every 6 (six) hours as needed for mild pain (or Fever >/= 101). 09/22/19  Yes Vann, Jessica U, DO  anastrozole (ARIMIDEX) 1 MG tablet Take 1 tablet (1 mg total) by mouth daily. Patient taking differently: Take 1 mg by mouth at bedtime.  09/21/19  Yes Tanner, Lyndon Code., PA-C  Calcium Carb-Cholecalciferol (CALCIUM+D3 PO) Take 1 tablet by mouth daily with lunch.   Yes [provider]  cloNIDine (CATAPRES) 0.3 MG tablet Take 0.3 mg by mouth at bedtime. 08/25/19  Yes [provider]  Cranberry 500 MG CAPS Take 500 mg by mouth daily.   Yes [provider]  levothyroxine (SYNTHROID) 100 MCG tablet Take 100 mcg by mouth daily.    Yes [provider]  losartan (COZAAR) 100 MG tablet Take 100 mg by mouth daily.   Yes [provider]  metoprolol succinate (TOPROL-XL) 50 MG 24 hr tablet Take 50 mg by mouth daily. Take with or immediately following a meal.   Yes [provider]  pantoprazole (PROTONIX) 40 MG tablet Take 40 mg by mouth every evening.   Yes [provider]  amoxicillin-clavulanate (AUGMENTIN) 500-125 MG tablet Take 1 tablet (500 mg total) by mouth 2 (two) times daily. Patient not taking: Reported on 11/23/2019 09/22/19   Geradine Girt, DO  furosemide (LASIX) 20 MG tablet Take 1 tablet (20 mg total) by mouth daily as needed for edema. Patient not taking: Reported on 11/23/2019 09/22/19   Geradine Girt, DO  levETIRAcetam (KEPPRA) 500 MG tablet Take 1 tablet (500 mg total) by mouth 2 (two) times daily. Patient not taking: Reported on 11/23/2019 09/22/19   Geradine Girt, DO    Allergies    Codeine, Macrodantin [nitrofurantoin macrocrystal], Crestor [rosuvastatin calcium], Doxycycline, Hydrochlorothiazide, Norvasc [amlodipine besylate], and Sulfa antibiotics  Review of Systems   Review of Systems  Constitutional: Negative for chills and fever.  Respiratory: Negative for  shortness of breath.   Cardiovascular: Negative for chest pain.  Gastrointestinal: Negative for abdominal pain, nausea and vomiting.  Neurological: Positive for speech difficulty, numbness and headaches. Negative for facial asymmetry.  All other systems reviewed and are negative.   Physical Exam Updated Vital Signs BP (!) 155/78 (BP Location: Right Arm)   Pulse 79   Temp 98.1 F (36.7 C) (Oral)   Resp 15   Ht 5\' 4"  (1.626 m)   Wt 72.8 kg   SpO2 98%   BMI 27.55 kg/m   Physical Exam Vitals and nursing note reviewed.  Constitutional:      General: She is not in acute distress.    Appearance: She is well-developed.  HENT:     Head: Normocephalic and atraumatic.  Eyes:     General:  Right eye: No discharge.        Left eye: No discharge.     Extraocular Movements: Extraocular movements intact.     Conjunctiva/sclera: Conjunctivae normal.     Pupils: Pupils are equal, round, and reactive to light.  Neck:     Vascular: No JVD.     Trachea: No tracheal deviation.  Cardiovascular:     Rate and Rhythm: Normal rate and regular rhythm.  Pulmonary:     Effort: Pulmonary effort is normal.     Breath sounds: Normal breath sounds.  Abdominal:     General: Bowel sounds are normal. There is no distension.     Palpations: Abdomen is soft.     Tenderness: There is no abdominal tenderness. There is no guarding or rebound.  Musculoskeletal:     Cervical back: Neck supple.  Skin:    General: Skin is warm and dry.     Findings: No erythema.  Neurological:     Mental Status: She is alert.     Comments: Mental Status:  Alert, thought content appropriate, able to give a coherent history. Speech fluent without evidence of aphasia. Able to follow 2 step commands without difficulty.  Cranial Nerves:  II:  Peripheral visual fields grossly normal, pupils equal, round, reactive to light III,IV, VI: ptosis not present, extra-ocular motions intact bilaterally  V,VII: smile symmetric,  facial light touch sensation equal VIII: hearing grossly normal to voice  X: uvula elevates symmetrically  XI: bilateral shoulder shrug symmetric and strong XII: midline tongue extension without fassiculations Motor:  Normal tone. 5/5 strength of BUE and BLE major muscle groups including strong and equal grip strength and dorsiflexion/plantar flexion Sensory: light touch normal in all extremities. Cerebellar: normal finger-to-nose with bilateral upper extremities Gait: Ambulates with unsteady somewhat shuffling gait, reports this has been baseline since her fall in November.  Psychiatric:        Behavior: Behavior normal.     ED Results / Procedures / Treatments   Labs (all labs ordered are listed, but only abnormal results are displayed) Labs Reviewed  COMPREHENSIVE METABOLIC PANEL - Abnormal; Notable for the following components:      Result Value   Sodium 134 (*)    Glucose, Bld 136 (*)    Total Bilirubin 0.2 (*)    All other components within normal limits  URINALYSIS, ROUTINE W REFLEX MICROSCOPIC - Abnormal; Notable for the following components:   APPearance HAZY (*)    Nitrite POSITIVE (*)    Leukocytes,Ua MODERATE (*)    Bacteria, UA MANY (*)    All other components within normal limits  I-STAT CHEM 8, ED - Abnormal; Notable for the following components:   Sodium 133 (*)    Glucose, Bld 133 (*)    Calcium, Ion 1.10 (*)    All other components within normal limits  URINE CULTURE  SARS CORONAVIRUS 2 (TAT 6-24 HRS)  ETHANOL  PROTIME-INR  APTT  CBC  DIFFERENTIAL  RAPID URINE DRUG SCREEN, HOSP PERFORMED  HEMOGLOBIN A1C  LIPID PANEL    EKG None  Radiology CT HEAD WO CONTRAST  Result Date: 11/23/2019 CLINICAL DATA:  Transient ischemic attack. EXAM: CT HEAD WITHOUT CONTRAST TECHNIQUE: Contiguous axial images were obtained from the base of the skull through the vertex without intravenous contrast. COMPARISON:  10/19/2019.  12/20/2017.  12/04/2011 FINDINGS: Brain:  Age related volume loss. No focal abnormality affects brainstem or cerebellum. Cerebral hemispheres show chronic small-vessel change of the white matter. Ventricular prominence presumed  secondary 2 ex vacuo enlargement. Ventricular size is nearly stable going as far back as 2013. Vascular: There is atherosclerotic calcification of the major vessels at the base of the brain. Skull: Negative Sinuses/Orbits: Clear/normal Other: None IMPRESSION: No acute finding. Chronic atrophy and small-vessel ischemic change affecting the brain. Ventricular prominence presumed secondary to ex vacuo enlargement, fairly stable since 2013. Electronically Signed   By: Nelson Chimes M.D.   On: 11/23/2019 20:05    Procedures Procedures (including critical care time)  Medications Ordered in ED Medications  cefTRIAXone (ROCEPHIN) 1 g in sodium chloride 0.9 % 100 mL IVPB (has no administration in time range)  anastrozole (ARIMIDEX) tablet 1 mg (has no administration in time range)  levothyroxine (SYNTHROID) tablet 100 mcg (has no administration in time range)  pantoprazole (PROTONIX) EC tablet 40 mg (has no administration in time range)   stroke: mapping our early stages of recovery book (has no administration in time range)  acetaminophen (TYLENOL) tablet 650 mg (has no administration in time range)    Or  acetaminophen (TYLENOL) 160 MG/5ML solution 650 mg (has no administration in time range)    Or  acetaminophen (TYLENOL) suppository 650 mg (has no administration in time range)  enoxaparin (LOVENOX) injection 40 mg (has no administration in time range)  cefTRIAXone (ROCEPHIN) 1 g in sodium chloride 0.9 % 100 mL IVPB (0 g Intravenous Stopped 11/23/19 2118)    ED Course  I have reviewed the triage vital signs and the nursing notes.  Pertinent labs & imaging results that were available during my care of the patient were reviewed by me and considered in my medical decision making (see chart for details).    MDM  Rules/Calculators/A&P                      Patient presenting for evaluation of acute onset, resolved episode of aphasia with associated right upper extremity numbness.  Symptoms have resolved and she is completely asymptomatic on my examination.  She is afebrile, initially hypertensive with a little bit of improvement while in the ED.  No focal neurologic deficits on my assessment.  Lab work reviewed by me shows no leukocytosis, no anemia, no metabolic derangements, no renal insufficiency.  Her UA is concerning for UTI and she does have some urinary symptoms and notes she is prone to UTIs.  We will start her on IV Rocephin in the ED.  Head CT shows no acute intracranial abnormalities, no ICH, ICH, subdural hematoma noted on imaging today.  Her ABCD 2 score is 4 and she has risk factors for stroke.  CONSULT: Spoke with Dr. Lorraine Lax with neurology who agrees that patient would benefit from admission for TIA work-up.  He will see the patient in consultation during admission.  Spoke with Dr. Marlowe Sax with [facility] who agrees to assume care of patient and bring her into the hospital for further evaluation and management.   Final Clinical Impression(s) / ED Diagnoses Final diagnoses:  TIA (transient ischemic attack)  Aphasia  Acute cystitis without hematuria    Rx / DC Orders ED Discharge Orders    None       Renita Papa, PA-C 11/23/19 [place], [place] Mel, DO 11/24/19 2119

## 2019-11-23 NOTE — Consult Note (Signed)
Requesting Physician: Dr. Marlowe Sax    Chief Complaint: Transient episode of difficulty getting words out  History obtained from: Patient and Chart    HPI:                                                                                                                                       Courtney Shea is a 86 y.o. female with past medical history significant for breast cancer, hypertension, prior TIA previously on aspirin and Plavix, subdural hemorrhage in November 2020 presents to the ED with episode of difficulty getting words out.  Last known normal and onset of symptoms was 3:30 PM.  Patient states that she suddenly had difficulty getting words out when she was trying to speak.  Also associated right-sided numbness.  There was no facial droop or significant slurred speech.  The episode lasted for approximately 20 minutes.  Symptoms are completely resolved on arrival to the ER.  Patient's blood pressure is elevated.  Patient does state that she did have a headache during the episode.  Denies history of migraine  Work-up in ED included CT head which is negative for acute findings.  UA was positive for UTI.  Patient was admitted to medicine service for TIA work-up and neurology was consulted.  On chart review, patient presented in September 20 2019 after a fall and noted to have a 4 mm left hemispheric subdural hemorrhage with small amount of subarachnoid hemorrhage as well as a left fracture in the zygomatic arch and lateral left orbit.  At the time aspirin Plavix was held, per note the plan was to hold for 1 month.  Patient has not resumed any antiplatelets since then.   Past Medical History:  Diagnosis Date  . Breast cancer ([place])   . Breast cancer of lower-outer quadrant of right female breast ([place]) 05/07/2015  . GERD (gastroesophageal reflux disease)   . Hypertension   . Hypothyroid   . Personal history of radiation therapy   . Stroke [facility])    TIA, no deficits, placed on Plavix  .  Syncope   . Ventricular bigeminy     Past Surgical History:  Procedure Laterality Date  . ABDOMINAL HYSTERECTOMY    . BLADDER SUSPENSION    . BREAST LUMPECTOMY Right    2016  . CATARACT EXTRACTION    . INCONTINENCE SURGERY    . Ovarian cyst removed    . PARTIAL MASTECTOMY WITH NEEDLE LOCALIZATION Bilateral 05/29/2015   Procedure: LEFT BREAST PARTIAL MASTECTOMY WITH NEEDLE LOCALIZATION TIMES 2 AND RIGHT BREAST PARTIAL MASTECTOMY WITH NEEDLE LOCALIZATION;  Surgeon: Erroll Luna, MD;  Location: [facility];  Service: General;  Laterality: Bilateral;  . RE-EXCISION OF BREAST LUMPECTOMY Left 06/21/2015   Procedure: LEFT BREAST RE EXCISION OF LUMPECTOMY;  Surgeon: Erroll Luna, MD;  Location: [place];  Service: General;  Laterality: Left;    Family History  Problem Relation Age of Onset  . Coronary artery disease Mother        MI at age 25  . Breast cancer Sister 34  . Kidney disease Brother   . Cancer Sister        colon cancer suggested   Social History:  reports that she has never smoked. She has never used smokeless tobacco. She reports that she does not drink alcohol or use drugs.  Allergies:  Allergies  Allergen Reactions  . Codeine Nausea And Vomiting  . Macrodantin [Nitrofurantoin Macrocrystal] Nausea Only    "extreme nausea"  . Crestor [Rosuvastatin Calcium] Other (See Comments)    "aching in legs"  . Doxycycline Itching and Rash  . Hydrochlorothiazide Rash    "all over body rash"  . Norvasc [Amlodipine Besylate] Swelling    "swelling in ankles"  . Sulfa Antibiotics Nausea And Vomiting    Medications:                                                                                                                        I reviewed home medications.   ROS:                                                                                                                                     14 systems reviewed and negative except above     Examination:                                                                                                      General: Appears well-developed and well-nourished.  Psych: Affect appropriate to situation Eyes: No scleral injection HENT: No OP obstrucion Head: Normocephalic.  Cardiovascular: Normal rate and regular rhythm.  Respiratory: Effort normal and breath sounds normal to anterior ascultation GI: Soft.  No distension. There is no tenderness.  Skin: WDI    Neurological Examination Mental Status: Alert, oriented, thought content appropriate.  Speech fluent without evidence of aphasia. Able to follow 3  step commands without difficulty. Cranial Nerves: II: Visual fields grossly normal,  III,IV, VI: ptosis not present, extra-ocular motions intact bilaterally, pupils equal, round, reactive to light and accommodation V,VII: smile symmetric, facial light touch sensation normal bilaterally VIII: hearing normal bilaterally IX,X: uvula rises symmetrically XI: bilateral shoulder shrug XII: midline tongue extension Motor: Right : Upper extremity   5/5    Left:     Upper extremity   5/5  Lower extremity   5/5     Lower extremity   5/5 Tone and bulk:normal tone throughout; no atrophy noted Sensory: Pinprick and light touch intact throughout, bilaterally Deep Tendon Reflexes: 2+ and symmetric throughout Plantars: Right: downgoing   Left: downgoing Cerebellar: normal finger-to-nose, normal rapid alternating movements and normal heel-to-shin test Gait: normal gait and station     Lab Results: Basic Metabolic Panel: Recent Labs  Lab 11/23/19 1901 11/23/19 1928  NA 134* 133*  K 3.8 3.7  CL 99 98  CO2 22  --   GLUCOSE 136* 133*  BUN 15 15  CREATININE 0.65 0.60  CALCIUM 9.3  --     CBC: Recent Labs  Lab 11/23/19 1901 11/23/19 1928  WBC 8.7  --   NEUTROABS 6.6  --   HGB 12.6 12.6  HCT 37.6 37.0  MCV 87.6  --   PLT 280  --     Coagulation Studies: Recent Labs     11/23/19 1901  LABPROT 12.7  INR 1.0    Imaging: CT HEAD WO CONTRAST  Result Date: 11/23/2019 CLINICAL DATA:  Transient ischemic attack. EXAM: CT HEAD WITHOUT CONTRAST TECHNIQUE: Contiguous axial images were obtained from the base of the skull through the vertex without intravenous contrast. COMPARISON:  10/19/2019.  12/20/2017.  12/04/2011 FINDINGS: Brain: Age related volume loss. No focal abnormality affects brainstem or cerebellum. Cerebral hemispheres show chronic small-vessel change of the white matter. Ventricular prominence presumed secondary 2 ex vacuo enlargement. Ventricular size is nearly stable going as far back as 2013. Vascular: There is atherosclerotic calcification of the major vessels at the base of the brain. Skull: Negative Sinuses/Orbits: Clear/normal Other: None IMPRESSION: No acute finding. Chronic atrophy and small-vessel ischemic change affecting the brain. Ventricular prominence presumed secondary to ex vacuo enlargement, fairly stable since 2013. Electronically Signed   By: Nelson Chimes M.D.   On: 11/23/2019 20:05     I have reviewed the above imaging   ASSESSMENT AND PLAN  86 y.o. female with past medical history significant for breast cancer, hypertension, prior TIA previously on aspirin and Plavix, subdural hemorrhage in November 2020 presents to the ED with episode of difficulty getting words out as well as right-sided numbness.  Based on description of symptoms, very concerning for TIA.  Unlikely to be seizure as patient is alert the entire episode.  Unclear as to why patient was on dual antiplatelets in the beginning.  TIA was in 2014.  I did not see any note from cardiology who would recommend dual antiplatelet therapy. Subdural hemorrhage occurred in the setting of trauma, she is now more than 1 month out and no hemorrhage seen.  Would resume antiplatelet therapy.  She also is not on statins now because of intolerance.  Given symptoms of aphasia and  patient's age, paroxysmal atrial fibrillation should also be considered as cause for TIA.  If remaining work-up negative, consider 30 Day Loop monitor on discharge.  Transient Ischemic Attack # MRI of the brain without contrast #MRA Head and carotid Doppler #Transthoracic Echo  #  We can resume aspirin 81 mg.  Would hold off starting dual antiplatelets #Consider Zetia/low-dose of statin along with coenzyme Q 10. # BP goal: permissive HTN upto 220/120 mmHg ( 185/110 if patient has CHF, CKD) # HBAIC and Lipid profile # Telemetry monitoring # Frequent neuro checks # NPO until passes stroke swallow screen  Please page stroke NP  Or  PA  Or MD from 8am -4 pm  as this patient from this time will be  followed by the stroke.   You can look them up on [email or web address]  Password [facility]   Dont[place] [facility] Pager Number [phone]

## 2019-11-23 NOTE — ED Notes (Signed)
Jonita Albee [phone] mother for updates

## 2019-11-23 NOTE — H&P (Signed)
History and Physical    Courtney Shea [id number] DOB: 01-17-1930 DOA: 11/23/2019  PCP: Josetta Huddle, MD Patient coming from: Home  Chief Complaint: Aphasia, R hand numbness  HPI: Courtney Shea is a 86 y.o. female with medical history significant of breast cancer, hypertension, hypothyroidism, history of prior TIA, subdural hematoma in November 2020 presenting to the ED for evaluation of aphasia and right arm numbness.  Per EMS report, patient's husband noticed that she was having a hard time getting words out.  Last seen normal at 1530.  Symptoms resolved in 20 minutes.  Patient states around lunchtime she experienced difficulty getting words out when she was trying to speak.  Her right hand felt numb at that time.  No focal weakness.  Symptoms lasted approximately 20 minutes.  States she had a similar episode about 5 years ago and was started on aspirin and Plavix after that event.  These medications were stopped in November 2020 after she had a brain bleed.  States she was previously taking Crestor which was stopped a few years ago because it caused her to have muscle pains.  Denies any fevers, cough, shortness of breath, or recent illness.   ED Course: Blood pressure elevated, remainder of vital signs stable.  CBC unremarkable.  Blood ethanol level undetectable.  UDS negative.  UA with positive nitrite, moderate amount of leukocytes, 21-50 WBCs, and many bacteria.  Urine culture pending.  SARS-CoV-2 PCR test pending.  Head CT negative for acute intracranial abnormality. Patient received ceftriaxone.  Neurology consulted.  Review of Systems:  All systems reviewed and apart from history of presenting illness, are negative.  Past Medical History:  Diagnosis Date  . Breast cancer ([place])   . Breast cancer of lower-outer quadrant of right female breast ([place]) 05/07/2015  . GERD (gastroesophageal reflux disease)   . Hypertension   . Hypothyroid   . Personal history of radiation therapy     . Stroke [facility])    TIA, no deficits, placed on Plavix  . Syncope   . Ventricular bigeminy     Past Surgical History:  Procedure Laterality Date  . ABDOMINAL HYSTERECTOMY    . BLADDER SUSPENSION    . BREAST LUMPECTOMY Right    2016  . CATARACT EXTRACTION    . INCONTINENCE SURGERY    . Ovarian cyst removed    . PARTIAL MASTECTOMY WITH NEEDLE LOCALIZATION Bilateral 05/29/2015   Procedure: LEFT BREAST PARTIAL MASTECTOMY WITH NEEDLE LOCALIZATION TIMES 2 AND RIGHT BREAST PARTIAL MASTECTOMY WITH NEEDLE LOCALIZATION;  Surgeon: Erroll Luna, MD;  Location: [place];  Service: General;  Laterality: Bilateral;  . RE-EXCISION OF BREAST LUMPECTOMY Left 06/21/2015   Procedure: LEFT BREAST RE EXCISION OF LUMPECTOMY;  Surgeon: Erroll Luna, MD;  Location: [place];  Service: General;  Laterality: Left;     reports that she has never smoked. She has never used smokeless tobacco. She reports that she does not drink alcohol or use drugs.  Allergies  Allergen Reactions  . Codeine Nausea And Vomiting  . Macrodantin [Nitrofurantoin Macrocrystal] Nausea Only    "extreme nausea"  . Crestor [Rosuvastatin Calcium] Other (See Comments)    "aching in legs"  . Doxycycline Itching and Rash  . Hydrochlorothiazide Rash    "all over body rash"  . Norvasc [Amlodipine Besylate] Swelling    "swelling in ankles"  . Sulfa Antibiotics Nausea And Vomiting    Family History  Problem Relation Age of Onset  . Coronary artery disease Mother  MI at age 54  . Breast cancer Sister 58  . Kidney disease Brother   . Cancer Sister        colon cancer suggested    Prior to Admission medications   Medication Sig Start Date End Date Taking? Authorizing Provider  acetaminophen (TYLENOL) 325 MG tablet Take 2 tablets (650 mg total) by mouth every 6 (six) hours as needed for mild pain (or Fever >/= 101). 09/22/19  Yes Vann, Jessica U, DO  anastrozole (ARIMIDEX) 1 MG tablet Take 1  tablet (1 mg total) by mouth daily. Patient taking differently: Take 1 mg by mouth at bedtime.  09/21/19  Yes Tanner, Lyndon Code., PA-C  Calcium Carb-Cholecalciferol (CALCIUM+D3 PO) Take 1 tablet by mouth daily with lunch.   Yes [provider]  cloNIDine (CATAPRES) 0.3 MG tablet Take 0.3 mg by mouth at bedtime. 08/25/19  Yes [provider]  Cranberry 500 MG CAPS Take 500 mg by mouth daily.   Yes [provider]  levothyroxine (SYNTHROID) 100 MCG tablet Take 100 mcg by mouth daily.    Yes [provider]  losartan (COZAAR) 100 MG tablet Take 100 mg by mouth daily.   Yes [provider]  metoprolol succinate (TOPROL-XL) 50 MG 24 hr tablet Take 50 mg by mouth daily. Take with or immediately following a meal.   Yes [provider]  pantoprazole (PROTONIX) 40 MG tablet Take 40 mg by mouth every evening.   Yes [provider]  amoxicillin-clavulanate (AUGMENTIN) 500-125 MG tablet Take 1 tablet (500 mg total) by mouth 2 (two) times daily. Patient not taking: Reported on 11/23/2019 09/22/19   Geradine Girt, DO  furosemide (LASIX) 20 MG tablet Take 1 tablet (20 mg total) by mouth daily as needed for edema. Patient not taking: Reported on 11/23/2019 09/22/19   Geradine Girt, DO  levETIRAcetam (KEPPRA) 500 MG tablet Take 1 tablet (500 mg total) by mouth 2 (two) times daily. Patient not taking: Reported on 11/23/2019 09/22/19   Geradine Girt, DO    Physical Exam: Vitals:   11/23/19 1745 11/23/19 1752 11/23/19 2259  BP: (!) 174/79  (!) 155/78  Pulse: 74  79  Resp: 15  15  Temp: 98.1 F (36.7 C)    TempSrc: Oral    SpO2: 97%  98%  Weight:  72.8 kg   Height:  5\' 4"  (1.626 m)     Physical Exam  Constitutional: She is oriented to person, place, and time. She appears well-developed and well-nourished. No distress.  HENT:  Head: Normocephalic.  Eyes: EOM are normal. Right eye exhibits no discharge. Left eye exhibits no discharge.    Cardiovascular: Normal rate, regular rhythm and intact distal pulses.  Pulmonary/Chest: Effort normal and breath sounds normal. No respiratory distress. She has no wheezes. She has no rales.  Abdominal: Soft. Bowel sounds are normal. She exhibits no distension. There is no abdominal tenderness. There is no guarding.  Musculoskeletal:        General: Edema present.     Cervical back: Neck supple.     Comments: +1 pedal edema (chronic per patient)  Neurological: She is alert and oriented to person, place, and time. No cranial nerve deficit.  Speech fluent, tongue midline, no facial droop Strength 5 out of 5 in bilateral upper and lower extremities. Sensation to light touch intact throughout.  Skin: Skin is warm and dry. She is not diaphoretic.     Labs on Admission: I have personally reviewed following  labs and imaging studies  CBC: Recent Labs  Lab 11/23/19 1901 11/23/19 1928  WBC 8.7  --   NEUTROABS 6.6  --   HGB 12.6 12.6  HCT 37.6 37.0  MCV 87.6  --   PLT 280  --    Basic Metabolic Panel: Recent Labs  Lab 11/23/19 1901 11/23/19 1928  NA 134* 133*  K 3.8 3.7  CL 99 98  CO2 22  --   GLUCOSE 136* 133*  BUN 15 15  CREATININE 0.65 0.60  CALCIUM 9.3  --    GFR: Estimated Creatinine Clearance: 48.4 mL/min (by C-G formula based on SCr of 0.6 mg/dL). Liver Function Tests: Recent Labs  Lab 11/23/19 1901  AST 20  ALT 17  ALKPHOS 72  BILITOT 0.2*  PROT 6.5  ALBUMIN 3.9   No results for input(s): LIPASE, AMYLASE in the last 168 hours. No results for input(s): AMMONIA in the last 168 hours. Coagulation Profile: Recent Labs  Lab 11/23/19 1901  INR 1.0   Cardiac Enzymes: No results for input(s): CKTOTAL, CKMB, CKMBINDEX, TROPONINI in the last 168 hours. BNP (last 3 results) No results for input(s): PROBNP in the last 8760 hours. HbA1C: No results for input(s): HGBA1C in the last 72 hours. CBG: No results for input(s): GLUCAP in the last 168 hours. Lipid  Profile: No results for input(s): CHOL, HDL, LDLCALC, TRIG, CHOLHDL, LDLDIRECT in the last 72 hours. Thyroid Function Tests: No results for input(s): TSH, T4TOTAL, FREET4, T3FREE, THYROIDAB in the last 72 hours. Anemia Panel: No results for input(s): VITAMINB12, FOLATE, FERRITIN, TIBC, IRON, RETICCTPCT in the last 72 hours. Urine analysis:    Component Value Date/Time   COLORURINE YELLOW 11/23/2019 1901   APPEARANCEUR HAZY (A) 11/23/2019 1901   LABSPEC 1.012 11/23/2019 1901   PHURINE 6.0 11/23/2019 1901   GLUCOSEU NEGATIVE 11/23/2019 1901   HGBUR NEGATIVE 11/23/2019 1901   BILIRUBINUR NEGATIVE 11/23/2019 1901   KETONESUR NEGATIVE 11/23/2019 1901   PROTEINUR NEGATIVE 11/23/2019 1901   UROBILINOGEN 0.2 06/20/2013 0910   NITRITE POSITIVE (A) 11/23/2019 1901   LEUKOCYTESUR MODERATE (A) 11/23/2019 1901    Radiological Exams on Admission: CT HEAD WO CONTRAST  Result Date: 11/23/2019 CLINICAL DATA:  Transient ischemic attack. EXAM: CT HEAD WITHOUT CONTRAST TECHNIQUE: Contiguous axial images were obtained from the base of the skull through the vertex without intravenous contrast. COMPARISON:  10/19/2019.  12/20/2017.  12/04/2011 FINDINGS: Brain: Age related volume loss. No focal abnormality affects brainstem or cerebellum. Cerebral hemispheres show chronic small-vessel change of the white matter. Ventricular prominence presumed secondary 2 ex vacuo enlargement. Ventricular size is nearly stable going as far back as 2013. Vascular: There is atherosclerotic calcification of the major vessels at the base of the brain. Skull: Negative Sinuses/Orbits: Clear/normal Other: None IMPRESSION: No acute finding. Chronic atrophy and small-vessel ischemic change affecting the brain. Ventricular prominence presumed secondary to ex vacuo enlargement, fairly stable since 2013. Electronically Signed   By: Nelson Chimes M.D.   On: 11/23/2019 20:05    EKG: Pending at this time.  Assessment/Plan Principal  Problem:   TIA (transient ischemic attack) Active Problems:   Hypothyroidism   HTN (hypertension)   UTI (urinary tract infection)   Carcinoma of upper-inner quadrant of left female breast [facility])   TIA Patient is presenting with complaints of aphasia and right hand numbness.  Symptoms lasted approximately 20 minutes and currently back to baseline.  No focal neuro deficit on exam at this time. Head CT negative for acute intracranial  abnormality.  Patient has a history of prior TIA for which she was on aspirin and Plavix which were stopped in November 2020 after she had a subdural hematoma and subarachnoid hemorrhage after a fall. -Telemetry monitoring -MRI of the brain without contrast -2D echocardiogram -Hemoglobin A1c, fasting lipid panel -Frequent neurochecks -PT, OT, speech therapy. -N.p.o. until cleared by bedside swallow evaluation or formal speech evaluation -Neurology consulted -Antiplatelet therapy per neurology recommendations -Patient reports history of statin intolerance  UTI UA with positive nitrite, moderate amount of leukocytes, 21-50 WBCs, and many bacteria.  Afebrile and no leukocytosis.  No signs of sepsis. -Continue ceftriaxone -Urine culture pending  Hypertension -Allow permissive hypertension at this time.  Brain MRI pending.  History of breast cancer Status post lumpectomy and radiation in 2016. -Continue home Arimidex  Hypothyroidism -Continue Synthroid  DVT prophylaxis: Lovenox Code Status: Patient wishes to be DNR. Family Communication: No family available at this time. Disposition Plan: Anticipate discharge after clinical improvement. Consults called: Neurology Admission status: It is my clinical opinion that referral for OBSERVATION is reasonable and necessary in this patient based on the above information provided. The aforementioned taken together are felt to place the patient at high risk for further clinical deterioration. However it is anticipated  that the patient may be medically stable for discharge from the hospital within 24 to 48 hours.  The medical decision making on this patient was of high complexity and the patient is at high risk for clinical deterioration, therefore this is a level 3 visit.  Shela Leff MD [facility]  If 7PM-7AM, please contact night-coverage [email or web address] Password [facility]  11/23/2019, 11:15 PM

## 2019-11-23 NOTE — ED Notes (Signed)
Please call daughter [phone] [facility]

## 2019-11-23 NOTE — ED Triage Notes (Addendum)
Pt here via [facility] from home for a 20 min episode of slurred speech, aphasia and R arm numbness.  Per [facility], pt's husband did not notice a change in her speech, but she felt she was having a hard time getting words out. LSN 1530.  All s/s have resolved.  Pt had a fall in Nov and since then she has had to use a walker.  She states she was sent home on seizure meds at that time.  Pt had Covid vaccine #1 on Sat.

## 2019-11-24 ENCOUNTER — Observation Stay (HOSPITAL_COMMUNITY): Payer: Medicare Other

## 2019-11-24 ENCOUNTER — Encounter (HOSPITAL_COMMUNITY): Payer: Self-pay | Admitting: Internal Medicine

## 2019-11-24 ENCOUNTER — Observation Stay (HOSPITAL_BASED_OUTPATIENT_CLINIC_OR_DEPARTMENT_OTHER): Payer: Medicare Other

## 2019-11-24 DIAGNOSIS — Z8673 Personal history of transient ischemic attack (TIA), and cerebral infarction without residual deficits: Secondary | ICD-10-CM

## 2019-11-24 DIAGNOSIS — G459 Transient cerebral ischemic attack, unspecified: Secondary | ICD-10-CM | POA: Diagnosis not present

## 2019-11-24 DIAGNOSIS — I361 Nonrheumatic tricuspid (valve) insufficiency: Secondary | ICD-10-CM | POA: Diagnosis not present

## 2019-11-24 DIAGNOSIS — N3000 Acute cystitis without hematuria: Secondary | ICD-10-CM | POA: Diagnosis not present

## 2019-11-24 LAB — HEMOGLOBIN A1C
Hgb A1c MFr Bld: 5.5 % (ref 4.8–5.6)
Mean Plasma Glucose: 111.15 mg/dL

## 2019-11-24 LAB — LIPID PANEL
Cholesterol: 164 mg/dL (ref 0–200)
HDL: 48 mg/dL (ref >40)
LDL Cholesterol: 101 mg/dL — ABNORMAL HIGH (ref 0–99)
Total CHOL/HDL Ratio: 3.4 RATIO
Triglycerides: 73 mg/dL (ref <150)
VLDL: 15 mg/dL (ref 0–40)

## 2019-11-24 LAB — SARS CORONAVIRUS 2 (TAT 6-24 HRS): SARS Coronavirus 2: NEGATIVE

## 2019-11-24 LAB — ECHOCARDIOGRAM COMPLETE
Height: 64 in
Weight: 2567.92 oz

## 2019-11-24 MED ORDER — SIMETHICONE 80 MG PO CHEW: 80.0000 mg | CHEWABLE_TABLET | Freq: Four times a day (QID) | ORAL | Status: DC | PRN

## 2019-11-24 MED ORDER — ATORVASTATIN CALCIUM 10 MG PO TABS
20.0000 mg | ORAL_TABLET | Freq: Every day | ORAL | Status: DC
  Administered 2019-11-24 – 2019-11-25 (×2): 20 mg via ORAL
  Filled 2019-11-24 (×2): qty 2

## 2019-11-24 MED ORDER — SODIUM CHLORIDE 0.9 % IV SOLN
INTRAVENOUS | Status: DC | PRN
  Administered 2019-11-24: 250 mL via INTRAVENOUS

## 2019-11-24 MED ORDER — CLOPIDOGREL BISULFATE 75 MG PO TABS
75.0000 mg | ORAL_TABLET | Freq: Every day | ORAL | Status: DC
  Administered 2019-11-24 – 2019-11-25 (×2): 75 mg via ORAL
  Filled 2019-11-24 (×2): qty 1

## 2019-11-24 MED ORDER — METOPROLOL SUCCINATE ER 50 MG PO TB24
50.0000 mg | ORAL_TABLET | Freq: Every day | ORAL | Status: DC
  Administered 2019-11-24 – 2019-11-25 (×2): 50 mg via ORAL
  Filled 2019-11-24 (×2): qty 1

## 2019-11-24 MED ORDER — CLONIDINE HCL 0.1 MG PO TABS
0.3000 mg | ORAL_TABLET | Freq: Every day | ORAL | Status: DC
  Administered 2019-11-24: 0.3 mg via ORAL
  Filled 2019-11-24: qty 3

## 2019-11-24 MED ORDER — ASPIRIN EC 81 MG PO TBEC
81.0000 mg | DELAYED_RELEASE_TABLET | Freq: Every day | ORAL | Status: DC
  Administered 2019-11-24 – 2019-11-25 (×2): 81 mg via ORAL
  Filled 2019-11-24 (×2): qty 1

## 2019-11-24 MED ORDER — HYDRALAZINE HCL 25 MG PO TABS
25.0000 mg | ORAL_TABLET | Freq: Once | ORAL | Status: AC
  Administered 2019-11-24: 25 mg via ORAL
  Filled 2019-11-24: qty 1

## 2019-11-24 MED ORDER — HYDRALAZINE HCL 25 MG PO TABS
25.0000 mg | ORAL_TABLET | Freq: Once | ORAL | Status: AC
  Administered 2019-11-24: 22:00:00 25 mg via ORAL
  Filled 2019-11-24: qty 1

## 2019-11-24 MED ORDER — LOSARTAN POTASSIUM 50 MG PO TABS
100.0000 mg | ORAL_TABLET | Freq: Every day | ORAL | Status: DC
  Administered 2019-11-24 – 2019-11-25 (×2): 100 mg via ORAL
  Filled 2019-11-24 (×2): qty 2

## 2019-11-24 MED ORDER — PERFLUTREN LIPID MICROSPHERE
1.0000 mL | INTRAVENOUS | Status: AC | PRN
  Administered 2019-11-24: 5 mL via INTRAVENOUS
  Filled 2019-11-24: qty 10

## 2019-11-24 NOTE — Progress Notes (Signed)
  Echocardiogram 2D Echocardiogram has been performed with [facility].  Courtney Shea 11/24/2019, 12:04 PM

## 2019-11-24 NOTE — H&P (View-Only) (Signed)
ELECTROPHYSIOLOGY CONSULT NOTE  Patient ID: Courtney Shea MRN: [id number], DOB/AGE: [birth year]   Admit date: 11/23/2019 Date of Consult: 11/25/19   Primary Physician: Josetta Huddle, MD Primary Cardiologist: No primary care provider on file.  Primary Electrophysiologist: New to Dr. Rayann Heman Reason for Consultation: Cryptogenic stroke; recommendations regarding Implantable Loop Recorder Insurance: [facility] Medicare  History of Present Illness EP has been asked to evaluate Courtney Shea for placement of an implantable loop recorder to monitor for atrial fibrillation by Dr Erlinda Hong.  The patient was admitted on 11/23/2019 with 11/23/2019.  They first developed symptoms while at home having lunch. She had word finding difficulty and dysphasia with R hand numbness for ~ 20 minutes. Previously started on ASA/Plavix about 5 years ago for similar. (These were stopped in 08/2019 due to brain bleed after a mechanical fall. Imaging this admission demonstrated no acute infarction, hemorrhage, or mass. Thought to be TIA. They have undergone workup for stroke including echocardiogram and carotid dopplers that showed B 1-39% stenosis.  The patient has been monitored on telemetry which has demonstrated sinus rhythm and SVT/PVCs with NSVT.  Inpatient stroke work-up will not require a TEE per Neurology.   Echocardiogram this admission demonstrated 55-60%.  Lab work is reviewed.  She is presently undergoing DVT US.   Prior to admission, the patient denies chest pain, shortness of breath, dizziness, palpitations, or syncope.  They are recovering from their stroke with plans to return home  at discharge.  Past Medical History:  Diagnosis Date   Breast cancer [facility])    Breast cancer of lower-outer quadrant of right female breast ([place]) 05/07/2015   GERD (gastroesophageal reflux disease)    Hypertension    Hypothyroid    Personal history of radiation therapy    Stroke ([place])    TIA, no deficits, placed on  Plavix   Syncope    Ventricular bigeminy      Surgical History:  Past Surgical History:  Procedure Laterality Date   ABDOMINAL HYSTERECTOMY     BLADDER SUSPENSION     BREAST LUMPECTOMY Right    2016   CATARACT EXTRACTION     INCONTINENCE SURGERY     Ovarian cyst removed     PARTIAL MASTECTOMY WITH NEEDLE LOCALIZATION Bilateral 05/29/2015   Procedure: LEFT BREAST PARTIAL MASTECTOMY WITH NEEDLE LOCALIZATION TIMES 2 AND RIGHT BREAST PARTIAL MASTECTOMY WITH NEEDLE LOCALIZATION;  Surgeon: Erroll Luna, MD;  Location: [place];  Service: General;  Laterality: Bilateral;   RE-EXCISION OF BREAST LUMPECTOMY Left 06/21/2015   Procedure: LEFT BREAST RE EXCISION OF LUMPECTOMY;  Surgeon: Erroll Luna, MD;  Location: [place];  Service: General;  Laterality: Left;     Medications Prior to Admission  Medication Sig Dispense Refill Last Dose   acetaminophen (TYLENOL) 325 MG tablet Take 2 tablets (650 mg total) by mouth every 6 (six) hours as needed for mild pain (or Fever >/= 101).   unk at unk   anastrozole (ARIMIDEX) 1 MG tablet Take 1 tablet (1 mg total) by mouth daily. (Patient taking differently: Take 1 mg by mouth at bedtime. ) 90 tablet 3 11/22/2019 at pm   Calcium Carb-Cholecalciferol (CALCIUM+D3 PO) Take 1 tablet by mouth daily with lunch.   11/23/2019 at 1200   cloNIDine (CATAPRES) 0.3 MG tablet Take 0.3 mg by mouth at bedtime.   11/22/2019 at pm   Cranberry 500 MG CAPS Take 500 mg by mouth daily.   11/23/2019 at Unknown time   levothyroxine (  SYNTHROID) 100 MCG tablet Take 100 mcg by mouth daily.    11/23/2019 at am   losartan (COZAAR) 100 MG tablet Take 100 mg by mouth daily.   11/23/2019 at am   metoprolol succinate (TOPROL-XL) 50 MG 24 hr tablet Take 50 mg by mouth daily. Take with or immediately following a meal.   11/23/2019 at 0700   pantoprazole (PROTONIX) 40 MG tablet Take 40 mg by mouth every evening.   11/22/2019 at [pm    amoxicillin-clavulanate (AUGMENTIN) 500-125 MG tablet Take 1 tablet (500 mg total) by mouth 2 (two) times daily. (Patient not taking: Reported on 11/23/2019) 14 tablet 0 Not Taking at Unknown time   furosemide (LASIX) 20 MG tablet Take 1 tablet (20 mg total) by mouth daily as needed for edema. (Patient not taking: Reported on 11/23/2019) 30 tablet  Not Taking at Unknown time   levETIRAcetam (KEPPRA) 500 MG tablet Take 1 tablet (500 mg total) by mouth 2 (two) times daily. (Patient not taking: Reported on 11/23/2019) 14 tablet 0 Not Taking at Unknown time    Inpatient Medications:   anastrozole  1 mg Oral QHS   aspirin EC  81 mg Oral Daily   atorvastatin  20 mg Oral q1800   cloNIDine  0.3 mg Oral QHS   clopidogrel  75 mg Oral Daily   enoxaparin (LOVENOX) injection  40 mg Subcutaneous Q24H   levothyroxine  100 mcg Oral Q0600   losartan  100 mg Oral Daily   metoprolol succinate  50 mg Oral Daily   pantoprazole  40 mg Oral QPM    Allergies:  Allergies  Allergen Reactions   Codeine Nausea And Vomiting   Macrodantin [Nitrofurantoin Macrocrystal] Nausea Only    "extreme nausea"   Crestor [Rosuvastatin Calcium] Other (See Comments)    "aching in legs"   Doxycycline Itching and Rash   Hydrochlorothiazide Rash    "all over body rash"   Norvasc [Amlodipine Besylate] Swelling    "swelling in ankles"   Sulfa Antibiotics Nausea And Vomiting    Social History   Socioeconomic History   Marital status: Married    Spouse name: Not on file   Number of children: Not on file   Years of education: Not on file   Highest education level: Not on file  Occupational History   Not on file  Tobacco Use   Smoking status: Never Smoker   Smokeless tobacco: Never Used  Substance and Sexual Activity   Alcohol use: No   Drug use: No   Sexual activity: Never    Birth control/protection: Post-menopausal  Other Topics Concern   Not on file  Social History Narrative   Not  on file   Social Determinants of Health   Financial Resource Strain:    Difficulty of Paying Living Expenses: Not on file  Food Insecurity:    Worried About Charity fundraiser in the Last Year: Not on file   [employer] of Food in the Last Year: Not on file  Transportation Needs:    Lack of Transportation (Medical): Not on file   Lack of Transportation (Non-Medical): Not on file  Physical Activity:    Days of Exercise per Week: Not on file   Minutes of Exercise per Session: Not on file  Stress:    Feeling of Stress : Not on file  Social Connections:    Frequency of Communication with Friends and Family: Not on file   Frequency of Social Gatherings with Friends and Family:  Not on file   Attends Religious Services: Not on file   Active Member of Clubs or Organizations: Not on file   Attends Club or Organization Meetings: Not on file   Marital Status: Not on file  Intimate Partner Violence:    Fear of Current or Ex-Partner: Not on file   Emotionally Abused: Not on file   Physically Abused: Not on file   Sexually Abused: Not on file     Family History  Problem Relation Age of Onset   Coronary artery disease Mother        MI at age 53   Breast cancer Sister 32   Kidney disease Brother    Cancer Sister        colon cancer suggested      Review of Systems: All other systems reviewed and are otherwise negative except as noted above.  Physical Exam: Vitals:   11/24/19 0842 11/24/19 0937 11/24/19 1036 11/24/19 1207  BP: (!) 172/101 (!) 185/95 (!) 184/99 (!) 119/108  Pulse: 67 78  84  Resp: 16 16 19 14   Temp: 98.4 F (36.9 C)   98.2 F (36.8 C)  TempSrc: Oral   Oral  SpO2: 98% 99%  95%  Weight:      Height:        GEN- The patient is well appearing, alert and oriented x 3 today.   Head- normocephalic, atraumatic Eyes-  Sclera clear, conjunctiva pink Ears- hearing intact Oropharynx- clear Neck- supple Lungs- Clear to ausculation bilaterally,  normal work of breathing Heart- Regular rate and rhythm, no murmurs, rubs or gallops  GI- soft, NT, ND, + BS Extremities- no clubbing, cyanosis, or edema MS- no significant deformity or atrophy Skin- no rash or lesion Psych- euthymic mood, full affect   Labs:   Lab Results  Component Value Date   WBC 8.7 11/23/2019   HGB 12.6 11/23/2019   HCT 37.0 11/23/2019   MCV 87.6 11/23/2019   PLT 280 11/23/2019    Recent Labs  Lab 11/23/19 1901 11/23/19 1901 11/23/19 1928  NA 134*   < > 133*  K 3.8   < > 3.7  CL 99   < > 98  CO2 22  --   --   BUN 15   < > 15  CREATININE 0.65   < > 0.60  CALCIUM 9.3  --   --   PROT 6.5  --   --   BILITOT 0.2*  --   --   ALKPHOS 72  --   --   ALT 17  --   --   AST 20  --   --   GLUCOSE 136*   < > 133*   < > = values in this interval not displayed.     Radiology/Studies: CT HEAD WO CONTRAST  Result Date: 11/23/2019 CLINICAL DATA:  Transient ischemic attack. EXAM: CT HEAD WITHOUT CONTRAST TECHNIQUE: Contiguous axial images were obtained from the base of the skull through the vertex without intravenous contrast. COMPARISON:  10/19/2019.  12/20/2017.  12/04/2011 FINDINGS: Brain: Age related volume loss. No focal abnormality affects brainstem or cerebellum. Cerebral hemispheres show chronic small-vessel change of the white matter. Ventricular prominence presumed secondary 2 ex vacuo enlargement. Ventricular size is nearly stable going as far back as 2013. Vascular: There is atherosclerotic calcification of the major vessels at the base of the brain. Skull: Negative Sinuses/Orbits: Clear/normal Other: None IMPRESSION: No acute finding. Chronic atrophy and small-vessel ischemic change affecting the brain.  Ventricular prominence presumed secondary to ex vacuo enlargement, fairly stable since 2013. Electronically Signed   By: Nelson Chimes M.D.   On: 11/23/2019 20:05   MR ANGIO HEAD WO CONTRAST  Result Date: 11/24/2019 CLINICAL DATA:  Aphasia, right hand  numbness EXAM: MRI HEAD WITHOUT CONTRAST MRA HEAD WITHOUT CONTRAST TECHNIQUE: Multiplanar, multiecho pulse sequences of the brain and surrounding structures were obtained without intravenous contrast. Angiographic images of the head were obtained using MRA technique without contrast. COMPARISON:  Brain MRI 2014 FINDINGS: MRI HEAD FINDINGS Brain: There is no acute infarction or intracranial hemorrhage. There is no intracranial mass, mass effect, or edema. There is no hydrocephalus or extra-axial fluid collection. Patchy T2 hyperintensity in the supratentorial white matter is nonspecific but may reflect mild to moderate chronic microvascular ischemic changes similar to prior study. Prominence of the ventricles sulci reflects stable parenchymal volume loss. Vascular: Major vessel flow voids at the skull base are preserved. Skull and upper cervical spine: Normal marrow signal is preserved. Sinuses/Orbits: Minor mucosal thickening.  Orbits are unremarkable. Other: Sella is unremarkable.  Trace mastoid fluid opacification. MRA HEAD FINDINGS Intracranial internal carotid arteries are patent. Middle and anterior cerebral arteries are patent. Intracranial vertebral arteries, basilar artery, posterior cerebral arteries are patent. Bilateral posterior communicating arteries are present. There is no significant stenosis or aneurysm. IMPRESSION: No acute infarction, hemorrhage, or mass. Stable chronic microvascular ischemic changes. No proximal intracranial vessel occlusion or significant stenosis. Electronically Signed   By: Macy Mis M.D.   On: 11/24/2019 08:56   MR BRAIN WO CONTRAST  Result Date: 11/24/2019 CLINICAL DATA:  Aphasia, right hand numbness EXAM: MRI HEAD WITHOUT CONTRAST MRA HEAD WITHOUT CONTRAST TECHNIQUE: Multiplanar, multiecho pulse sequences of the brain and surrounding structures were obtained without intravenous contrast. Angiographic images of the head were obtained using MRA technique without  contrast. COMPARISON:  Brain MRI 2014 FINDINGS: MRI HEAD FINDINGS Brain: There is no acute infarction or intracranial hemorrhage. There is no intracranial mass, mass effect, or edema. There is no hydrocephalus or extra-axial fluid collection. Patchy T2 hyperintensity in the supratentorial white matter is nonspecific but may reflect mild to moderate chronic microvascular ischemic changes similar to prior study. Prominence of the ventricles sulci reflects stable parenchymal volume loss. Vascular: Major vessel flow voids at the skull base are preserved. Skull and upper cervical spine: Normal marrow signal is preserved. Sinuses/Orbits: Minor mucosal thickening.  Orbits are unremarkable. Other: Sella is unremarkable.  Trace mastoid fluid opacification. MRA HEAD FINDINGS Intracranial internal carotid arteries are patent. Middle and anterior cerebral arteries are patent. Intracranial vertebral arteries, basilar artery, posterior cerebral arteries are patent. Bilateral posterior communicating arteries are present. There is no significant stenosis or aneurysm. IMPRESSION: No acute infarction, hemorrhage, or mass. Stable chronic microvascular ischemic changes. No proximal intracranial vessel occlusion or significant stenosis. Electronically Signed   By: Macy Mis M.D.   On: 11/24/2019 08:56    12-lead ECG NSR 85 bpm, PR 162, QRS 80  (personally reviewed) All prior EKG's in EPIC reviewed with no documented atrial fibrillation Monitor for questionable syncope from 11/2011 showed "PACs,PVCs, isolated couplets, and rare junctional escape beats" per Dr. Lonia Skinner Notes.  Telemetry NSR 80s mostly. But yesterday am HR was quite irritable with atrial and ventricular ectopy with rates  in the 120-150s and PVCs/NSVT (personally reviewed)  Assessment and Plan:  1. Cryptogenic stroke The patient presents with cryptogenic stroke.  The patient does not have a TEE planned for this AM.  I spoke at length  with the patient  about monitoring for afib with an implantable loop recorder.  Risks, benefits, and alteratives to implantable loop recorder were discussed with the patient today.   At this time, the patient is very clear in their decision to proceed with implantable loop recorder pending DVT US.   Wound care was reviewed with the patient (keep incision clean and dry for 3 days).  If performed, will schedule wound check scheduled and enter in AVS. Please call with questions.   Shirley Friar, PA-C 11/24/2019 2:12 PM  I have seen, examined the patient, and reviewed the above assessment and plan.  Changes to above are made where necessary.  On exam, RRR.   I agree with Dr Erlinda Hong that ILR is indicated for further evaluation of atrial fibrillation as a possible cause for stroke.  I spoke at length with the patient about monitoring for afib with an implantable loop recorder.  Risks, benefits, and alteratives to implantable loop recorder were discussed with the patient today.   At this time, the patient is very clear in their decision to proceed with implantable loop recorder.   Please call with questions.    Co Sign: Thompson Grayer, MD 11/25/2019 12:58 PM

## 2019-11-24 NOTE — Progress Notes (Signed)
Carotid duplex has been completed.   Preliminary results in CV Proc.   Abram Sander 11/24/2019 3:41 PM

## 2019-11-24 NOTE — Progress Notes (Signed)
SLP Cancellation Note  Patient Details Name: Courtney Shea MRN: [id number] DOB: 09/13/1930   Cancelled treatment:       Reason Eval/Treat Not Completed: SLP screened, no needs identified, will sign off.  Please reconsult if needs arise.  Cheikh Bramble B. Quentin Ore, [facility], [place] Speech Language Pathologist Office: [phone] Pager: [phone]    Shonna Chock 11/24/2019, 10:12 AM

## 2019-11-24 NOTE — Care Management Obs Status (Signed)
[facility] NOTIFICATION   Patient Details  Name: MIESHIA REFFITT MRN: [id number] Date of Birth: 1930/07/14   Medicare Observation Status Notification Given:  Yes    Geralynn Ochs, LCSW 11/24/2019, 3:44 PM

## 2019-11-24 NOTE — Progress Notes (Signed)
Progress Note    Courtney Shea  [id number] DOB: [birth year]  DOA: 11/23/2019 PCP: Josetta Huddle, MD    Brief Narrative:     Medical records reviewed and are as summarized below:  Courtney Shea is an 86 y.o. female with medical history significant of breast cancer, hypertension, hypothyroidism, history of prior TIA, subdural hematoma in November 2020 presenting to the ED for evaluation of aphasia and right arm numbness.  Per EMS report, patient's husband noticed that she was having a hard time getting words out.  Last seen normal at 1530.  Symptoms resolved in 20 minutes.  Patient states around lunchtime she experienced difficulty getting words out when she was trying to speak.  Her right hand felt numb at that time.  No focal weakness.  Symptoms lasted approximately 20 minutes.  States she had a similar episode about 5 years ago and was started on aspirin and Plavix after that event.  These medications were stopped in November 2020 after she had a brain bleed.  States she was previously taking Crestor which was stopped a few years ago because it caused her to have muscle pains.  Denies any fevers, cough, shortness of breath, or recent illness.   Assessment/Plan:   Principal Problem:   TIA (transient ischemic attack) Active Problems:   Hypothyroidism   HTN (hypertension)   UTI (urinary tract infection)   Carcinoma of upper-inner quadrant of left female breast (HCC)   TIA -aphasia and right hand numbness.  Symptoms lasted approximately 20 minutes and currently back to baseline.   -Head CT negative for acute intracranial abnormality.  Patient has a history of prior TIA for which she was on aspirin and Plavix which were stopped in November 2020 after she had a subdural hematoma and subarachnoid hemorrhage after a fall. -Telemetry monitoring -MRI of the brain: no acute infarction -2D echocardiogram -Hemoglobin A1c: 5.5, fasting lipid panel: LDL:101 -PT, OT -Neurology  consult appreciated-- recommendations for loop recorder -Antiplatelet therapy per neurology recommendations -Patient reports history of statin intolerance  UTI UA with positive nitrite, moderate amount of leukocytes, 21-50 WBCs, and many bacteria.  Afebrile and no leukocytosis.  No signs of sepsis. -Continue ceftriaxone through tomm and then d/c -no culture ordered  Hypertension -resume home meds  History of breast cancer Status post lumpectomy and radiation in 2016. -Continue home Arimidex  Hypothyroidism -Continue Synthroid   Family Communication/Anticipated D/C date and plan/Code Status   DVT prophylaxis: Lovenox ordered. Code Status: dnr  Family Communication:  Disposition Plan: home after loop recorder   Medical Consultants:    Neurology    Subjective:   C/o being cold  Objective:    Vitals:   11/24/19 0842 11/24/19 0937 11/24/19 1036 11/24/19 1207  BP: (!) 172/101 (!) 185/95 (!) 184/99 (!) 119/108  Pulse: 67 78  84  Resp: 16 16 19 14   Temp: 98.4 F (36.9 C)   98.2 F (36.8 C)  TempSrc: Oral   Oral  SpO2: 98% 99%  95%  Weight:      Height:        Intake/Output Summary (Last 24 hours) at 11/24/2019 1420 Last data filed at 11/24/2019 0900 Gross per 24 hour  Intake 340 ml  Output 800 ml  Net -460 ml   Filed Weights   11/23/19 1752  Weight: 72.8 kg    Exam: In bed, just back from MRI A+OX3 rrr No increased work of breathing Pleasant and cooperative Moves all 4 ext  Data Reviewed:  I have personally reviewed following labs and imaging studies:  Labs: Labs show the following:   Basic Metabolic Panel: Recent Labs  Lab 11/23/19 1901 11/23/19 1928  NA 134* 133*  K 3.8 3.7  CL 99 98  CO2 22  --   GLUCOSE 136* 133*  BUN 15 15  CREATININE 0.65 0.60  CALCIUM 9.3  --    GFR Estimated Creatinine Clearance: 48.4 mL/min (by C-G formula based on SCr of 0.6 mg/dL). Liver Function Tests: Recent Labs  Lab 11/23/19 1901  AST 20    ALT 17  ALKPHOS 72  BILITOT 0.2*  PROT 6.5  ALBUMIN 3.9   No results for input(s): LIPASE, AMYLASE in the last 168 hours. No results for input(s): AMMONIA in the last 168 hours. Coagulation profile Recent Labs  Lab 11/23/19 1901  INR 1.0    CBC: Recent Labs  Lab 11/23/19 1901 11/23/19 1928  WBC 8.7  --   NEUTROABS 6.6  --   HGB 12.6 12.6  HCT 37.6 37.0  MCV 87.6  --   PLT 280  --    Cardiac Enzymes: No results for input(s): CKTOTAL, CKMB, CKMBINDEX, TROPONINI in the last 168 hours. BNP (last 3 results) No results for input(s): PROBNP in the last 8760 hours. CBG: No results for input(s): GLUCAP in the last 168 hours. D-Dimer: No results for input(s): DDIMER in the last 72 hours. Hgb A1c: Recent Labs    11/24/19 0141  HGBA1C 5.5   Lipid Profile: Recent Labs    11/24/19 0141  CHOL 164  HDL 48  LDLCALC 101*  TRIG 73  CHOLHDL 3.4   Thyroid function studies: No results for input(s): TSH, T4TOTAL, T3FREE, THYROIDAB in the last 72 hours.  Invalid input(s): FREET3 Anemia work up: No results for input(s): VITAMINB12, FOLATE, FERRITIN, TIBC, IRON, RETICCTPCT in the last 72 hours. Sepsis Labs: Recent Labs  Lab 11/23/19 1901  WBC 8.7    Microbiology Recent Results (from the past 240 hour(s))  SARS CORONAVIRUS 2 (TAT 6-24 HRS) Nasopharyngeal Nasopharyngeal Swab     Status: None   Collection Time: 11/23/19  9:12 PM   Specimen: Nasopharyngeal Swab  Result Value Ref Range Status   SARS Coronavirus 2 NEGATIVE NEGATIVE Final    Comment: (NOTE) SARS-CoV-2 target nucleic acids are NOT DETECTED. The SARS-CoV-2 RNA is generally detectable in upper and lower respiratory specimens during the acute phase of infection. Negative results do not preclude SARS-CoV-2 infection, do not rule out co-infections with other pathogens, and should not be used as the sole basis for treatment or other patient management decisions. Negative results must be combined with clinical  observations, patient history, and epidemiological information. The expected result is Negative. Fact Sheet for Patients: SugarRoll.be Fact Sheet for Healthcare Providers: https://www.woods-mathews.com/ This test is not yet approved or cleared by the [place] FDA and  has been authorized for detection and/or diagnosis of SARS-CoV-2 by FDA under an Emergency Use Authorization (EUA). This EUA will remain  in effect (meaning this test can be used) for the duration of the COVID-19 declaration under Section 56 4(b)(1) of the Act, 21 U.S.C. section 360bbb-3(b)(1), unless the authorization is terminated or revoked sooner. Performed at [facility], [place] [address] [place], [place] [address]     Procedures and diagnostic studies:  CT HEAD WO CONTRAST  Result Date: 11/23/2019 CLINICAL DATA:  Transient ischemic attack. EXAM: CT HEAD WITHOUT CONTRAST TECHNIQUE: Contiguous axial images were obtained from the base of the skull through  the vertex without intravenous contrast. COMPARISON:  10/19/2019.  12/20/2017.  12/04/2011 FINDINGS: Brain: Age related volume loss. No focal abnormality affects brainstem or cerebellum. Cerebral hemispheres show chronic small-vessel change of the white matter. Ventricular prominence presumed secondary 2 ex vacuo enlargement. Ventricular size is nearly stable going as far back as 2013. Vascular: There is atherosclerotic calcification of the major vessels at the base of the brain. Skull: Negative Sinuses/Orbits: Clear/normal Other: None IMPRESSION: No acute finding. Chronic atrophy and small-vessel ischemic change affecting the brain. Ventricular prominence presumed secondary to ex vacuo enlargement, fairly stable since 2013. Electronically Signed   By: Nelson Chimes M.D.   On: 11/23/2019 20:05   MR ANGIO HEAD WO CONTRAST  Result Date: 11/24/2019 CLINICAL DATA:  Aphasia, right hand numbness EXAM: MRI HEAD WITHOUT CONTRAST  MRA HEAD WITHOUT CONTRAST TECHNIQUE: Multiplanar, multiecho pulse sequences of the brain and surrounding structures were obtained without intravenous contrast. Angiographic images of the head were obtained using MRA technique without contrast. COMPARISON:  Brain MRI 2014 FINDINGS: MRI HEAD FINDINGS Brain: There is no acute infarction or intracranial hemorrhage. There is no intracranial mass, mass effect, or edema. There is no hydrocephalus or extra-axial fluid collection. Patchy T2 hyperintensity in the supratentorial white matter is nonspecific but may reflect mild to moderate chronic microvascular ischemic changes similar to prior study. Prominence of the ventricles sulci reflects stable parenchymal volume loss. Vascular: Major vessel flow voids at the skull base are preserved. Skull and upper cervical spine: Normal marrow signal is preserved. Sinuses/Orbits: Minor mucosal thickening.  Orbits are unremarkable. Other: Sella is unremarkable.  Trace mastoid fluid opacification. MRA HEAD FINDINGS Intracranial internal carotid arteries are patent. Middle and anterior cerebral arteries are patent. Intracranial vertebral arteries, basilar artery, posterior cerebral arteries are patent. Bilateral posterior communicating arteries are present. There is no significant stenosis or aneurysm. IMPRESSION: No acute infarction, hemorrhage, or mass. Stable chronic microvascular ischemic changes. No proximal intracranial vessel occlusion or significant stenosis. Electronically Signed   By: Macy Mis M.D.   On: 11/24/2019 08:56   MR BRAIN WO CONTRAST  Result Date: 11/24/2019 CLINICAL DATA:  Aphasia, right hand numbness EXAM: MRI HEAD WITHOUT CONTRAST MRA HEAD WITHOUT CONTRAST TECHNIQUE: Multiplanar, multiecho pulse sequences of the brain and surrounding structures were obtained without intravenous contrast. Angiographic images of the head were obtained using MRA technique without contrast. COMPARISON:  Brain MRI 2014  FINDINGS: MRI HEAD FINDINGS Brain: There is no acute infarction or intracranial hemorrhage. There is no intracranial mass, mass effect, or edema. There is no hydrocephalus or extra-axial fluid collection. Patchy T2 hyperintensity in the supratentorial white matter is nonspecific but may reflect mild to moderate chronic microvascular ischemic changes similar to prior study. Prominence of the ventricles sulci reflects stable parenchymal volume loss. Vascular: Major vessel flow voids at the skull base are preserved. Skull and upper cervical spine: Normal marrow signal is preserved. Sinuses/Orbits: Minor mucosal thickening.  Orbits are unremarkable. Other: Sella is unremarkable.  Trace mastoid fluid opacification. MRA HEAD FINDINGS Intracranial internal carotid arteries are patent. Middle and anterior cerebral arteries are patent. Intracranial vertebral arteries, basilar artery, posterior cerebral arteries are patent. Bilateral posterior communicating arteries are present. There is no significant stenosis or aneurysm. IMPRESSION: No acute infarction, hemorrhage, or mass. Stable chronic microvascular ischemic changes. No proximal intracranial vessel occlusion or significant stenosis. Electronically Signed   By: Macy Mis M.D.   On: 11/24/2019 08:56    Medications:   . anastrozole  1 mg Oral QHS  .  aspirin EC  81 mg Oral Daily  . atorvastatin  20 mg Oral q1800  . cloNIDine  0.3 mg Oral QHS  . clopidogrel  75 mg Oral Daily  . enoxaparin (LOVENOX) injection  40 mg Subcutaneous Q24H  . levothyroxine  100 mcg Oral Q0600  . losartan  100 mg Oral Daily  . metoprolol succinate  50 mg Oral Daily  . pantoprazole  40 mg Oral QPM   Continuous Infusions: . cefTRIAXone (ROCEPHIN)  IV       LOS: 0 days   Geradine Girt  [facility]   How to contact the [facility] Attending or Consulting provider [place] or covering provider during after hours [place], for this patient?  1. Check the care team in [facility] and  look for a) attending/consulting [facility] provider listed and b) the [facility] team listed 2. Log into [email or web address] and use [place]'s universal password to access. If you do not have the password, please contact the hospital operator. 3. Locate the [facility] provider you are looking for under [facility] and page to a number that you can be directly reached. 4. If you still have difficulty reaching the provider, please page the [facility] (Director on Call) for the Hospitalists listed on amion for assistance.  11/24/2019, 2:20 PM

## 2019-11-24 NOTE — Progress Notes (Addendum)
Arrived from ED at 0000. Alert and oriented. C/o of headache 6/10 from previous fall. Administered tylenol. Ambulatory to [facility].  Bed in lowest position. Passed [facility]ale swallow screen in ED.

## 2019-11-24 NOTE — Progress Notes (Signed)
STROKE TEAM PROGRESS NOTE   INTERVAL HISTORY Pt lying in bed, no family at bedside. Pt awake alert and orientated. Denies heart palpitation, stated that had similar symptoms in 2014 was told to have TIA and later she had syncope and had 30 day cardiac event monitoring showed no afib.   Vitals:   11/24/19 0842 11/24/19 0937 11/24/19 1036 11/24/19 1207  BP: (!) 172/101 (!) 185/95 (!) 184/99 (!) 119/108  Pulse: 67 78  84  Resp: 16 16 19 14   Temp: 98.4 F (36.9 C)   98.2 F (36.8 C)  TempSrc: Oral   Oral  SpO2: 98% 99%  95%  Weight:      Height:        CBC:  Recent Labs  Lab 11/23/19 1901 11/23/19 1928  WBC 8.7  --   NEUTROABS 6.6  --   HGB 12.6 12.6  HCT 37.6 37.0  MCV 87.6  --   PLT 280  --     Basic Metabolic Panel:  Recent Labs  Lab 11/23/19 1901 11/23/19 1928  NA 134* 133*  K 3.8 3.7  CL 99 98  CO2 22  --   GLUCOSE 136* 133*  BUN 15 15  CREATININE 0.65 0.60  CALCIUM 9.3  --    Lipid Panel:     Component Value Date/Time   CHOL 164 11/24/2019 0141   TRIG 73 11/24/2019 0141   HDL 48 11/24/2019 0141   CHOLHDL 3.4 11/24/2019 0141   VLDL 15 11/24/2019 0141   LDLCALC 101 (H) 11/24/2019 0141   HgbA1c:  Lab Results  Component Value Date   HGBA1C 5.5 11/24/2019   Urine Drug Screen:     Component Value Date/Time   LABOPIA NONE DETECTED 11/23/2019 1901   COCAINSCRNUR NONE DETECTED 11/23/2019 1901   LABBENZ NONE DETECTED 11/23/2019 1901   AMPHETMU NONE DETECTED 11/23/2019 1901   THCU NONE DETECTED 11/23/2019 1901   LABBARB NONE DETECTED 11/23/2019 1901    Alcohol Level     Component Value Date/Time   ETH <10 11/23/2019 1901    IMAGING past 48 hours CT HEAD WO CONTRAST  Result Date: 11/23/2019 CLINICAL DATA:  Transient ischemic attack. EXAM: CT HEAD WITHOUT CONTRAST TECHNIQUE: Contiguous axial images were obtained from the base of the skull through the vertex without intravenous contrast. COMPARISON:  10/19/2019.  12/20/2017.  12/04/2011 FINDINGS:  Brain: Age related volume loss. No focal abnormality affects brainstem or cerebellum. Cerebral hemispheres show chronic small-vessel change of the white matter. Ventricular prominence presumed secondary 2 ex vacuo enlargement. Ventricular size is nearly stable going as far back as 2013. Vascular: There is atherosclerotic calcification of the major vessels at the base of the brain. Skull: Negative Sinuses/Orbits: Clear/normal Other: None IMPRESSION: No acute finding. Chronic atrophy and small-vessel ischemic change affecting the brain. Ventricular prominence presumed secondary to ex vacuo enlargement, fairly stable since 2013. Electronically Signed   By: Nelson Chimes M.D.   On: 11/23/2019 20:05   MR ANGIO HEAD WO CONTRAST  Result Date: 11/24/2019 CLINICAL DATA:  Aphasia, right hand numbness EXAM: MRI HEAD WITHOUT CONTRAST MRA HEAD WITHOUT CONTRAST TECHNIQUE: Multiplanar, multiecho pulse sequences of the brain and surrounding structures were obtained without intravenous contrast. Angiographic images of the head were obtained using MRA technique without contrast. COMPARISON:  Brain MRI 2014 FINDINGS: MRI HEAD FINDINGS Brain: There is no acute infarction or intracranial hemorrhage. There is no intracranial mass, mass effect, or edema. There is no hydrocephalus or extra-axial fluid collection. Patchy T2 hyperintensity  in the supratentorial white matter is nonspecific but may reflect mild to moderate chronic microvascular ischemic changes similar to prior study. Prominence of the ventricles sulci reflects stable parenchymal volume loss. Vascular: Major vessel flow voids at the skull base are preserved. Skull and upper cervical spine: Normal marrow signal is preserved. Sinuses/Orbits: Minor mucosal thickening.  Orbits are unremarkable. Other: Sella is unremarkable.  Trace mastoid fluid opacification. MRA HEAD FINDINGS Intracranial internal carotid arteries are patent. Middle and anterior cerebral arteries are patent.  Intracranial vertebral arteries, basilar artery, posterior cerebral arteries are patent. Bilateral posterior communicating arteries are present. There is no significant stenosis or aneurysm. IMPRESSION: No acute infarction, hemorrhage, or mass. Stable chronic microvascular ischemic changes. No proximal intracranial vessel occlusion or significant stenosis. Electronically Signed   By: Macy Mis M.D.   On: 11/24/2019 08:56   MR BRAIN WO CONTRAST  Result Date: 11/24/2019 CLINICAL DATA:  Aphasia, right hand numbness EXAM: MRI HEAD WITHOUT CONTRAST MRA HEAD WITHOUT CONTRAST TECHNIQUE: Multiplanar, multiecho pulse sequences of the brain and surrounding structures were obtained without intravenous contrast. Angiographic images of the head were obtained using MRA technique without contrast. COMPARISON:  Brain MRI 2014 FINDINGS: MRI HEAD FINDINGS Brain: There is no acute infarction or intracranial hemorrhage. There is no intracranial mass, mass effect, or edema. There is no hydrocephalus or extra-axial fluid collection. Patchy T2 hyperintensity in the supratentorial white matter is nonspecific but may reflect mild to moderate chronic microvascular ischemic changes similar to prior study. Prominence of the ventricles sulci reflects stable parenchymal volume loss. Vascular: Major vessel flow voids at the skull base are preserved. Skull and upper cervical spine: Normal marrow signal is preserved. Sinuses/Orbits: Minor mucosal thickening.  Orbits are unremarkable. Other: Sella is unremarkable.  Trace mastoid fluid opacification. MRA HEAD FINDINGS Intracranial internal carotid arteries are patent. Middle and anterior cerebral arteries are patent. Intracranial vertebral arteries, basilar artery, posterior cerebral arteries are patent. Bilateral posterior communicating arteries are present. There is no significant stenosis or aneurysm. IMPRESSION: No acute infarction, hemorrhage, or mass. Stable chronic microvascular  ischemic changes. No proximal intracranial vessel occlusion or significant stenosis. Electronically Signed   By: Macy Mis M.D.   On: 11/24/2019 08:56    PHYSICAL EXAM  Temp:  [97.8 F (36.6 C)-98.4 F (36.9 C)] 98.4 F (36.9 C) (01/28 1634) Pulse Rate:  [67-85] 85 (01/28 1634) Resp:  [14-19] 15 (01/28 1634) BP: (119-185)/(73-108) 163/104 (01/28 1634) SpO2:  [95 %-100 %] 97 % (01/28 1634)  General - Well nourished, well developed, in no apparent distress.  Ophthalmologic - fundi not visualized due to noncooperation.  Cardiovascular - Regular rhythm and rate.  Mental Status -  Level of arousal and orientation to time, place, and person were intact. Language including expression, naming, repetition, comprehension was assessed and found intact. Fund of Knowledge was assessed and was intact.  Cranial Nerves II - XII - II - Visual field intact OU. III, IV, VI - Extraocular movements intact. V - Facial sensation intact bilaterally. VII - Facial movement intact bilaterally. VIII - Hearing & vestibular intact bilaterally. X - Palate elevates symmetrically. XI - Chin turning & shoulder shrug intact bilaterally. XII - Tongue protrusion intact.  Motor Strength - The patient's strength was normal in all extremities and pronator drift was absent.  Bulk was normal and fasciculations were absent.   Motor Tone - Muscle tone was assessed at the neck and appendages and was normal.  Reflexes - The patient's reflexes were symmetrical in all extremities  and she had no pathological reflexes.  Sensory - Light touch, temperature/pinprick were assessed and were symmetrical.    Coordination - The patient had normal movements in the hands with no ataxia or dysmetria.  Tremor was absent.  Gait and Station - deferred.   ASSESSMENT/PLAN Ms. Courtney Shea is a 86 y.o. female with history of breast cancer, HTN, TIA previously on aspirin and plavix, SDH in Nov 2020 w/o antiplatelets restarted  presenting with transient expressive aphasia and R sided numbness accompanied by HA.   L brain embolic TIA  CT head No acute abnormality. Small vessel disease. Atrophy. Ventricular prominence stable since 2013  MRI  No acute infarct   MRA Unremarkable   Carotid Doppler  unremarkable  2D Echo EF 55-60%  LE venous doppler pending  EP cardiology consulted for Loop recorder placement to look for atrial fibrillation as possible source of embolic TIA (hx syncope in 2013 and evaluated by [facility] who recommended loop placement if recurrence)  LDL 101  HgbA1c 5.5  Lovenox 40 mg sq daily for VTE prophylaxis  No antithrombotic prior to admission, previously on aspirin and plavix stopped in Nov following SDH from fall never resumed, now on aspirin 81 mg daily and clopidogrel 75 mg daily. Continue DAPT x 3 weeks then aspirin alone.    Therapy recommendations:  [facility] PT and OT  Disposition:  Return home  Hx of TIA  2014 - TIA. Presented w/  Right followed by L sided weakness. Workup negaive. Dr. Inda Merlin added plavix to aspirin at that time.  Hypertension  Stable . BP goal normotensive  Hyperlipidemia  Home meds:  No statin  Now on lipitor 20  Will not use intensive statin given advanced age   LDL 101, goal < 70  Continue statin at discharge  Other Stroke Risk Factors  Advanced age  Other Active Problems  Hx R breast cancer s/p partial mastectomy and XRT 2016. On Arimidex  UTI on rocephin  Hypothyroidism on synthroid  Hospital day # 0  Rosalin Hawking, MD PhD Stroke Neurology 11/24/2019 8:44 PM   To contact Stroke Continuity provider, please refer to [email or web address] After hours, contact General Neurology

## 2019-11-24 NOTE — Consult Note (Addendum)
ELECTROPHYSIOLOGY CONSULT NOTE  Patient ID: Courtney Shea MRN: [id number], DOB/AGE: [birth year]   Admit date: 11/23/2019 Date of Consult: 11/25/19   Primary Physician: Josetta Huddle, MD Primary Cardiologist: No primary care provider on file.  Primary Electrophysiologist: New to Dr. Rayann Heman Reason for Consultation: Cryptogenic stroke; recommendations regarding Implantable Loop Recorder Insurance: [facility] Medicare  History of Present Illness EP has been asked to evaluate Courtney Shea for placement of an implantable loop recorder to monitor for atrial fibrillation by Dr Erlinda Hong.  The patient was admitted on 11/23/2019 with 11/23/2019.  They first developed symptoms while at home having lunch. She had word finding difficulty and dysphasia with R hand numbness for ~ 20 minutes. Previously started on ASA/Plavix about 5 years ago for similar. (These were stopped in 08/2019 due to brain bleed after a mechanical fall. Imaging this admission demonstrated no acute infarction, hemorrhage, or mass. Thought to be TIA. They have undergone workup for stroke including echocardiogram and carotid dopplers that showed B 1-39% stenosis.  The patient has been monitored on telemetry which has demonstrated sinus rhythm and SVT/PVCs with NSVT.  Inpatient stroke work-up will not require a TEE per Neurology.   Echocardiogram this admission demonstrated 55-60%.  Lab work is reviewed.  She is presently undergoing DVT US.   Prior to admission, the patient denies chest pain, shortness of breath, dizziness, palpitations, or syncope.  They are recovering from their stroke with plans to return home  at discharge.  Past Medical History:  Diagnosis Date  . Breast cancer ([place])   . Breast cancer of lower-outer quadrant of right female breast ([place]) 05/07/2015  . GERD (gastroesophageal reflux disease)   . Hypertension   . Hypothyroid   . Personal history of radiation therapy   . Stroke [employer])    TIA, no deficits, placed on  Plavix  . Syncope   . Ventricular bigeminy      Surgical History:  Past Surgical History:  Procedure Laterality Date  . ABDOMINAL HYSTERECTOMY    . BLADDER SUSPENSION    . BREAST LUMPECTOMY Right    2016  . CATARACT EXTRACTION    . INCONTINENCE SURGERY    . Ovarian cyst removed    . PARTIAL MASTECTOMY WITH NEEDLE LOCALIZATION Bilateral 05/29/2015   Procedure: LEFT BREAST PARTIAL MASTECTOMY WITH NEEDLE LOCALIZATION TIMES 2 AND RIGHT BREAST PARTIAL MASTECTOMY WITH NEEDLE LOCALIZATION;  Surgeon: Erroll Luna, MD;  Location: [facility];  Service: General;  Laterality: Bilateral;  . RE-EXCISION OF BREAST LUMPECTOMY Left 06/21/2015   Procedure: LEFT BREAST RE EXCISION OF LUMPECTOMY;  Surgeon: Erroll Luna, MD;  Location: [facility];  Service: General;  Laterality: Left;     Medications Prior to Admission  Medication Sig Dispense Refill Last Dose  . acetaminophen (TYLENOL) 325 MG tablet Take 2 tablets (650 mg total) by mouth every 6 (six) hours as needed for mild pain (or Fever >/= 101).   unk at [employer]  . anastrozole (ARIMIDEX) 1 MG tablet Take 1 tablet (1 mg total) by mouth daily. (Patient taking differently: Take 1 mg by mouth at bedtime. ) 90 tablet 3 11/22/2019 at pm  . Calcium Carb-Cholecalciferol (CALCIUM+D3 PO) Take 1 tablet by mouth daily with lunch.   11/23/2019 at 1200  . cloNIDine (CATAPRES) 0.3 MG tablet Take 0.3 mg by mouth at bedtime.   11/22/2019 at pm  . Cranberry 500 MG CAPS Take 500 mg by mouth daily.   11/23/2019 at Unknown time  . levothyroxine (  SYNTHROID) 100 MCG tablet Take 100 mcg by mouth daily.    11/23/2019 at am  . losartan (COZAAR) 100 MG tablet Take 100 mg by mouth daily.   11/23/2019 at am  . metoprolol succinate (TOPROL-XL) 50 MG 24 hr tablet Take 50 mg by mouth daily. Take with or immediately following a meal.   11/23/2019 at 0700  . pantoprazole (PROTONIX) 40 MG tablet Take 40 mg by mouth every evening.   11/22/2019 at [pm  .  amoxicillin-clavulanate (AUGMENTIN) 500-125 MG tablet Take 1 tablet (500 mg total) by mouth 2 (two) times daily. (Patient not taking: Reported on 11/23/2019) 14 tablet 0 Not Taking at Unknown time  . furosemide (LASIX) 20 MG tablet Take 1 tablet (20 mg total) by mouth daily as needed for edema. (Patient not taking: Reported on 11/23/2019) 30 tablet  Not Taking at Unknown time  . levETIRAcetam (KEPPRA) 500 MG tablet Take 1 tablet (500 mg total) by mouth 2 (two) times daily. (Patient not taking: Reported on 11/23/2019) 14 tablet 0 Not Taking at Unknown time    Inpatient Medications:  . anastrozole  1 mg Oral QHS  . aspirin EC  81 mg Oral Daily  . atorvastatin  20 mg Oral q1800  . cloNIDine  0.3 mg Oral QHS  . clopidogrel  75 mg Oral Daily  . enoxaparin (LOVENOX) injection  40 mg Subcutaneous Q24H  . levothyroxine  100 mcg Oral Q0600  . losartan  100 mg Oral Daily  . metoprolol succinate  50 mg Oral Daily  . pantoprazole  40 mg Oral QPM    Allergies:  Allergies  Allergen Reactions  . Codeine Nausea And Vomiting  . Macrodantin [Nitrofurantoin Macrocrystal] Nausea Only    "extreme nausea"  . Crestor [Rosuvastatin Calcium] Other (See Comments)    "aching in legs"  . Doxycycline Itching and Rash  . Hydrochlorothiazide Rash    "all over body rash"  . Norvasc [Amlodipine Besylate] Swelling    "swelling in ankles"  . Sulfa Antibiotics Nausea And Vomiting    Social History   Socioeconomic History  . Marital status: Married    Spouse name: Not on file  . Number of children: Not on file  . Years of education: Not on file  . Highest education level: Not on file  Occupational History  . Not on file  Tobacco Use  . Smoking status: Never Smoker  . Smokeless tobacco: Never Used  Substance and Sexual Activity  . Alcohol use: No  . Drug use: No  . Sexual activity: Never    Birth control/protection: Post-menopausal  Other Topics Concern  . Not on file  Social History Narrative  . Not  on file   Social Determinants of Health   Financial Resource Strain:   . Difficulty of Paying Living Expenses: Not on file  Food Insecurity:   . Worried About Charity fundraiser in the Last Year: Not on file  . Ran Out of Food in the Last Year: Not on file  Transportation Needs:   . Lack of Transportation (Medical): Not on file  . Lack of Transportation (Non-Medical): Not on file  Physical Activity:   . Days of Exercise per Week: Not on file  . Minutes of Exercise per Session: Not on file  Stress:   . Feeling of Stress : Not on file  Social Connections:   . Frequency of Communication with Friends and Family: Not on file  . Frequency of Social Gatherings with Friends and Family:  Not on file  . Attends Religious Services: Not on file  . Active Member of Clubs or Organizations: Not on file  . Attends Archivist Meetings: Not on file  . Marital Status: Not on file  Intimate Partner Violence:   . Fear of Current or Ex-Partner: Not on file  . Emotionally Abused: Not on file  . Physically Abused: Not on file  . Sexually Abused: Not on file     Family History  Problem Relation Age of Onset  . Coronary artery disease Mother        MI at age 20  . Breast cancer Sister 86  . Kidney disease Brother   . Cancer Sister        colon cancer suggested      Review of Systems: All other systems reviewed and are otherwise negative except as noted above.  Physical Exam: Vitals:   11/24/19 0842 11/24/19 0937 11/24/19 1036 11/24/19 1207  BP: (!) 172/101 (!) 185/95 (!) 184/99 (!) 119/108  Pulse: 67 78  84  Resp: 16 16 19 14   Temp: 98.4 F (36.9 C)   98.2 F (36.8 C)  TempSrc: Oral   Oral  SpO2: 98% 99%  95%  Weight:      Height:        GEN- The patient is well appearing, alert and oriented x 3 today.   Head- normocephalic, atraumatic Eyes-  Sclera clear, conjunctiva pink Ears- hearing intact Oropharynx- clear Neck- supple Lungs- Clear to ausculation bilaterally,  normal work of breathing Heart- Regular rate and rhythm, no murmurs, rubs or gallops  GI- soft, NT, ND, + BS Extremities- no clubbing, cyanosis, or edema MS- no significant deformity or atrophy Skin- no rash or lesion Psych- euthymic mood, full affect   Labs:   Lab Results  Component Value Date   WBC 8.7 11/23/2019   HGB 12.6 11/23/2019   HCT 37.0 11/23/2019   MCV 87.6 11/23/2019   PLT 280 11/23/2019    Recent Labs  Lab 11/23/19 1901 11/23/19 1901 11/23/19 1928  NA 134*   < > 133*  K 3.8   < > 3.7  CL 99   < > 98  CO2 22  --   --   BUN 15   < > 15  CREATININE 0.65   < > 0.60  CALCIUM 9.3  --   --   PROT 6.5  --   --   BILITOT 0.2*  --   --   ALKPHOS 72  --   --   ALT 17  --   --   AST 20  --   --   GLUCOSE 136*   < > 133*   < > = values in this interval not displayed.     Radiology/Studies: CT HEAD WO CONTRAST  Result Date: 11/23/2019 CLINICAL DATA:  Transient ischemic attack. EXAM: CT HEAD WITHOUT CONTRAST TECHNIQUE: Contiguous axial images were obtained from the base of the skull through the vertex without intravenous contrast. COMPARISON:  10/19/2019.  12/20/2017.  12/04/2011 FINDINGS: Brain: Age related volume loss. No focal abnormality affects brainstem or cerebellum. Cerebral hemispheres show chronic small-vessel change of the white matter. Ventricular prominence presumed secondary 2 ex vacuo enlargement. Ventricular size is nearly stable going as far back as 2013. Vascular: There is atherosclerotic calcification of the major vessels at the base of the brain. Skull: Negative Sinuses/Orbits: Clear/normal Other: None IMPRESSION: No acute finding. Chronic atrophy and small-vessel ischemic change affecting the brain.  Ventricular prominence presumed secondary to ex vacuo enlargement, fairly stable since 2013. Electronically Signed   By: Nelson Chimes M.D.   On: 11/23/2019 20:05   MR ANGIO HEAD WO CONTRAST  Result Date: 11/24/2019 CLINICAL DATA:  Aphasia, right hand  numbness EXAM: MRI HEAD WITHOUT CONTRAST MRA HEAD WITHOUT CONTRAST TECHNIQUE: Multiplanar, multiecho pulse sequences of the brain and surrounding structures were obtained without intravenous contrast. Angiographic images of the head were obtained using MRA technique without contrast. COMPARISON:  Brain MRI 2014 FINDINGS: MRI HEAD FINDINGS Brain: There is no acute infarction or intracranial hemorrhage. There is no intracranial mass, mass effect, or edema. There is no hydrocephalus or extra-axial fluid collection. Patchy T2 hyperintensity in the supratentorial white matter is nonspecific but may reflect mild to moderate chronic microvascular ischemic changes similar to prior study. Prominence of the ventricles sulci reflects stable parenchymal volume loss. Vascular: Major vessel flow voids at the skull base are preserved. Skull and upper cervical spine: Normal marrow signal is preserved. Sinuses/Orbits: Minor mucosal thickening.  Orbits are unremarkable. Other: Sella is unremarkable.  Trace mastoid fluid opacification. MRA HEAD FINDINGS Intracranial internal carotid arteries are patent. Middle and anterior cerebral arteries are patent. Intracranial vertebral arteries, basilar artery, posterior cerebral arteries are patent. Bilateral posterior communicating arteries are present. There is no significant stenosis or aneurysm. IMPRESSION: No acute infarction, hemorrhage, or mass. Stable chronic microvascular ischemic changes. No proximal intracranial vessel occlusion or significant stenosis. Electronically Signed   By: Macy Mis M.D.   On: 11/24/2019 08:56   MR BRAIN WO CONTRAST  Result Date: 11/24/2019 CLINICAL DATA:  Aphasia, right hand numbness EXAM: MRI HEAD WITHOUT CONTRAST MRA HEAD WITHOUT CONTRAST TECHNIQUE: Multiplanar, multiecho pulse sequences of the brain and surrounding structures were obtained without intravenous contrast. Angiographic images of the head were obtained using MRA technique without  contrast. COMPARISON:  Brain MRI 2014 FINDINGS: MRI HEAD FINDINGS Brain: There is no acute infarction or intracranial hemorrhage. There is no intracranial mass, mass effect, or edema. There is no hydrocephalus or extra-axial fluid collection. Patchy T2 hyperintensity in the supratentorial white matter is nonspecific but may reflect mild to moderate chronic microvascular ischemic changes similar to prior study. Prominence of the ventricles sulci reflects stable parenchymal volume loss. Vascular: Major vessel flow voids at the skull base are preserved. Skull and upper cervical spine: Normal marrow signal is preserved. Sinuses/Orbits: Minor mucosal thickening.  Orbits are unremarkable. Other: Sella is unremarkable.  Trace mastoid fluid opacification. MRA HEAD FINDINGS Intracranial internal carotid arteries are patent. Middle and anterior cerebral arteries are patent. Intracranial vertebral arteries, basilar artery, posterior cerebral arteries are patent. Bilateral posterior communicating arteries are present. There is no significant stenosis or aneurysm. IMPRESSION: No acute infarction, hemorrhage, or mass. Stable chronic microvascular ischemic changes. No proximal intracranial vessel occlusion or significant stenosis. Electronically Signed   By: Macy Mis M.D.   On: 11/24/2019 08:56    12-lead ECG NSR 85 bpm, PR 162, QRS 80  (personally reviewed) All prior EKG's in EPIC reviewed with no documented atrial fibrillation Monitor for questionable syncope from 11/2011 showed "PACs,PVCs, isolated couplets, and rare junctional escape beats" per Dr. Lonia Skinner Notes.  Telemetry NSR 80s mostly. But yesterday am HR was quite irritable with atrial and ventricular ectopy with rates  in the 120-150s and PVCs/NSVT (personally reviewed)  Assessment and Plan:  1. Cryptogenic stroke The patient presents with cryptogenic stroke.  The patient does not have a TEE planned for this AM.  I spoke at length  with the patient  about monitoring for afib with an implantable loop recorder.  Risks, benefits, and alteratives to implantable loop recorder were discussed with the patient today.   At this time, the patient is very clear in their decision to proceed with implantable loop recorder pending DVT US.   Wound care was reviewed with the patient (keep incision clean and dry for 3 days).  If performed, will schedule wound check scheduled and enter in AVS. Please call with questions.   Shirley Friar, PA-C 11/24/2019 2:12 PM  I have seen, examined the patient, and reviewed the above assessment and plan.  Changes to above are made where necessary.  On exam, RRR.   I agree with Dr Erlinda Hong that ILR is indicated for further evaluation of atrial fibrillation as a possible cause for stroke.  I spoke at length with the patient about monitoring for afib with an implantable loop recorder.  Risks, benefits, and alteratives to implantable loop recorder were discussed with the patient today.   At this time, the patient is very clear in their decision to proceed with implantable loop recorder.   Please call with questions.    Co Sign: Thompson Grayer, MD 11/25/2019 12:58 PM

## 2019-11-24 NOTE — Evaluation (Signed)
Occupational Therapy Evaluation Patient Details Name: Courtney Shea MRN: [id number] DOB: 1930-01-01 Today's Date: 11/24/2019    History of Present Illness Pt admitted with  a 20 minute episode of aphasia and R arm numbness.  MRI-. Pt had TIA 5 years ago.  Pt with h/o HTN and breast CA.  Pt feel in 12/2018 and broke L humerous.  Pt fell again in 08/2019 with resulting subdural hematoma.  Pt feels she has had a steady decline since November with her balance and independence.     Clinical Impression   Pt admitted with the above diagnosis and has the deficits listed below. Pt would benefit from cont OT to increase confidence and independence with basic adls so pt can d/c home with her husband safely.  Pt feels she has had a steady decline since her fall in November causing her to be unsteady on her feet.  Feel with some rehab she could increase her safety with adls and adl transfers.  Pt would like to d/c home and not to a rehab center.  Will rec HHOT at this time assuming she can increase independence mobilizing during adls while here in the hospital.  Pt's BP when up in chair after therapy was 185/95.  Rec 24/7 assist at home if she d/c's home.    Follow Up Recommendations  Home health OT;Supervision/Assistance - 24 hour    Equipment Recommendations  3 in 1 bedside commode    Recommendations for Other Services       Precautions / Restrictions Precautions Precautions: Fall Precaution Comments: Had falls in March and November of last year. Restrictions Weight Bearing Restrictions: No      Mobility Bed Mobility Overal bed mobility: Needs Assistance Bed Mobility: Supine to Sit     Supine to sit: Min assist     General bed mobility comments: Pt very weak and requesting assist to get to full sitting.  Transfers Overall transfer level: Needs assistance   Transfers: Sit to/from Stand;Stand Pivot Transfers Sit to Stand: Min assist Stand pivot transfers: Mod assist        General transfer comment: Pt very anxious on feet and shuffled feet during transfers.  Feel many falls have affected pts confidence with mobility.    Balance Overall balance assessment: Needs assistance Sitting-balance support: Feet supported Sitting balance-Leahy Scale: Fair Sitting balance - Comments: Pt with slight L lean Postural control: Left lateral lean Standing balance support: Bilateral upper extremity supported;During functional activity Standing balance-Leahy Scale: Poor Standing balance comment: Relied heavily on outside support.                           ADL either performed or assessed with clinical judgement   ADL Overall ADL's : Needs assistance/impaired Eating/Feeding: Sitting;Set up   Grooming: Set up;Sitting Grooming Details (indicate cue type and reason): Pt was not comfortable standing at sink. Upper Body Bathing: Set up;Sitting   Lower Body Bathing: Moderate assistance;Sit to/from stand;Cueing for compensatory techniques Lower Body Bathing Details (indicate cue type and reason): Pt very nervous to be on her feet during adls therefore requring a lot of assist. Upper Body Dressing : Set up;Sitting   Lower Body Dressing: Moderate assistance;Sit to/from stand;Cueing for compensatory techniques Lower Body Dressing Details (indicate cue type and reason): Pt fatigues quickly during adls and not wanting to attempt a lot this session. Toilet Transfer: Minimal Production assistant, radio Details (indicate cue type and reason): Cues to pick up her feet and  pivot.  Pt very fearful of mobility. Toileting- Clothing Manipulation and Hygiene: Minimal assistance;Sit to/from stand;Cueing for compensatory techniques Toileting - Clothing Manipulation Details (indicate cue type and reason): min assist to steady     Functional mobility during ADLs: Minimal assistance;Cueing for safety;Rolling walker General ADL Comments: Pt appears to have the strength  to do her adls at a baseline level but is very anxious about      Vision Baseline Vision/History: Wears glasses Wears Glasses: At all times Patient Visual Report: No change from baseline Vision Assessment?: No apparent visual deficits     Perception     Praxis Praxis Praxis tested?: Within functional limits    Pertinent Vitals/Pain Pain Assessment: No/denies pain     Hand Dominance Right   Extremity/Trunk Assessment Upper Extremity Assessment Upper Extremity Assessment: Overall WFL for tasks assessed   Lower Extremity Assessment Lower Extremity Assessment: Defer to PT evaluation   Cervical / Trunk Assessment Cervical / Trunk Assessment: Normal   Communication Communication Communication: No difficulties   Cognition Arousal/Alertness: Awake/alert Behavior During Therapy: WFL for tasks assessed/performed Overall Cognitive Status: Within Functional Limits for tasks assessed                                 General Comments: Pt states she is not thinking clearly but completed all screens with no deficits.    General Comments  Pt very debilitated and appears to have had an overall decline in function since November.  Pt requires some assist with adls and mobility but feel pt would be best served doing [facility] therapy if her husband can handle caring for her.  Pt is not at baseline and will need more assist than what she has been getting.      Exercises     Shoulder Instructions      Home Living Family/patient expects to be discharged to:: Private residence Living Arrangements: Spouse/significant other Available Help at Discharge: Family;Available 24 hours/day Type of Home: House Home Access: Stairs to enter [employer] of Steps: 1 Entrance Stairs-Rails: None Home Layout: One level     Bathroom Shower/Tub: Teacher, early years/pre: Standard     Home Equipment: Environmental consultant - 2 wheels;Cane - single point   Additional Comments: Pt has had  HHOT and PT since her fall in November.  Pt currently does not take showers and has not since Nov.  Not a goal for as she gets very cold.      Prior Functioning/Environment Level of Independence: Needs assistance  Gait / Transfers Assistance Needed: Uses walker at all times in home. ADL's / Homemaking Assistance Needed: supervision with most adls with use of walker. Pt sponge bathes.  Husband does all driving and a lot of IADLs since November.            OT Problem List: Decreased activity tolerance;Impaired balance (sitting and/or standing);Decreased knowledge of use of DME or AE      OT Treatment/Interventions: Self-care/ADL training;Therapeutic activities;Balance training    OT Goals(Current goals can be found in the care plan section) Acute Rehab OT Goals Patient Stated Goal: to be able to return home. OT Goal Formulation: With patient Time For Goal Achievement: 12/08/19 Potential to Achieve Goals: Good ADL Goals Pt Will Perform Grooming: with supervision;standing Pt Will Perform Lower Body Bathing: with min guard assist;sit to/from stand Pt Will Perform Lower Body Dressing: with min guard assist;sit to/from stand Pt Will Transfer  to Toilet: with min guard assist;ambulating  OT Frequency: Min 2X/week   Barriers to D/C: Decreased caregiver support  only has husband to assist.  May benefit from a [facility] aid.       Co-evaluation              AM-PAC OT "6 Clicks" Daily Activity     Outcome Measure Help from another person eating meals?: None Help from another person taking care of personal grooming?: None Help from another person toileting, which includes using toliet, bedpan, or urinal?: A Little Help from another person bathing (including washing, rinsing, drying)?: A Little Help from another person to put on and taking off regular upper body clothing?: A Little Help from another person to put on and taking off regular lower body clothing?: A Lot 6 Click Score: 19    End of Session Equipment Utilized During Treatment: Rolling walker Nurse Communication: Mobility status  Activity Tolerance: Patient limited by fatigue Patient left: in chair;with call bell/phone within reach;with chair alarm set  OT Visit Diagnosis: Unsteadiness on feet (R26.81)                Time: [id number] OT Time Calculation (min): 30 min Charges:  OT General Charges $OT Visit: 1 Visit OT Evaluation $OT Eval Moderate Complexity: 1 Mod OT Treatments $Self Care/Home Management : 8-22 mins  Glenford Peers 11/24/2019, 10:25 AM

## 2019-11-24 NOTE — TOC Initial Note (Signed)
Transition of Care [facility]) - Initial/Assessment Note    Patient Details  Name: Courtney Shea MRN: [id number] Date of Birth: September 16, 1930  Transition of Care [facility]) CM/SW Contact:    Pollie Friar, RN Phone Number: 11/24/2019, 12:29 PM  Clinical Narrative:                 Recommendations are for [facility] services. Pt states she is already active with HH through [facility]. CM reached out to [facility] and she was receiving PT/OT at home. Will need resumption orders at d/c.  Spouse able to provide needed supervision and transportation.  Pt denies issues with medications at home.   Expected Discharge Plan: [place] Barriers to Discharge: Continued Medical Work up   Patient Goals and CMS Choice   CMS Medicare.gov Compare Post Acute Care list provided to:: Patient Choice offered to / list presented to : Patient  Expected Discharge Plan and Services Expected Discharge Plan: [place]   Discharge Planning Services: CM Consult Post Acute Care Choice: [place] arrangements for the past 2 months: Single Family Home                                      Prior Living Arrangements/Services Living arrangements for the past 2 months: Single Family Home Lives with:: Spouse Patient language and need for interpreter reviewed:: Yes Do you feel safe going back to the place where you live?: Yes      Need for Family Participation in Patient Care: Yes (Comment) Care giver support system in place?: Yes (comment)(spouse) Current home services: DME(pt states she has all needed DME) Criminal Activity/Legal Involvement Pertinent to Current Situation/Hospitalization: No - Comment as needed  Activities of Daily Living Home Assistive Devices/Equipment: Gilford Rile (specify type) ADL Screening (condition at time of admission) Patient's cognitive ability adequate to safely complete daily activities?: Yes Is the patient deaf or have difficulty hearing?:  No Does the patient have difficulty seeing, even when wearing glasses/contacts?: No Does the patient have difficulty concentrating, remembering, or making decisions?: No Patient able to express need for assistance with ADLs?: Yes Does the patient have difficulty dressing or bathing?: No Independently performs ADLs?: Yes (appropriate for developmental age) Does the patient have difficulty walking or climbing stairs?: No Weakness of Legs: Both Weakness of Arms/Hands: None  Permission Sought/Granted                  Emotional Assessment Appearance:: Appears stated age Attitude/Demeanor/Rapport: Engaged Affect (typically observed): Accepting Orientation: : Oriented to Self, Oriented to Place, Oriented to  Time, Oriented to Situation   Psych Involvement: No (comment)  Admission diagnosis:  Aphasia [R47.01] TIA (transient ischemic attack) [G45.9] Acute cystitis without hematuria [N30.00] Patient Active Problem List   Diagnosis Date Noted  . Subdural hematoma ([place]) 09/21/2019  . SDH (subdural hematoma) ([place]) 09/21/2019  . Closed fracture of left orbit ([place])   . Malignant neoplasm of upper-inner quadrant of left breast in female, estrogen receptor positive ([place]) 07/14/2017  . Osteopenia 11/07/2015  . Carcinoma of upper-inner quadrant of left female breast ([place]) 07/03/2015  . TIA (transient ischemic attack) 06/20/2013  . Hypokalemia 06/20/2013  . Ventricular bigeminy 12/05/2011  . Leukocytosis 12/05/2011  . Syncope 12/04/2011  . Hypothyroidism 12/04/2011  . HTN (hypertension) 12/04/2011  . UTI (urinary tract infection) 12/04/2011  . PAIN IN LIMB 04/02/2007   PCP:  Inda Merlin,  Herbie Baltimore, MD Pharmacy:   [id number] - [place], [address] [place] [address] Phone: [phone] Fax: [phone]  [facility] - [place], [place] - [address] [place] [address] Phone:  [phone] Fax: [phone]     Social Determinants of Health (SDOH) Interventions    Readmission Risk Interventions No flowsheet data found.

## 2019-11-24 NOTE — Progress Notes (Signed)
Physical Therapy Treatment Patient Details Name: Courtney Shea MRN: [id number] DOB: Dec 11, 1929 Today's Date: 11/24/2019    History of Present Illness Pt admitted with  a 20 minute episode of aphasia and R arm numbness.  MRI-. Pt had TIA 5 years ago.  Pt with h/o HTN and breast CA.  Pt feel in 12/2018 and broke L humerous.  Pt fell again in 08/2019 with resulting subdural hematoma.  Pt feels she has had a steady decline since November with her balance and independence.      PT Comments    Pt seen for additional session to progress mobility as limited by tachycardic episode this morning. Pt ambulating 30 feet with a walker at a min guard assist level, HR peak 125 bpm, BP 163/104. Pt likely close to baseline. Continues to presents with generalized weakness and decreased endurance and would benefit from follow up HHPT to address deficits.     Follow Up Recommendations  Home health PT;Supervision/Assistance - 24 hour     Equipment Recommendations  None recommended by PT    Recommendations for Other Services       Precautions / Restrictions Precautions Precautions: Fall;Other (comment) Precaution Comments: Watch HR Restrictions Weight Bearing Restrictions: No    Mobility  Bed Mobility Overal bed mobility: Needs Assistance Bed Mobility: Supine to Sit;Sit to Supine     Supine to sit: Min assist Sit to supine: Min assist   General bed mobility comments: MinA to pull up to sitting and to help legs back into bed upon return.   Transfers Overall transfer level: Needs assistance Equipment used: Rolling walker (2 wheeled) Transfers: Sit to/from Stand Sit to Stand: Min assist         General transfer comment: Light minA to stand from edge of bed  Ambulation/Gait Ambulation/Gait assistance: Min guard Gait Distance (Feet): 30 Feet Assistive device: Rolling walker (2 wheeled) Gait Pattern/deviations: Step-through pattern;Decreased stride length;Trunk flexed;Shuffle Gait  velocity: decreased Gait velocity interpretation: <1.8 ft/sec, indicate of risk for recurrent falls General Gait Details: Pt with slow, shuffling gait speed. Tendency for forward head posture and downward gaze.   Stairs             Wheelchair Mobility    Modified Rankin (Stroke Patients Only)       Balance Overall balance assessment: Needs assistance Sitting-balance support: Feet supported Sitting balance-Leahy Scale: Fair     Standing balance support: Bilateral upper extremity supported;During functional activity Standing balance-Leahy Scale: Poor Standing balance comment: Relied heavily on outside support.                            Cognition Arousal/Alertness: Awake/alert Behavior During Therapy: WFL for tasks assessed/performed Overall Cognitive Status: Within Functional Limits for tasks assessed                                 General Comments: Pt states she is not thinking clearly but completed all screens with no deficits.       Exercises      General Comments        Pertinent Vitals/Pain Pain Assessment: No/denies pain    Home Living                      Prior Function            PT Goals (current goals can now be found  in the care plan section) Acute Rehab PT Goals Patient Stated Goal: to be able to return home. PT Goal Formulation: With patient Time For Goal Achievement: 12/08/19 Potential to Achieve Goals: Fair Progress towards PT goals: Progressing toward goals    Frequency    Min 3X/week      PT Plan Current plan remains appropriate    Co-evaluation              AM-PAC PT "6 Clicks" Mobility   Outcome Measure  Help needed turning from your back to your side while in a flat bed without using bedrails?: None Help needed moving from lying on your back to sitting on the side of a flat bed without using bedrails?: A Little Help needed moving to and from a bed to a chair (including a  wheelchair)?: A Little Help needed standing up from a chair using your arms (e.g., wheelchair or bedside chair)?: A Little Help needed to walk in hospital room?: A Lot Help needed climbing 3-5 steps with a railing? : A Lot 6 Click Score: 17    End of Session Equipment Utilized During Treatment: Gait belt Activity Tolerance: Patient tolerated treatment well Patient left: with call bell/phone within reach;in bed;with bed alarm set Nurse Communication: Mobility status PT Visit Diagnosis: Unsteadiness on feet (R26.81);Muscle weakness (generalized) (M62.81);History of falling (Z91.81);Difficulty in walking, not elsewhere classified (R26.2)     Time: [id number] PT Time Calculation (min) (ACUTE ONLY): 19 min  Charges:  $Therapeutic Activity: 8-22 mins                     Ellamae Sia, PT, DPT Acute Rehabilitation Services Pager [phone] Office [phone]    Courtney Shea 11/24/2019, 5:24 PM

## 2019-11-24 NOTE — Evaluation (Signed)
Physical Therapy Evaluation Patient Details Name: Courtney Shea MRN: [id number] DOB: [birth year] Today's Date: 11/24/2019   History of Present Illness  Pt admitted with  a 20 minute episode of aphasia and R arm numbness.  MRI-. Pt had TIA 5 years ago.  Pt with h/o HTN and breast CA.  Pt feel in 12/2018 and broke L humerous.  Pt fell again in 08/2019 with resulting subdural hematoma.  Pt feels she has had a steady decline since November with her balance and independence.    Clinical Impression  Pt admitted with above. Prior to admission, pt lives with her husband and is a limited household ambulator with a walker; has been receiving HHPT/OT. On PT evaluation, pt presents with fear of falling, balance impairments, weakness. Upon standing from chair, pt with elevated, irregular HR to 154 bpm, BP 184/99. Returned to sitting and Chiropodist. Further ambulation deferred due to tachycardic episode. Will continue to progress as tolerated; see below for recommendations.    Follow Up Recommendations Home health PT;Supervision/Assistance - 24 hour (active with [facility])    Equipment Recommendations  None recommended by PT (has all DME)   Recommendations for Other Services       Precautions / Restrictions Precautions Precautions: Fall;Other (comment) Precaution Comments: Watch HR Restrictions Weight Bearing Restrictions: No      Mobility  Bed Mobility Overal bed mobility: Needs Assistance Bed Mobility: Supine to Sit     Supine to sit: Min assist     General bed mobility comments: OOB in chair  Transfers Overall transfer level: Needs assistance Equipment used: Rolling walker (2 wheeled) Transfers: Sit to/from Stand Sit to Stand: Min assist Stand pivot transfers: Mod assist       General transfer comment: Light minA to stand from recliner and march in place; increased trunk flexion  Ambulation/Gait                Stairs            Wheelchair Mobility     Modified Rankin (Stroke Patients Only)       Balance Overall balance assessment: Needs assistance Sitting-balance support: Feet supported Sitting balance-Leahy Scale: Fair Sitting balance - Comments: Pt with slight L lean Postural control: Left lateral lean Standing balance support: Bilateral upper extremity supported;During functional activity Standing balance-Leahy Scale: Poor Standing balance comment: Relied heavily on outside support.                             Pertinent Vitals/Pain Pain Assessment: No/denies pain    Home Living Family/patient expects to be discharged to:: Private residence Living Arrangements: Spouse/significant other Available Help at Discharge: Family;Available 24 hours/day Type of Home: House Home Access: Stairs to enter Entrance Stairs-Rails: None Entrance Stairs-Number of Steps: 1 Home Layout: One level Home Equipment: Walker - 2 wheels;Cane - single point; Wheelchair (manual) Additional Comments: Pt has had [place] and PT since her fall in November.  Pt currently does not take showers and has not since Nov.  Not a goal for as she gets very cold.    Prior Function Level of Independence: Needs assistance   Gait / Transfers Assistance Needed: Uses walker at all times in home.  ADL's / Homemaking Assistance Needed: supervision with most adls with use of walker. Pt sponge bathes.  Husband does all driving and a lot of IADLs since November.        Hand Dominance   Dominant Hand: Right  Extremity/Trunk Assessment   Upper Extremity Assessment Upper Extremity Assessment: Overall WFL for tasks assessed    Lower Extremity Assessment Lower Extremity Assessment: Overall WFL for tasks assessed    Cervical / Trunk Assessment Cervical / Trunk Assessment: Normal  Communication   Communication: No difficulties  Cognition Arousal/Alertness: Awake/alert Behavior During Therapy: WFL for tasks assessed/performed Overall Cognitive  Status: Within Functional Limits for tasks assessed                                 General Comments: Pt states she is not thinking clearly but completed all screens with no deficits.       General Comments General comments (skin integrity, edema, etc.): Pt very debilitated and appears to have had an overall decline in function since November.  Pt requires some assist with adls and mobility but feel pt would be best served doing [facility] therapy if her husband can handle caring for her.  Pt is not at baseline and will need more assist than what she has been getting.      Exercises General Exercises - Lower Extremity Long Arc Quad: Both;10 reps;Seated Hip Flexion/Marching: Both;10 reps;Seated   Assessment/Plan    PT Assessment Patient needs continued PT services  PT Problem List Decreased strength;Decreased activity tolerance;Decreased balance;Decreased mobility;Cardiopulmonary status limiting activity       PT Treatment Interventions DME instruction;Gait training;Functional mobility training;Therapeutic activities;Therapeutic exercise;Balance training;Patient/family education    PT Goals (Current goals can be found in the Care Plan section)  Acute Rehab PT Goals Patient Stated Goal: to be able to return home. PT Goal Formulation: With patient Time For Goal Achievement: 12/08/19 Potential to Achieve Goals: Fair    Frequency Min 3X/week   Barriers to discharge        Co-evaluation               AM-PAC PT "6 Clicks" Mobility  Outcome Measure Help needed turning from your back to your side while in a flat bed without using bedrails?: None Help needed moving from lying on your back to sitting on the side of a flat bed without using bedrails?: A Little Help needed moving to and from a bed to a chair (including a wheelchair)?: A Little Help needed standing up from a chair using your arms (e.g., wheelchair or bedside chair)?: A Little Help needed to walk in hospital  room?: A Lot Help needed climbing 3-5 steps with a railing? : A Lot 6 Click Score: 17    End of Session Equipment Utilized During Treatment: Gait belt Activity Tolerance: Treatment limited secondary to medical complications (Comment)(tachycardic) Patient left: in chair;with call bell/phone within reach Nurse Communication: Other (comment)(elevated HR with activity) PT Visit Diagnosis: Unsteadiness on feet (R26.81);Muscle weakness (generalized) (M62.81);History of falling (Z91.81);Difficulty in walking, not elsewhere classified (R26.2)    Time: [id number] PT Time Calculation (min) (ACUTE ONLY): 24 min   Charges:   PT Evaluation $PT Eval Moderate Complexity: 1 Mod PT Treatments $Therapeutic Activity: 8-22 mins        Ellamae Sia, PT, DPT Acute Rehabilitation Services Pager [phone] Office [phone]   Deno Etienne 11/24/2019, 11:19 AM

## 2019-11-24 NOTE — Progress Notes (Signed)
SLP Cancellation Note  Patient Details Name: Courtney Shea MRN: [id number] DOB: [birth year]   Cancelled treatment:       Reason Eval/Treat Not Completed: Patient at procedure or test/unavailable. Pt currently working with OT. Will continue efforts.  Afsheen Antony B. Quentin Ore, [facility], [place] Speech Language Pathologist Office: [phone] Pager: [phone]   Shonna Chock 11/24/2019, 9:20 AM

## 2019-11-25 ENCOUNTER — Encounter (HOSPITAL_COMMUNITY)
Admission: EM | Disposition: A | Discharge status: Home / Self Care | Payer: Self-pay | Source: Home / Self Care | Attending: Emergency Medicine

## 2019-11-25 ENCOUNTER — Ambulatory Visit (HOSPITAL_BASED_OUTPATIENT_CLINIC_OR_DEPARTMENT_OTHER): Payer: Medicare Other

## 2019-11-25 DIAGNOSIS — I639 Cerebral infarction, unspecified: Secondary | ICD-10-CM

## 2019-11-25 DIAGNOSIS — I6389 Other cerebral infarction: Secondary | ICD-10-CM

## 2019-11-25 DIAGNOSIS — G459 Transient cerebral ischemic attack, unspecified: Secondary | ICD-10-CM | POA: Diagnosis not present

## 2019-11-25 DIAGNOSIS — N3000 Acute cystitis without hematuria: Secondary | ICD-10-CM | POA: Diagnosis not present

## 2019-11-25 HISTORY — PX: LOOP RECORDER INSERTION: EP1214

## 2019-11-25 SURGERY — LOOP RECORDER INSERTION

## 2019-11-25 MED ORDER — LIDOCAINE HCL (PF) 1 % IJ SOLN
INTRAMUSCULAR | Status: DC | PRN
  Administered 2019-11-25: 2 mL

## 2019-11-25 MED ORDER — LIDOCAINE-EPINEPHRINE 1 %-1:100000 IJ SOLN
INTRAMUSCULAR | Status: AC
  Filled 2019-11-25: qty 1

## 2019-11-25 MED ORDER — ASPIRIN 81 MG PO TBEC: 81.0000 mg | DELAYED_RELEASE_TABLET | Freq: Every day | ORAL | Status: DC

## 2019-11-25 MED ORDER — SODIUM CHLORIDE 0.9 % IV SOLN
1.0000 g | INTRAVENOUS | Status: DC
  Administered 2019-11-25: 1 g via INTRAVENOUS
  Filled 2019-11-25: qty 10

## 2019-11-25 MED ORDER — CLOPIDOGREL BISULFATE 75 MG PO TABS: 75.0000 mg | ORAL_TABLET | Freq: Every day | ORAL | 0 refills | Status: AC

## 2019-11-25 MED ORDER — ATORVASTATIN CALCIUM 20 MG PO TABS: 20.0000 mg | ORAL_TABLET | Freq: Every day | ORAL | 0 refills | Status: AC

## 2019-11-25 MED FILL — CLOPIDOGREL 75 MG TABLET: 75 | 21 days supply | Qty: 21 | Fill #0

## 2019-11-25 MED FILL — ATORVASTATIN CALCIUM 20 MG: 20 | 30 days supply | Qty: 30 | Fill #0

## 2019-11-25 SURGICAL SUPPLY — 2 items
LOOP REVEAL LINQSYS (Prosthesis & Implant Heart) ×2 IMPLANT
PACK LOOP INSERTION (CUSTOM PROCEDURE TRAY) ×3 IMPLANT

## 2019-11-25 NOTE — TOC Transition Note (Signed)
Transition of Care [facility]) - CM/SW Discharge Note   Patient Details  Name: Courtney Shea MRN: [id number] Date of Birth: 1930-03-13  Transition of Care [facility]) CM/SW Contact:  Pollie Friar, RN Phone Number: 11/25/2019, 3:50 PM   Clinical Narrative:    Pt is discharging home with [facility] services through [facility]. Cassie with [facility] aware of d/c. No DME needs. Spouse to provide transport home.   Final next level of care: Home w Home Health Services Barriers to Discharge: Continued Medical Work up   Patient Goals and CMS Choice   CMS Medicare.gov Compare Post Acute Care list provided to:: Patient Choice offered to / list presented to : Patient  Discharge Placement                       Discharge Plan and Services   Discharge Planning Services: CM Consult Post Acute Care Choice: Home Health                    HH Arranged: PT, OT [facility] Agency: [facility] Date [place]: 11/25/19   Representative spoke with at [place]: cassie  Social Determinants of Health ([place]) Interventions     Readmission Risk Interventions No flowsheet data found.

## 2019-11-25 NOTE — Interval H&P Note (Signed)
History and Physical Interval Note:  11/25/2019 1:00 PM  Courtney Shea  has presented today for surgery, with the diagnosis of cryptogenic stroke.  The various methods of treatment have been discussed with the patient and family. After consideration of risks, benefits and other options for treatment, the patient has consented to  Procedure(s): LOOP RECORDER INSERTION (N/A) as a surgical intervention.  The patient's history has been reviewed, patient examined, no change in status, stable for surgery.  I have reviewed the patient's chart and labs.  Questions were answered to the patient's satisfaction.     Thompson Grayer

## 2019-11-25 NOTE — Progress Notes (Signed)
STROKE TEAM PROGRESS NOTE   INTERVAL HISTORY No acute event overnight. Had loop recorder placed today.    Vitals:   11/25/19 0403 11/25/19 0411 11/25/19 0750 11/25/19 1219  BP: (!) 150/78 (!) 150/78 (!) 150/71 (!) 151/83  Pulse: 72 65 72 75  Resp: 16 16 14 14   Temp: 98.2 F (36.8 C) 97.7 F (36.5 C) 97.7 F (36.5 C) 97.7 F (36.5 C)  TempSrc: Oral Oral Oral Oral  SpO2: 95% 97% 97% 96%  Weight:      Height:        CBC:  Recent Labs  Lab 11/23/19 1901 11/23/19 1928  WBC 8.7  --   NEUTROABS 6.6  --   HGB 12.6 12.6  HCT 37.6 37.0  MCV 87.6  --   PLT 280  --     Basic Metabolic Panel:  Recent Labs  Lab 11/23/19 1901 11/23/19 1928  NA 134* 133*  K 3.8 3.7  CL 99 98  CO2 22  --   GLUCOSE 136* 133*  BUN 15 15  CREATININE 0.65 0.60  CALCIUM 9.3  --    Lipid Panel:     Component Value Date/Time   CHOL 164 11/24/2019 0141   TRIG 73 11/24/2019 0141   HDL 48 11/24/2019 0141   CHOLHDL 3.4 11/24/2019 0141   VLDL 15 11/24/2019 0141   LDLCALC 101 (H) 11/24/2019 0141   HgbA1c:  Lab Results  Component Value Date   HGBA1C 5.5 11/24/2019   Urine Drug Screen:     Component Value Date/Time   LABOPIA NONE DETECTED 11/23/2019 1901   COCAINSCRNUR NONE DETECTED 11/23/2019 1901   LABBENZ NONE DETECTED 11/23/2019 1901   AMPHETMU NONE DETECTED 11/23/2019 1901   THCU NONE DETECTED 11/23/2019 1901   LABBARB NONE DETECTED 11/23/2019 1901    Alcohol Level     Component Value Date/Time   ETH <10 11/23/2019 1901    IMAGING past 48 hours CT HEAD WO CONTRAST  Result Date: 11/23/2019 CLINICAL DATA:  Transient ischemic attack. EXAM: CT HEAD WITHOUT CONTRAST TECHNIQUE: Contiguous axial images were obtained from the base of the skull through the vertex without intravenous contrast. COMPARISON:  10/19/2019.  12/20/2017.  12/04/2011 FINDINGS: Brain: Age related volume loss. No focal abnormality affects brainstem or cerebellum. Cerebral hemispheres show chronic small-vessel  change of the white matter. Ventricular prominence presumed secondary 2 ex vacuo enlargement. Ventricular size is nearly stable going as far back as 2013. Vascular: There is atherosclerotic calcification of the major vessels at the base of the brain. Skull: Negative Sinuses/Orbits: Clear/normal Other: None IMPRESSION: No acute finding. Chronic atrophy and small-vessel ischemic change affecting the brain. Ventricular prominence presumed secondary to ex vacuo enlargement, fairly stable since 2013. Electronically Signed   By: Nelson Chimes M.D.   On: 11/23/2019 20:05   MR ANGIO HEAD WO CONTRAST  Result Date: 11/24/2019 CLINICAL DATA:  Aphasia, right hand numbness EXAM: MRI HEAD WITHOUT CONTRAST MRA HEAD WITHOUT CONTRAST TECHNIQUE: Multiplanar, multiecho pulse sequences of the brain and surrounding structures were obtained without intravenous contrast. Angiographic images of the head were obtained using MRA technique without contrast. COMPARISON:  Brain MRI 2014 FINDINGS: MRI HEAD FINDINGS Brain: There is no acute infarction or intracranial hemorrhage. There is no intracranial mass, mass effect, or edema. There is no hydrocephalus or extra-axial fluid collection. Patchy T2 hyperintensity in the supratentorial white matter is nonspecific but may reflect mild to moderate chronic microvascular ischemic changes similar to prior study. Prominence of the ventricles sulci reflects  stable parenchymal volume loss. Vascular: Major vessel flow voids at the skull base are preserved. Skull and upper cervical spine: Normal marrow signal is preserved. Sinuses/Orbits: Minor mucosal thickening.  Orbits are unremarkable. Other: Sella is unremarkable.  Trace mastoid fluid opacification. MRA HEAD FINDINGS Intracranial internal carotid arteries are patent. Middle and anterior cerebral arteries are patent. Intracranial vertebral arteries, basilar artery, posterior cerebral arteries are patent. Bilateral posterior communicating arteries  are present. There is no significant stenosis or aneurysm. IMPRESSION: No acute infarction, hemorrhage, or mass. Stable chronic microvascular ischemic changes. No proximal intracranial vessel occlusion or significant stenosis. Electronically Signed   By: Macy Mis M.D.   On: 11/24/2019 08:56   MR BRAIN WO CONTRAST  Result Date: 11/24/2019 CLINICAL DATA:  Aphasia, right hand numbness EXAM: MRI HEAD WITHOUT CONTRAST MRA HEAD WITHOUT CONTRAST TECHNIQUE: Multiplanar, multiecho pulse sequences of the brain and surrounding structures were obtained without intravenous contrast. Angiographic images of the head were obtained using MRA technique without contrast. COMPARISON:  Brain MRI 2014 FINDINGS: MRI HEAD FINDINGS Brain: There is no acute infarction or intracranial hemorrhage. There is no intracranial mass, mass effect, or edema. There is no hydrocephalus or extra-axial fluid collection. Patchy T2 hyperintensity in the supratentorial white matter is nonspecific but may reflect mild to moderate chronic microvascular ischemic changes similar to prior study. Prominence of the ventricles sulci reflects stable parenchymal volume loss. Vascular: Major vessel flow voids at the skull base are preserved. Skull and upper cervical spine: Normal marrow signal is preserved. Sinuses/Orbits: Minor mucosal thickening.  Orbits are unremarkable. Other: Sella is unremarkable.  Trace mastoid fluid opacification. MRA HEAD FINDINGS Intracranial internal carotid arteries are patent. Middle and anterior cerebral arteries are patent. Intracranial vertebral arteries, basilar artery, posterior cerebral arteries are patent. Bilateral posterior communicating arteries are present. There is no significant stenosis or aneurysm. IMPRESSION: No acute infarction, hemorrhage, or mass. Stable chronic microvascular ischemic changes. No proximal intracranial vessel occlusion or significant stenosis. Electronically Signed   By: Macy Mis M.D.    On: 11/24/2019 08:56   EP PPM/ICD IMPLANT  Result Date: 11/25/2019 SURGEON:  Thompson Grayer, MD   PREPROCEDURE DIAGNOSIS:  Cryptogenic Stroke   POSTPROCEDURE DIAGNOSIS:  Cryptogenic Stroke    PROCEDURES:  1. Implantable loop recorder implantation   INTRODUCTION:  Courtney Shea is a 86 y.o. female with a history of unexplained stroke who presents today for implantable loop implantation.  The patient has had a cryptogenic stroke.  Despite an extensive workup by neurology, no reversible causes have been identified.  she has worn telemetry during which she did not have arrhythmias.  There is significant concern for possible atrial fibrillation as the cause for the patients stroke.  The patient therefore presents today for implantable loop implantation.   DESCRIPTION OF PROCEDURE:  Informed written consent was obtained.  The patient required no sedation for the procedure today.  The patients left chest was prepped and draped. Mapping over the patient's chest was performed to identify the appropriate ILR site.  This area was found to be the left inframammary region. The skin overlying this region was infiltrated with lidocaine for local analgesia.  A 0.5-cm incision was made at the implant site.  A subcutaneous ILR pocket was fashioned using a combination of sharp and blunt dissection.  A Medtronic Reveal Linq model [id number] implantable loop recorder was then placed into the pocket R waves were very prominent and measured > 0.2 mV. EBL<1 ml.  Steri- Strips and a sterile dressing were  then applied.  There were no early apparent complications.   CONCLUSIONS:  1. Successful implantation of a Medtronic Reveal LINQ implantable loop recorder for cryptogenic stroke  2. No early apparent complications. Thompson Grayer MD, [facility] 11/25/2019 1:34 PM   ECHOCARDIOGRAM COMPLETE  Result Date: 11/24/2019   ECHOCARDIOGRAM REPORT   Patient Name:   Courtney Shea Date of Exam: 11/24/2019 Medical Rec #:  [id number]        Height:       64.0  in Accession #:    [id number]       Weight:       160.5 lb Date of Birth:  03-15-1930       BSA:          1.78 m Patient Age:    63 years         BP:           172/101 mmHg Patient Gender: F                HR:           81 bpm. Exam Location:  Inpatient Procedure: 2D Echo and Intracardiac Opacification Agent Indications:    TIA  History:        Patient has prior history of Echocardiogram examinations, most                 recent 06/20/2013. Risk Factors:Hypertension. H/o TIA.  Sonographer:    Clayton Lefort RDCS (AE) Referring Phys: [id number] [place]  1. Left ventricular ejection fraction, by visual estimation, is 55 to 60%. The left ventricle has normal function. Left ventricular septal wall thickness was moderately increased. Moderately increased left ventricular posterior wall thickness. There is moderately increased left ventricular hypertrophy.  2. Definity contrast agent was given IV to delineate the left ventricular endocardial borders.  3. Left ventricular diastolic parameters are consistent with Grade I diastolic dysfunction (impaired relaxation).  4. The left ventricle has no regional wall motion abnormalities.  5. Global right ventricle has normal systolic function.The right ventricular size is normal. No increase in right ventricular wall thickness.  6. Left atrial size was normal.  7. Right atrial size was normal.  8. The mitral valve is normal in structure. Trivial mitral valve regurgitation. No evidence of mitral stenosis.  9. The tricuspid valve is normal in structure. 10. The tricuspid valve is normal in structure. Tricuspid valve regurgitation is mild. 11. The aortic valve is tricuspid. Aortic valve regurgitation is not visualized. No evidence of aortic valve sclerosis or stenosis. 12. The pulmonic valve was normal in structure. Pulmonic valve regurgitation is not visualized. 13. Normal pulmonary artery systolic pressure. 14. The inferior vena cava is normal in size with greater  than 50% respiratory variability, suggesting right atrial pressure of 3 mmHg. FINDINGS  Left Ventricle: Left ventricular ejection fraction, by visual estimation, is 55 to 60%. The left ventricle has normal function. Definity contrast agent was given IV to delineate the left ventricular endocardial borders. The left ventricle has no regional wall motion abnormalities. Moderately increased left ventricular posterior wall thickness. There is moderately increased left ventricular hypertrophy. Left ventricular diastolic parameters are consistent with Grade I diastolic dysfunction (impaired relaxation). Normal left atrial pressure. Right Ventricle: The right ventricular size is normal. No increase in right ventricular wall thickness. Global RV systolic function is has normal systolic function. The tricuspid regurgitant velocity is 2.54 m/s, and with an assumed right atrial pressure  of 3 mmHg, the estimated right ventricular  systolic pressure is normal at 28.9 mmHg. Left Atrium: Left atrial size was normal in size. Right Atrium: Right atrial size was normal in size Pericardium: There is no evidence of pericardial effusion. Mitral Valve: The mitral valve is normal in structure. Trivial mitral valve regurgitation. No evidence of mitral valve stenosis by observation. Tricuspid Valve: The tricuspid valve is normal in structure. Tricuspid valve regurgitation is mild. Aortic Valve: The aortic valve is tricuspid. Aortic valve regurgitation is not visualized. The aortic valve is structurally normal, with no evidence of sclerosis or stenosis. Aortic valve mean gradient measures 3.0 mmHg. Aortic valve peak gradient measures 7.1 mmHg. Aortic valve area, by VTI measures 1.96 cm. Pulmonic Valve: The pulmonic valve was normal in structure. Pulmonic valve regurgitation is not visualized. Pulmonic regurgitation is not visualized. Aorta: The aortic root, ascending aorta and aortic arch are all structurally normal, with no evidence of  dilitation or obstruction. Venous: The inferior vena cava is normal in size with greater than 50% respiratory variability, suggesting right atrial pressure of 3 mmHg. IAS/Shunts: No atrial level shunt detected by color flow Doppler. There is no evidence of a patent foramen ovale. No ventricular septal defect is seen or detected. There is no evidence of an atrial septal defect.  LEFT VENTRICLE PLAX 2D LVIDd:         4.00 cm  Diastology LVIDs:         3.04 cm  LV e' lateral:   8.84 cm/s LV PW:         1.41 cm  LV E/e' lateral: 4.5 LV IVS:        1.47 cm  LV e' medial:    4.05 cm/s LVOT diam:     1.80 cm  LV E/e' medial:  9.9 LV SV:         34 ml LV SV Index:   18.48 LVOT Area:     2.54 cm  RIGHT VENTRICLE             IVC RV Basal diam:  2.27 cm     IVC diam: 1.80 cm RV S prime:     14.40 cm/s TAPSE (M-mode): 2.1 cm LEFT ATRIUM             Index       RIGHT ATRIUM           Index LA diam:        1.60 cm 0.90 cm/m  RA Area:     11.80 cm LA Vol (A2C):   46.1 ml 25.87 ml/m RA Volume:   23.90 ml  13.41 ml/m LA Vol (A4C):   30.6 ml 17.17 ml/m LA Biplane Vol: 39.3 ml 22.06 ml/m  AORTIC VALVE AV Area (Vmax):    1.71 cm AV Area (Vmean):   1.78 cm AV Area (VTI):     1.96 cm AV Vmax:           133.00 cm/s AV Vmean:          85.100 cm/s AV VTI:            0.221 m AV Peak Grad:      7.1 mmHg AV Mean Grad:      3.0 mmHg LVOT Vmax:         89.40 cm/s LVOT Vmean:        59.600 cm/s LVOT VTI:          0.170 m LVOT/AV VTI ratio: 0.77  AORTA Ao Root diam: 2.50 cm Ao Asc  diam:  3.50 cm MITRAL VALVE                        TRICUSPID VALVE MV Area (PHT): 2.39 cm             TR Peak grad:   25.9 mmHg MV PHT:        91.93 msec           TR Vmax:        273.00 cm/s MV Decel Time: 317 msec MV E velocity: 39.90 cm/s 103 cm/s  SHUNTS MV A velocity: 61.80 cm/s 70.3 cm/s Systemic VTI:  0.17 m MV E/A ratio:  0.65       1.5       Systemic Diam: 1.80 cm  Skeet Latch MD Electronically signed by Skeet Latch MD Signature Date/Time:  11/24/2019/3:15:40 PM    Final    VAS US CAROTID  Result Date: 11/24/2019 Carotid Arterial Duplex Study Indications:       TIA. Risk Factors:      Hypertension, hyperlipidemia, prior CVA. Comparison Study:  no prior Performing Technologist: Abram Sander RVS  Examination Guidelines: A complete evaluation includes B-mode imaging, spectral Doppler, color Doppler, and power Doppler as needed of all accessible portions of each vessel. Bilateral testing is considered an integral part of a complete examination. Limited examinations for reoccurring indications may be performed as noted.  Right Carotid Findings: +----------+--------+--------+--------+------------------+--------+           PSV cm/sEDV cm/sStenosisPlaque DescriptionComments +----------+--------+--------+--------+------------------+--------+ CCA Prox  88      10              heterogenous               +----------+--------+--------+--------+------------------+--------+ CCA Distal59      8               heterogenous               +----------+--------+--------+--------+------------------+--------+ ICA Prox  72      16      1-39%   heterogenous               +----------+--------+--------+--------+------------------+--------+ ICA Distal62      16                                         +----------+--------+--------+--------+------------------+--------+ ECA       76                                                 +----------+--------+--------+--------+------------------+--------+ +----------+--------+-------+--------+-------------------+           PSV cm/sEDV cmsDescribeArm Pressure (mmHG) +----------+--------+-------+--------+-------------------+ [id number]                                        +----------+--------+-------+--------+-------------------+ +---------+--------+--+--------+-+---------+ VertebralPSV cm/s73EDV cm/s9Antegrade +---------+--------+--+--------+-+---------+  Left Carotid  Findings: +----------+--------+--------+--------+------------------+--------+           PSV cm/sEDV cm/sStenosisPlaque DescriptionComments +----------+--------+--------+--------+------------------+--------+ CCA Prox  81      13              heterogenous               +----------+--------+--------+--------+------------------+--------+  CCA Distal70      12              heterogenous               +----------+--------+--------+--------+------------------+--------+ ICA Prox  62      11      1-39%   heterogenous               +----------+--------+--------+--------+------------------+--------+ ICA Distal77      20                                         +----------+--------+--------+--------+------------------+--------+ ECA       108     5                                          +----------+--------+--------+--------+------------------+--------+ +----------+--------+--------+--------+-------------------+           PSV cm/sEDV cm/sDescribeArm Pressure (mmHG) +----------+--------+--------+--------+-------------------+ [id number]                                         +----------+--------+--------+--------+-------------------+ +---------+--------+--+--------+-+---------+ VertebralPSV cm/s74EDV cm/s8Antegrade +---------+--------+--+--------+-+---------+   Summary: Right Carotid: Velocities in the right ICA are consistent with a 1-39% stenosis. Left Carotid: Velocities in the left ICA are consistent with a 1-39% stenosis. Vertebrals: Bilateral vertebral arteries demonstrate antegrade flow. *See table(s) above for measurements and observations.     Preliminary    VAS [place] LOWER EXTREMITY VENOUS (DVT)  Result Date: 11/25/2019  Lower Venous Study Indications: Stroke.  Risk Factors: Cancer Breast. Comparison Study: No prior exam. Performing Technologist: Baldwin Crown RDMS, RVT  Examination Guidelines: A complete evaluation includes B-mode imaging, spectral  Doppler, color Doppler, and power Doppler as needed of all accessible portions of each vessel. Bilateral testing is considered an integral part of a complete examination. Limited examinations for reoccurring indications may be performed as noted.  +---------+---------------+---------+-----------+----------+--------------+ RIGHT    CompressibilityPhasicitySpontaneityPropertiesThrombus Aging +---------+---------------+---------+-----------+----------+--------------+ CFV      Full           Yes      Yes                                 +---------+---------------+---------+-----------+----------+--------------+ SFJ      Full                                                        +---------+---------------+---------+-----------+----------+--------------+ FV Prox  Full                                                        +---------+---------------+---------+-----------+----------+--------------+ FV Mid   Full                                                        +---------+---------------+---------+-----------+----------+--------------+  FV DistalFull                                                        +---------+---------------+---------+-----------+----------+--------------+ PFV      Full                                                        +---------+---------------+---------+-----------+----------+--------------+ POP      Full           Yes      Yes                                 +---------+---------------+---------+-----------+----------+--------------+ PTV      Full                                                        +---------+---------------+---------+-----------+----------+--------------+ PERO     Full                                                        +---------+---------------+---------+-----------+----------+--------------+   +---------+---------------+---------+-----------+----------+--------------+ LEFT      CompressibilityPhasicitySpontaneityPropertiesThrombus Aging +---------+---------------+---------+-----------+----------+--------------+ CFV      Full           Yes      Yes                                 +---------+---------------+---------+-----------+----------+--------------+ SFJ      Full                                                        +---------+---------------+---------+-----------+----------+--------------+ FV Prox  Full                                                        +---------+---------------+---------+-----------+----------+--------------+ FV Mid   Full                                                        +---------+---------------+---------+-----------+----------+--------------+ FV DistalFull                                                        +---------+---------------+---------+-----------+----------+--------------+  PFV      Full                                                        +---------+---------------+---------+-----------+----------+--------------+ POP      Full           Yes      Yes                                 +---------+---------------+---------+-----------+----------+--------------+ PTV      Full                                                        +---------+---------------+---------+-----------+----------+--------------+ PERO     Full                                                        +---------+---------------+---------+-----------+----------+--------------+     Summary: Right: There is no evidence of deep vein thrombosis in the lower extremity. No cystic structure found in the popliteal fossa. Left: There is no evidence of deep vein thrombosis in the lower extremity. No cystic structure found in the popliteal fossa.  *See table(s) above for measurements and observations.    Preliminary     PHYSICAL EXAM   Temp:  [97.7 F (36.5 C)-98.4 F (36.9 C)] 97.7 F (36.5 C) (01/29  1219) Pulse Rate:  [65-85] 75 (01/29 1219) Resp:  [14-18] 14 (01/29 1219) BP: (147-190)/(71-122) 151/83 (01/29 1219) SpO2:  [95 %-98 %] 96 % (01/29 1219)  General - Well nourished, well developed, in no apparent distress.  Ophthalmologic - fundi not visualized due to noncooperation.  Cardiovascular - Regular rhythm and rate.  Mental Status -  Level of arousal and orientation to time, place, and person were intact. Language including expression, naming, repetition, comprehension was assessed and found intact. Fund of Knowledge was assessed and was intact.  Cranial Nerves II - XII - II - Visual field intact OU. III, IV, VI - Extraocular movements intact. V - Facial sensation intact bilaterally. VII - Facial movement intact bilaterally. VIII - Hearing & vestibular intact bilaterally. X - Palate elevates symmetrically. XI - Chin turning & shoulder shrug intact bilaterally. XII - Tongue protrusion intact.  Motor Strength - The patient's strength was normal in all extremities and pronator drift was absent.  Bulk was normal and fasciculations were absent.   Motor Tone - Muscle tone was assessed at the neck and appendages and was normal.  Reflexes - The patient's reflexes were symmetrical in all extremities and she had no pathological reflexes.  Sensory - Light touch, temperature/pinprick were assessed and were symmetrical.    Coordination - The patient had normal movements in the hands with no ataxia or dysmetria.  Tremor was absent.  Gait and Station - deferred.   ASSESSMENT/PLAN Courtney Shea is a 86 y.o. female with history of breast cancer, HTN, TIA previously on aspirin and plavix, SDH in  Nov 2020 w/o antiplatelets restarted presenting with transient expressive aphasia and R sided numbness accompanied by HA.   L brain embolic TIA  CT head No acute abnormality. Small vessel disease. Atrophy. Ventricular prominence stable since 2013  MRI  No acute infarct   MRA  Unremarkable   Carotid Doppler  unremarkable  2D Echo EF 55-60%  LE venous doppler DVT  Loop recorder placed (hx syncope in 2013 and evaluated by Crenshaw who recommended loop placement if recurrence)  LDL 101  HgbA1c 5.5  Lovenox 40 mg sq daily for VTE prophylaxis  No antithrombotic prior to admission, previously on aspirin and plavix stopped in Nov following SDH from fall never resumed, now on aspirin 81 mg daily and clopidogrel 75 mg daily. Continue DAPT x 3 weeks then aspirin alone.    Therapy recommendations:  HH PT and OT  Disposition:  Return home Follow-up Stroke Clinic at [facility] in 4 weeks. Office will call with appointment date and time. Order placed.  Hx of TIA  2014 - TIA. Presented w/  Right followed by L sided weakness. Workup negaive. Dr. Inda Merlin added plavix to aspirin at that time.  Hypertension  Stable . BP goal normotensive  Hyperlipidemia  Home meds:  No statin  Now on lipitor 20  Will not use intensive statin given advanced age   LDL 101, goal < 70  Continue statin at discharge  Other Stroke Risk Factors  Advanced age  Other Active Problems  Hx R breast cancer s/p partial mastectomy and XRT 2016. On Arimidex  UTI on rocephin  Hypothyroidism on synthroid  Hospital day # 0  Neurology will sign off. Please call with questions. Pt will follow up with stroke clinic NP at [facility] in about 4 weeks. Thanks for the consult.   Rosalin Hawking, MD PhD Stroke Neurology 11/25/2019 2:18 PM   To contact Stroke Continuity provider, please refer to [email or web address] After hours, contact General Neurology

## 2019-11-25 NOTE — Progress Notes (Signed)
Bilateral lower extremity venous duplex exam completed.  Preliminary results can be found under CV proc under chart review.  11/25/2019 11:11 AM  Hilda Wexler, K., RDMS, RVT

## 2019-11-25 NOTE — Discharge Summary (Addendum)
Physician Discharge Summary  Courtney Shea [id number] DOB: [birth year] DOA: 11/23/2019  PCP: Josetta Huddle, MD  Admit date: 11/23/2019 Discharge date: 11/25/2019  Admitted From: Home Discharge disposition: Home   Recommendations for Outpatient Follow-Up:   1. Loop recorder placed  2. Resume Home health   Discharge Diagnosis:   Principal Problem:   TIA (transient ischemic attack) Active Problems:   Hypothyroidism   HTN (hypertension)   UTI (urinary tract infection)   Carcinoma of upper-inner quadrant of left female breast [facility])    Discharge Condition: Improved.  Diet recommendation: Low sodium, heart healthy  Wound care: per EP  Code status: DNR   History of Present Illness:   Courtney Shea is a 86 y.o. female with medical history significant of breast cancer, hypertension, hypothyroidism, history of prior TIA, subdural hematoma in November 2020 presenting to the ED for evaluation of aphasia and right arm numbness.  Per EMS report, patient's husband noticed that she was having a hard time getting words out.  Last seen normal at 1530.  Symptoms resolved in 20 minutes.  Patient states around lunchtime she experienced difficulty getting words out when she was trying to speak.  Her right hand felt numb at that time.  No focal weakness.  Symptoms lasted approximately 20 minutes.  States she had a similar episode about 5 years ago and was started on aspirin and Plavix after that event.  These medications were stopped in November 2020 after she had a brain bleed.  States she was previously taking Crestor which was stopped a few years ago because it caused her to have muscle pains.  Denies any fevers, cough, shortness of breath, or recent illness.    Hospital Course by Problem:   L brain embolic TIA  MRI  No acute infarct   Carotid Doppler  unremarkable  2D Echo EF 55-60%  LE venous doppler negative for dvt  Loop recorder placed (hx syncope in 2013 and  evaluated by [facility] who recommended loop placement if recurrence) -LDL 101-- statin added -HgbA1c 5.5 - neurology: aspirin 81 mg daily and clopidogrel 75 mg daily. Continue DAPT x 3 weeks then aspirin alone.    Hypertension -Stable -BP goal normotensive  Hyperlipidemia - lipitor 20 -Will not use intensive statin given advanced age  -LDL 101, goal < 70  UTI -treated with abx    Medical Consultants:   neurology   Discharge Exam:   Vitals:   11/25/19 0750 11/25/19 1219  BP: (!) 150/71 (!) 151/83  Pulse: 72 75  Resp: 14 14  Temp: 97.7 F (36.5 C) 97.7 F (36.5 C)  SpO2: 97% 96%   Vitals:   11/25/19 0403 11/25/19 0411 11/25/19 0750 11/25/19 1219  BP: (!) 150/78 (!) 150/78 (!) 150/71 (!) 151/83  Pulse: 72 65 72 75  Resp: 16 16 14 14   Temp: 98.2 F (36.8 C) 97.7 F (36.5 C) 97.7 F (36.5 C) 97.7 F (36.5 C)  TempSrc: Oral Oral Oral Oral  SpO2: 95% 97% 97% 96%  Weight:      Height:        General exam: Appears calm and comfortable.   The results of significant diagnostics from this hospitalization (including imaging, microbiology, ancillary and laboratory) are listed below for reference.     Procedures and Diagnostic Studies:   CT HEAD WO CONTRAST  Result Date: 11/23/2019 CLINICAL DATA:  Transient ischemic attack. EXAM: CT HEAD WITHOUT CONTRAST TECHNIQUE: Contiguous axial images were obtained from the base  of the skull through the vertex without intravenous contrast. COMPARISON:  10/19/2019.  12/20/2017.  12/04/2011 FINDINGS: Brain: Age related volume loss. No focal abnormality affects brainstem or cerebellum. Cerebral hemispheres show chronic small-vessel change of the white matter. Ventricular prominence presumed secondary 2 ex vacuo enlargement. Ventricular size is nearly stable going as far back as 2013. Vascular: There is atherosclerotic calcification of the major vessels at the base of the brain. Skull: Negative Sinuses/Orbits: Clear/normal Other: None  IMPRESSION: No acute finding. Chronic atrophy and small-vessel ischemic change affecting the brain. Ventricular prominence presumed secondary to ex vacuo enlargement, fairly stable since 2013. Electronically Signed   By: Nelson Chimes M.D.   On: 11/23/2019 20:05   MR ANGIO HEAD WO CONTRAST  Result Date: 11/24/2019 CLINICAL DATA:  Aphasia, right hand numbness EXAM: MRI HEAD WITHOUT CONTRAST MRA HEAD WITHOUT CONTRAST TECHNIQUE: Multiplanar, multiecho pulse sequences of the brain and surrounding structures were obtained without intravenous contrast. Angiographic images of the head were obtained using MRA technique without contrast. COMPARISON:  Brain MRI 2014 FINDINGS: MRI HEAD FINDINGS Brain: There is no acute infarction or intracranial hemorrhage. There is no intracranial mass, mass effect, or edema. There is no hydrocephalus or extra-axial fluid collection. Patchy T2 hyperintensity in the supratentorial white matter is nonspecific but may reflect mild to moderate chronic microvascular ischemic changes similar to prior study. Prominence of the ventricles sulci reflects stable parenchymal volume loss. Vascular: Major vessel flow voids at the skull base are preserved. Skull and upper cervical spine: Normal marrow signal is preserved. Sinuses/Orbits: Minor mucosal thickening.  Orbits are unremarkable. Other: Sella is unremarkable.  Trace mastoid fluid opacification. MRA HEAD FINDINGS Intracranial internal carotid arteries are patent. Middle and anterior cerebral arteries are patent. Intracranial vertebral arteries, basilar artery, posterior cerebral arteries are patent. Bilateral posterior communicating arteries are present. There is no significant stenosis or aneurysm. IMPRESSION: No acute infarction, hemorrhage, or mass. Stable chronic microvascular ischemic changes. No proximal intracranial vessel occlusion or significant stenosis. Electronically Signed   By: Macy Mis M.D.   On: 11/24/2019 08:56   MR BRAIN  WO CONTRAST  Result Date: 11/24/2019 CLINICAL DATA:  Aphasia, right hand numbness EXAM: MRI HEAD WITHOUT CONTRAST MRA HEAD WITHOUT CONTRAST TECHNIQUE: Multiplanar, multiecho pulse sequences of the brain and surrounding structures were obtained without intravenous contrast. Angiographic images of the head were obtained using MRA technique without contrast. COMPARISON:  Brain MRI 2014 FINDINGS: MRI HEAD FINDINGS Brain: There is no acute infarction or intracranial hemorrhage. There is no intracranial mass, mass effect, or edema. There is no hydrocephalus or extra-axial fluid collection. Patchy T2 hyperintensity in the supratentorial white matter is nonspecific but may reflect mild to moderate chronic microvascular ischemic changes similar to prior study. Prominence of the ventricles sulci reflects stable parenchymal volume loss. Vascular: Major vessel flow voids at the skull base are preserved. Skull and upper cervical spine: Normal marrow signal is preserved. Sinuses/Orbits: Minor mucosal thickening.  Orbits are unremarkable. Other: Sella is unremarkable.  Trace mastoid fluid opacification. MRA HEAD FINDINGS Intracranial internal carotid arteries are patent. Middle and anterior cerebral arteries are patent. Intracranial vertebral arteries, basilar artery, posterior cerebral arteries are patent. Bilateral posterior communicating arteries are present. There is no significant stenosis or aneurysm. IMPRESSION: No acute infarction, hemorrhage, or mass. Stable chronic microvascular ischemic changes. No proximal intracranial vessel occlusion or significant stenosis. Electronically Signed   By: Macy Mis M.D.   On: 11/24/2019 08:56   ECHOCARDIOGRAM COMPLETE  Result Date: 11/24/2019   ECHOCARDIOGRAM  REPORT   Patient Name:   Courtney Shea Date of Exam: 11/24/2019 Medical Rec #:  [id number]        Height:       64.0 in Accession #:    [id number]       Weight:       160.5 lb Date of Birth:  1930/04/15       BSA:           1.78 m Patient Age:    52 years         BP:           172/101 mmHg Patient Gender: F                HR:           81 bpm. Exam Location:  Inpatient Procedure: 2D Echo and Intracardiac Opacification Agent Indications:    TIA  History:        Patient has prior history of Echocardiogram examinations, most                 recent 06/20/2013. Risk Factors:Hypertension. H/o TIA.  Sonographer:    Clayton Lefort RDCS (AE) Referring Phys: [id number] [place]  1. Left ventricular ejection fraction, by visual estimation, is 55 to 60%. The left ventricle has normal function. Left ventricular septal wall thickness was moderately increased. Moderately increased left ventricular posterior wall thickness. There is moderately increased left ventricular hypertrophy.  2. Definity contrast agent was given IV to delineate the left ventricular endocardial borders.  3. Left ventricular diastolic parameters are consistent with Grade I diastolic dysfunction (impaired relaxation).  4. The left ventricle has no regional wall motion abnormalities.  5. Global right ventricle has normal systolic function.The right ventricular size is normal. No increase in right ventricular wall thickness.  6. Left atrial size was normal.  7. Right atrial size was normal.  8. The mitral valve is normal in structure. Trivial mitral valve regurgitation. No evidence of mitral stenosis.  9. The tricuspid valve is normal in structure. 10. The tricuspid valve is normal in structure. Tricuspid valve regurgitation is mild. 11. The aortic valve is tricuspid. Aortic valve regurgitation is not visualized. No evidence of aortic valve sclerosis or stenosis. 12. The pulmonic valve was normal in structure. Pulmonic valve regurgitation is not visualized. 13. Normal pulmonary artery systolic pressure. 14. The inferior vena cava is normal in size with greater than 50% respiratory variability, suggesting right atrial pressure of 3 mmHg. FINDINGS  Left Ventricle:  Left ventricular ejection fraction, by visual estimation, is 55 to 60%. The left ventricle has normal function. Definity contrast agent was given IV to delineate the left ventricular endocardial borders. The left ventricle has no regional wall motion abnormalities. Moderately increased left ventricular posterior wall thickness. There is moderately increased left ventricular hypertrophy. Left ventricular diastolic parameters are consistent with Grade I diastolic dysfunction (impaired relaxation). Normal left atrial pressure. Right Ventricle: The right ventricular size is normal. No increase in right ventricular wall thickness. Global RV systolic function is has normal systolic function. The tricuspid regurgitant velocity is 2.54 m/s, and with an assumed right atrial pressure  of 3 mmHg, the estimated right ventricular systolic pressure is normal at 28.9 mmHg. Left Atrium: Left atrial size was normal in size. Right Atrium: Right atrial size was normal in size Pericardium: There is no evidence of pericardial effusion. Mitral Valve: The mitral valve is normal in structure. Trivial mitral valve regurgitation. No evidence of mitral valve  stenosis by observation. Tricuspid Valve: The tricuspid valve is normal in structure. Tricuspid valve regurgitation is mild. Aortic Valve: The aortic valve is tricuspid. Aortic valve regurgitation is not visualized. The aortic valve is structurally normal, with no evidence of sclerosis or stenosis. Aortic valve mean gradient measures 3.0 mmHg. Aortic valve peak gradient measures 7.1 mmHg. Aortic valve area, by VTI measures 1.96 cm. Pulmonic Valve: The pulmonic valve was normal in structure. Pulmonic valve regurgitation is not visualized. Pulmonic regurgitation is not visualized. Aorta: The aortic root, ascending aorta and aortic arch are all structurally normal, with no evidence of dilitation or obstruction. Venous: The inferior vena cava is normal in size with greater than 50%  respiratory variability, suggesting right atrial pressure of 3 mmHg. IAS/Shunts: No atrial level shunt detected by color flow Doppler. There is no evidence of a patent foramen ovale. No ventricular septal defect is seen or detected. There is no evidence of an atrial septal defect.  LEFT VENTRICLE PLAX 2D LVIDd:         4.00 cm  Diastology LVIDs:         3.04 cm  LV e' lateral:   8.84 cm/s LV PW:         1.41 cm  LV E/e' lateral: 4.5 LV IVS:        1.47 cm  LV e' medial:    4.05 cm/s LVOT diam:     1.80 cm  LV E/e' medial:  9.9 LV SV:         34 ml LV SV Index:   18.48 LVOT Area:     2.54 cm  RIGHT VENTRICLE             IVC RV Basal diam:  2.27 cm     IVC diam: 1.80 cm RV S prime:     14.40 cm/s TAPSE (M-mode): 2.1 cm LEFT ATRIUM             Index       RIGHT ATRIUM           Index LA diam:        1.60 cm 0.90 cm/m  RA Area:     11.80 cm LA Vol (A2C):   46.1 ml 25.87 ml/m RA Volume:   23.90 ml  13.41 ml/m LA Vol (A4C):   30.6 ml 17.17 ml/m LA Biplane Vol: 39.3 ml 22.06 ml/m  AORTIC VALVE AV Area (Vmax):    1.71 cm AV Area (Vmean):   1.78 cm AV Area (VTI):     1.96 cm AV Vmax:           133.00 cm/s AV Vmean:          85.100 cm/s AV VTI:            0.221 m AV Peak Grad:      7.1 mmHg AV Mean Grad:      3.0 mmHg LVOT Vmax:         89.40 cm/s LVOT Vmean:        59.600 cm/s LVOT VTI:          0.170 m LVOT/AV VTI ratio: 0.77  AORTA Ao Root diam: 2.50 cm Ao Asc diam:  3.50 cm MITRAL VALVE                        TRICUSPID VALVE MV Area (PHT): 2.39 cm             TR Peak grad:  25.9 mmHg MV PHT:        91.93 msec           TR Vmax:        273.00 cm/s MV Decel Time: 317 msec MV E velocity: 39.90 cm/s 103 cm/s  SHUNTS MV A velocity: 61.80 cm/s 70.3 cm/s Systemic VTI:  0.17 m MV E/A ratio:  0.65       1.5       Systemic Diam: 1.80 cm  Skeet Latch MD Electronically signed by Skeet Latch MD Signature Date/Time: 11/24/2019/3:15:40 PM    Final    VAS US CAROTID  Result Date: 11/24/2019 Carotid Arterial  Duplex Study Indications:       TIA. Risk Factors:      Hypertension, hyperlipidemia, prior CVA. Comparison Study:  no prior Performing Technologist: Abram Sander RVS  Examination Guidelines: A complete evaluation includes B-mode imaging, spectral Doppler, color Doppler, and power Doppler as needed of all accessible portions of each vessel. Bilateral testing is considered an integral part of a complete examination. Limited examinations for reoccurring indications may be performed as noted.  Right Carotid Findings: +----------+--------+--------+--------+------------------+--------+           PSV cm/sEDV cm/sStenosisPlaque DescriptionComments +----------+--------+--------+--------+------------------+--------+ CCA Prox  88      10              heterogenous               +----------+--------+--------+--------+------------------+--------+ CCA Distal59      8               heterogenous               +----------+--------+--------+--------+------------------+--------+ ICA Prox  72      16      1-39%   heterogenous               +----------+--------+--------+--------+------------------+--------+ ICA Distal62      16                                         +----------+--------+--------+--------+------------------+--------+ ECA       76                                                 +----------+--------+--------+--------+------------------+--------+ +----------+--------+-------+--------+-------------------+           PSV cm/sEDV cmsDescribeArm Pressure (mmHG) +----------+--------+-------+--------+-------------------+ [id number]                                        +----------+--------+-------+--------+-------------------+ +---------+--------+--+--------+-+---------+ VertebralPSV cm/s73EDV cm/s9Antegrade +---------+--------+--+--------+-+---------+  Left Carotid Findings: +----------+--------+--------+--------+------------------+--------+           PSV  cm/sEDV cm/sStenosisPlaque DescriptionComments +----------+--------+--------+--------+------------------+--------+ CCA Prox  81      13              heterogenous               +----------+--------+--------+--------+------------------+--------+ CCA Distal70      12              heterogenous               +----------+--------+--------+--------+------------------+--------+ ICA Prox  62      11  1-39%   heterogenous               +----------+--------+--------+--------+------------------+--------+ ICA Distal77      20                                         +----------+--------+--------+--------+------------------+--------+ ECA       108     5                                          +----------+--------+--------+--------+------------------+--------+ +----------+--------+--------+--------+-------------------+           PSV cm/sEDV cm/sDescribeArm Pressure (mmHG) +----------+--------+--------+--------+-------------------+ [id number]                                         +----------+--------+--------+--------+-------------------+ +---------+--------+--+--------+-+---------+ VertebralPSV cm/s74EDV cm/s8Antegrade +---------+--------+--+--------+-+---------+   Summary: Right Carotid: Velocities in the right ICA are consistent with a 1-39% stenosis. Left Carotid: Velocities in the left ICA are consistent with a 1-39% stenosis. Vertebrals: Bilateral vertebral arteries demonstrate antegrade flow. *See table(s) above for measurements and observations.     Preliminary      Labs:   Basic Metabolic Panel: Recent Labs  Lab 11/23/19 1901 11/23/19 1928  NA 134* 133*  K 3.8 3.7  CL 99 98  CO2 22  --   GLUCOSE 136* 133*  BUN 15 15  CREATININE 0.65 0.60  CALCIUM 9.3  --    GFR Estimated Creatinine Clearance: 48.4 mL/min (by C-G formula based on SCr of 0.6 mg/dL). Liver Function Tests: Recent Labs  Lab 11/23/19 1901  AST 20  ALT 17  ALKPHOS 72    BILITOT 0.2*  PROT 6.5  ALBUMIN 3.9   No results for input(s): LIPASE, AMYLASE in the last 168 hours. No results for input(s): AMMONIA in the last 168 hours. Coagulation profile Recent Labs  Lab 11/23/19 1901  INR 1.0    CBC: Recent Labs  Lab 11/23/19 1901 11/23/19 1928  WBC 8.7  --   NEUTROABS 6.6  --   HGB 12.6 12.6  HCT 37.6 37.0  MCV 87.6  --   PLT 280  --    Cardiac Enzymes: No results for input(s): CKTOTAL, CKMB, CKMBINDEX, TROPONINI in the last 168 hours. BNP: Invalid input(s): POCBNP CBG: No results for input(s): GLUCAP in the last 168 hours. D-Dimer No results for input(s): DDIMER in the last 72 hours. Hgb A1c Recent Labs    11/24/19 0141  HGBA1C 5.5   Lipid Profile Recent Labs    11/24/19 0141  CHOL 164  HDL 48  LDLCALC 101*  TRIG 73  CHOLHDL 3.4   Thyroid function studies No results for input(s): TSH, T4TOTAL, T3FREE, THYROIDAB in the last 72 hours.  Invalid input(s): FREET3 Anemia work up No results for input(s): VITAMINB12, FOLATE, FERRITIN, TIBC, IRON, RETICCTPCT in the last 72 hours. Microbiology Recent Results (from the past 240 hour(s))  Urine culture     Status: Abnormal (Preliminary result)   Collection Time: 11/23/19  7:01 PM   Specimen: Urine, Random  Result Value Ref Range Status   Specimen Description URINE, RANDOM  Final   Special Requests NONE  Final   Culture (A)  Final    >=100,000 COLONIES/mL ESCHERICHIA  COLI SUSCEPTIBILITIES TO FOLLOW Performed at [facility], [place] [address] [place], [place] [address]    Report Status PENDING  Incomplete  SARS CORONAVIRUS 2 (TAT 6-24 HRS) Nasopharyngeal Nasopharyngeal Swab     Status: None   Collection Time: 11/23/19  9:12 PM   Specimen: Nasopharyngeal Swab  Result Value Ref Range Status   SARS Coronavirus 2 NEGATIVE NEGATIVE Final    Comment: (NOTE) SARS-CoV-2 target nucleic acids are NOT DETECTED. The SARS-CoV-2 RNA is generally detectable in upper and  lower respiratory specimens during the acute phase of infection. Negative results do not preclude SARS-CoV-2 infection, do not rule out co-infections with other pathogens, and should not be used as the sole basis for treatment or other patient management decisions. Negative results must be combined with clinical observations, patient history, and epidemiological information. The expected result is Negative. Fact Sheet for Patients: SugarRoll.be Fact Sheet for Healthcare Providers: https://www.woods-mathews.com/ This test is not yet approved or cleared by the [place] FDA and  has been authorized for detection and/or diagnosis of SARS-CoV-2 by FDA under an Emergency Use Authorization (EUA). This EUA will remain  in effect (meaning this test can be used) for the duration of the COVID-19 declaration under Section 56 4(b)(1) of the Act, 21 U.S.C. section 360bbb-3(b)(1), unless the authorization is terminated or revoked sooner. Performed at [facility], [place] [address], [place], [place] [address]      Discharge Instructions:   Discharge Instructions    Ambulatory referral to Neurology   Complete by: As directed    Follow up in stroke clinic at [facility] with Frann Rider, NP in about 4 weeks. If not available, consider Dr. Antony Contras, Dr. Bess Harvest, or Dr. Sarina Ill.   Diet - low sodium heart healthy   Complete by: As directed    Discharge instructions   Complete by: As directed    Resume home health ASA plus plavix for 3 weeks then ASA alone   Increase activity slowly   Complete by: As directed      Allergies as of 11/25/2019      Reactions   Codeine Nausea And Vomiting   Macrodantin [nitrofurantoin Macrocrystal] Nausea Only   "extreme nausea"   Crestor [rosuvastatin Calcium] Other (See Comments)   "aching in legs"   Doxycycline Itching, Rash   Hydrochlorothiazide Rash   "all over body  rash"   Norvasc [amlodipine Besylate] Swelling   "swelling in ankles"   Sulfa Antibiotics Nausea And Vomiting      Medication List    STOP taking these medications   amoxicillin-clavulanate 500-125 MG tablet Commonly known as: Augmentin   furosemide 20 MG tablet Commonly known as: LASIX   levETIRAcetam 500 MG tablet Commonly known as: KEPPRA     TAKE these medications   acetaminophen 325 MG tablet Commonly known as: TYLENOL Take 2 tablets (650 mg total) by mouth every 6 (six) hours as needed for mild pain (or Fever >/= 101).   anastrozole 1 MG tablet Commonly known as: ARIMIDEX Take 1 tablet (1 mg total) by mouth daily. What changed: when to take this   aspirin 81 MG EC tablet Take 1 tablet (81 mg total) by mouth daily. Start taking on: November 26, 2019   atorvastatin 20 MG tablet Commonly known as: LIPITOR Take 1 tablet (20 mg total) by mouth daily at 6 PM.   CALCIUM+D3 PO Take 1 tablet by mouth daily with lunch.   cloNIDine 0.3 MG tablet  Commonly known as: CATAPRES Take 0.3 mg by mouth at bedtime.   clopidogrel 75 MG tablet Commonly known as: PLAVIX Take 1 tablet (75 mg total) by mouth daily for 21 days. Start taking on: November 26, 2019   Cranberry 500 MG Caps Take 500 mg by mouth daily.   levothyroxine 100 MCG tablet Commonly known as: SYNTHROID Take 100 mcg by mouth daily.   losartan 100 MG tablet Commonly known as: COZAAR Take 100 mg by mouth daily.   metoprolol succinate 50 MG 24 hr tablet Commonly known as: TOPROL-XL Take 50 mg by mouth daily. Take with or immediately following a meal.   pantoprazole 40 MG tablet Commonly known as: PROTONIX Take 40 mg by mouth every evening.      Follow-up Information    [facility] Follow up on 12/08/2019.   Why: at 1030 for post hospital loop wound check  Contact information: [place]  [phone]       [facility] Follow up in 4 week(s).   Specialty: Neurology Why: stroke clinic. office will call with appt date and time Contact information: [place] [phone]           Time coordinating discharge: 35 min  Signed:  Geradine Girt DO  [facility] 11/25/2019, 3:21 PM

## 2019-11-25 NOTE — Plan of Care (Signed)
Mod assist with adls 

## 2019-11-25 NOTE — Progress Notes (Signed)
Physical Therapy Treatment Patient Details Name: Courtney Shea MRN: [id number] DOB: 1930-08-25 Today's Date: 11/25/2019    History of Present Illness Pt admitted with  a 20 minute episode of aphasia and R arm numbness.  MRI-. Pt had TIA 5 years ago.  Pt with h/o HTN and breast CA.  Pt feel in 12/2018 and broke L humerous.  Pt fell again in 08/2019 with resulting subdural hematoma.  Pt feels she has had a steady decline since November with her balance and independence.      PT Comments    Pt performed gt training and functional mobility during session, she was hesitant to mobilize and fearful of falling.  She progressed gt well and able to transfer with min assistance.  Continue to recommend HHPT.      Follow Up Recommendations  Home health PT;Supervision/Assistance - 24 hour     Equipment Recommendations  None recommended by PT    Recommendations for Other Services       Precautions / Restrictions Precautions Precautions: Fall;Other (comment) Precaution Comments: Watch HR Restrictions Weight Bearing Restrictions: No    Mobility  Bed Mobility Overal bed mobility: Needs Assistance Bed Mobility: Supine to Sit;Sit to Supine     Supine to sit: Min assist     General bed mobility comments: Assistance to elevate trunk into sitting and scoot to edge of bed.  Transfers Overall transfer level: Needs assistance Equipment used: Rolling walker (2 wheeled) Transfers: Sit to/from Stand Sit to Stand: Min assist         General transfer comment: Min assistance with cues for hand placement.  Mildly unsteady but no LOB.  Ambulation/Gait Ambulation/Gait assistance: Min guard Gait Distance (Feet): 80 Feet Assistive device: Rolling walker (2 wheeled) Gait Pattern/deviations: Step-through pattern;Decreased stride length;Trunk flexed;Shuffle Gait velocity: decreased   General Gait Details: Pt with slow, shuffling gait speed. Tendency for forward head posture and downward  gaze.   Stairs             Wheelchair Mobility    Modified Rankin (Stroke Patients Only)       Balance Overall balance assessment: Needs assistance Sitting-balance support: Feet supported Sitting balance-Leahy Scale: Fair     Standing balance support: Bilateral upper extremity supported;During functional activity Standing balance-Leahy Scale: Poor Standing balance comment: Relied heavily on outside support.                            Cognition Arousal/Alertness: Awake/alert Behavior During Therapy: WFL for tasks assessed/performed Overall Cognitive Status: Within Functional Limits for tasks assessed                                        Exercises      General Comments        Pertinent Vitals/Pain Pain Assessment: No/denies pain    Home Living                      Prior Function            PT Goals (current goals can now be found in the care plan section) Acute Rehab PT Goals Patient Stated Goal: to be able to return home. Potential to Achieve Goals: Fair Progress towards PT goals: Progressing toward goals    Frequency    Min 3X/week      PT Plan Current plan  remains appropriate    Co-evaluation              AM-PAC PT "6 Clicks" Mobility   Outcome Measure  Help needed turning from your back to your side while in a flat bed without using bedrails?: None Help needed moving from lying on your back to sitting on the side of a flat bed without using bedrails?: A Little Help needed moving to and from a bed to a chair (including a wheelchair)?: A Little Help needed standing up from a chair using your arms (e.g., wheelchair or bedside chair)?: A Little Help needed to walk in hospital room?: A Lot Help needed climbing 3-5 steps with a railing? : A Lot 6 Click Score: 17    End of Session Equipment Utilized During Treatment: Gait belt Activity Tolerance: Patient tolerated treatment well Patient left:  with call bell/phone within reach;in bed;with bed alarm set Nurse Communication: Mobility status PT Visit Diagnosis: Unsteadiness on feet (R26.81);Muscle weakness (generalized) (M62.81);History of falling (Z91.81);Difficulty in walking, not elsewhere classified (R26.2)     Time: [id number] PT Time Calculation (min) (ACUTE ONLY): 31 min  Charges:  $Gait Training: 8-22 mins $Therapeutic Activity: 8-22 mins                     Erasmo Leventhal , PTA Acute Rehabilitation Services Pager [phone] Office [phone]     Kemo Spruce Eli Hose 11/25/2019, 5:08 PM

## 2019-11-25 NOTE — Progress Notes (Signed)
Occupational Therapy Treatment Patient Details Name: Courtney Shea MRN: [id number] DOB: May 10, 1930 Today's Date: 11/25/2019    History of present illness Pt admitted with  a 20 minute episode of aphasia and R arm numbness.  MRI-. Pt had TIA 5 years ago.  Pt with h/o HTN and breast CA.  Pt feel in 12/2018 and broke L humerous.  Pt fell again in 08/2019 with resulting subdural hematoma.  Pt feels she has had a steady decline since November with her balance and independence.     OT comments  Pt progressing toward OT goals. Session focused on dressing and dynamic standing balance for d/c home. Pt completed LB dressing in seated position with min A for standing steadying assist. Pt needing to sit X2 to maintain balance. She is able to self cue for safety. She also completed UB dressing with set up assist and toilet transfer with min A for steadying. She remains to have shuffled functional mobility and at risk for falls. Education given on home safety, dtr present for education and in understanding. D/c recs remain appropriate.    Follow Up Recommendations  Home health OT;Supervision/Assistance - 24 hour    Equipment Recommendations  3 in 1 bedside commode    Recommendations for Other Services      Precautions / Restrictions Precautions Precautions: Fall;Other (comment) Precaution Comments: Watch HR Restrictions Weight Bearing Restrictions: No       Mobility Bed Mobility               General bed mobility comments: sitting up upon arrival  Transfers Overall transfer level: Needs assistance Equipment used: Rolling walker (2 wheeled) Transfers: Sit to/from Stand Sit to Stand: Min assist         General transfer comment: min A to steady    Balance Overall balance assessment: Needs assistance Sitting-balance support: Feet supported Sitting balance-Leahy Scale: Fair     Standing balance support: Bilateral upper extremity supported;During functional activity Standing  balance-Leahy Scale: Poor Standing balance comment: Relied heavily on outside support.                           ADL either performed or assessed with clinical judgement   ADL Overall ADL's : Needs assistance/impaired                 Upper Body Dressing : Set up;Sitting Upper Body Dressing Details (indicate cue type and reason): to don sweater Lower Body Dressing: Minimal assistance;Sit to/from stand Lower Body Dressing Details (indicate cue type and reason): to don pull on jeans; needing to sit x1 and get steadying assist in standing Toilet Transfer: Minimal assistance;Stand-pivot;BSC           Functional mobility during ADLs: Minimal assistance;Cueing for safety;Rolling walker General ADL Comments: session focused on dressing for d/c and dynamic standing balance and safety     Vision Patient Visual Report: No change from baseline     Perception     Praxis      Cognition Arousal/Alertness: Awake/alert Behavior During Therapy: WFL for tasks assessed/performed Overall Cognitive Status: Within Functional Limits for tasks assessed                                          Exercises     Shoulder Instructions       General Comments  Pertinent Vitals/ Pain       Pain Assessment: No/denies pain  Home Living                                          Prior Functioning/Environment              Frequency  Min 2X/week        Progress Toward Goals  OT Goals(current goals can now be found in the care plan section)  Progress towards OT goals: Progressing toward goals  Acute Rehab OT Goals Patient Stated Goal: to be able to return home. OT Goal Formulation: With patient Time For Goal Achievement: 12/08/19 Potential to Achieve Goals: Good  Plan Discharge plan remains appropriate    Co-evaluation                 AM-PAC OT "6 Clicks" Daily Activity     Outcome Measure   Help from another  person eating meals?: None Help from another person taking care of personal grooming?: None Help from another person toileting, which includes using toliet, bedpan, or urinal?: A Little Help from another person bathing (including washing, rinsing, drying)?: A Little Help from another person to put on and taking off regular upper body clothing?: A Little Help from another person to put on and taking off regular lower body clothing?: A Little 6 Click Score: 20    End of Session Equipment Utilized During Treatment: Rolling walker  OT Visit Diagnosis: Unsteadiness on feet (R26.81)   Activity Tolerance Patient limited by fatigue   Patient Left in bed;with call bell/phone within reach;with family/visitor present   Nurse Communication Mobility status        Time: [id number] OT Time Calculation (min): 10 min  Charges: OT General Charges $OT Visit: 1 Visit OT Treatments $Self Care/Home Management : 8-22 mins  Zenovia Jarred, MSOT, OTR/L Acute Rehabilitation Services [facility] Office Number: [phone]  Zenovia Jarred 11/25/2019, 4:50 PM

## 2019-11-25 NOTE — Discharge Instructions (Signed)

## 2019-11-26 LAB — URINE CULTURE: Culture: >=100000 — AB

## 2019-11-30 ENCOUNTER — Telehealth: Payer: Self-pay | Admitting: *Deleted

## 2019-11-30 NOTE — Telephone Encounter (Signed)
LINQ alert received for 4 "AF" episodes on 11/26/19. 3 available ECGs appear ST w/ectopy and occasional undersensing. Routed to Dr. Rayann Heman for confirmation.

## 2019-12-05 NOTE — Telephone Encounter (Signed)
Reviewed episodes with Dr. Rayann Heman in-office on 12/05/19. Confirmed episodes do not show true AF. Per Dr. Rayann Heman, will reprogram AF detection sensitivity to "less sensitive" and continue to monitor for additional episodes. Reprogramming sent remotely via [place].

## 2019-12-08 ENCOUNTER — Ambulatory Visit: Payer: Medicare Other

## 2019-12-10 ENCOUNTER — Ambulatory Visit: Payer: Medicare Other | Attending: Internal Medicine

## 2019-12-10 DIAGNOSIS — Z23 Encounter for immunization: Secondary | ICD-10-CM | POA: Insufficient documentation

## 2019-12-10 NOTE — Progress Notes (Signed)
   Covid-19 Vaccination Clinic  Name:  Courtney Shea    MRN: [id number] DOB: 1930-03-19  12/10/2019  Courtney Shea was observed post Covid-19 immunization for 15 minutes without incidence. She was provided with Vaccine Information Sheet and instruction to access the V-Safe system.   Courtney Shea was instructed to call 911 with any severe reactions post vaccine: Marland Kitchen Difficulty breathing  . Swelling of your face and throat  . A fast heartbeat  . A bad rash all over your body  . Dizziness and weakness    Immunizations Administered    Name Date Dose VIS Date Route   Pfizer COVID-19 Vaccine 12/10/2019  1:31 PM 0.3 mL 10/07/2019 Intramuscular   Manufacturer: [place]   Lot: [id number]   [place]: [id number]

## 2019-12-20 ENCOUNTER — Telehealth: Payer: Self-pay | Admitting: Adult Health

## 2019-12-20 NOTE — Telephone Encounter (Signed)
Patient called stating she would like to do her apt over the phone as she has difficulty getting in office

## 2019-12-26 NOTE — Telephone Encounter (Signed)
I spoke to pt and relayed that per JM/NP who has not seen you before.  She needs to be able to see you in office or in VV.  She states that her other MD have done telephone visits.  (I relayed they have see her before and she stated yes).  She will check with daughter and husband about coming to office and using our wheelchair or VV if daughter is able to do this with pt.  I will leave appt as is for now and pt will get back to [place] if needed prior to this.

## 2019-12-26 NOTE — Telephone Encounter (Signed)
Unable to do a new patient appointment via phone call.  Is she able to do a virtual visit?  If not, request rescheduling at a later date for office appointment when she is able to obtain transportation

## 2019-12-30 ENCOUNTER — Ambulatory Visit (INDEPENDENT_AMBULATORY_CARE_PROVIDER_SITE_OTHER): Payer: Medicare Other | Admitting: *Deleted

## 2019-12-30 DIAGNOSIS — Z950 Presence of cardiac pacemaker: Secondary | ICD-10-CM | POA: Diagnosis not present

## 2019-12-30 LAB — CUP PACEART REMOTE DEVICE CHECK
Date Time Interrogation Session: 20210305102346
Implantable Pulse Generator Implant Date: 20210129

## 2019-12-30 NOTE — Progress Notes (Signed)
ILR Remote 

## 2020-01-05 ENCOUNTER — Inpatient Hospital Stay: Payer: Medicare Other | Admitting: Adult Health

## 2020-01-30 ENCOUNTER — Ambulatory Visit (INDEPENDENT_AMBULATORY_CARE_PROVIDER_SITE_OTHER): Payer: Medicare Other | Admitting: *Deleted

## 2020-01-30 DIAGNOSIS — Z950 Presence of cardiac pacemaker: Secondary | ICD-10-CM | POA: Diagnosis not present

## 2020-01-30 LAB — CUP PACEART REMOTE DEVICE CHECK
Date Time Interrogation Session: 20210405102705
Implantable Pulse Generator Implant Date: 20210129

## 2020-01-31 NOTE — Progress Notes (Signed)
ILR Remote 

## 2020-03-01 LAB — CUP PACEART REMOTE DEVICE CHECK
Date Time Interrogation Session: 20210506102511
Implantable Pulse Generator Implant Date: 20210129

## 2020-03-05 ENCOUNTER — Ambulatory Visit (INDEPENDENT_AMBULATORY_CARE_PROVIDER_SITE_OTHER): Payer: Medicare Other | Admitting: *Deleted

## 2020-03-05 DIAGNOSIS — G459 Transient cerebral ischemic attack, unspecified: Secondary | ICD-10-CM | POA: Diagnosis not present

## 2020-03-06 NOTE — Progress Notes (Signed)
Carelink Summary Report / Loop Recorder 

## 2020-04-09 ENCOUNTER — Ambulatory Visit (INDEPENDENT_AMBULATORY_CARE_PROVIDER_SITE_OTHER): Payer: Medicare Other | Admitting: *Deleted

## 2020-04-09 DIAGNOSIS — G459 Transient cerebral ischemic attack, unspecified: Secondary | ICD-10-CM | POA: Diagnosis not present

## 2020-04-09 LAB — CUP PACEART REMOTE DEVICE CHECK
Date Time Interrogation Session: 20210613230510
Implantable Pulse Generator Implant Date: 20210129

## 2020-04-11 NOTE — Progress Notes (Signed)
Carelink Summary Report / Loop Recorder 

## 2020-05-14 ENCOUNTER — Ambulatory Visit (INDEPENDENT_AMBULATORY_CARE_PROVIDER_SITE_OTHER): Payer: Medicare Other | Admitting: *Deleted

## 2020-05-14 DIAGNOSIS — G459 Transient cerebral ischemic attack, unspecified: Secondary | ICD-10-CM

## 2020-05-14 LAB — CUP PACEART REMOTE DEVICE CHECK
Date Time Interrogation Session: 20210718230510
Implantable Pulse Generator Implant Date: 20210129

## 2020-05-15 NOTE — Progress Notes (Signed)
Carelink Summary Report / Loop Recorder 

## 2020-06-18 ENCOUNTER — Ambulatory Visit (INDEPENDENT_AMBULATORY_CARE_PROVIDER_SITE_OTHER): Payer: Medicare Other | Admitting: *Deleted

## 2020-06-18 DIAGNOSIS — G459 Transient cerebral ischemic attack, unspecified: Secondary | ICD-10-CM | POA: Diagnosis not present

## 2020-06-18 LAB — CUP PACEART REMOTE DEVICE CHECK
Date Time Interrogation Session: 20210820230042
Implantable Pulse Generator Implant Date: 20210129

## 2020-06-25 NOTE — Progress Notes (Signed)
Carelink Summary Report / Loop Recorder 

## 2020-07-22 LAB — CUP PACEART REMOTE DEVICE CHECK
Date Time Interrogation Session: 20210922230520
Implantable Pulse Generator Implant Date: 20210129

## 2020-07-23 ENCOUNTER — Ambulatory Visit (INDEPENDENT_AMBULATORY_CARE_PROVIDER_SITE_OTHER): Payer: Medicare Other | Admitting: Emergency Medicine

## 2020-07-23 DIAGNOSIS — G459 Transient cerebral ischemic attack, unspecified: Secondary | ICD-10-CM

## 2020-07-26 NOTE — Progress Notes (Signed)
Carelink Summary Report / Loop Recorder 

## 2020-07-31 ENCOUNTER — Telehealth: Payer: Self-pay

## 2020-07-31 NOTE — Telephone Encounter (Signed)
Attempted to call patient back.  Unable to leave VM due to VM box is full.

## 2020-07-31 NOTE — Telephone Encounter (Signed)
The pt states she do not want to pay $10 every month. She wants to discontinue being monitor. She wants to have it removed. She states she never had a heart problem. I told her I will let the Doctor know and the nurse will give her a call back at [phone].

## 2020-08-01 NOTE — Telephone Encounter (Signed)
Discussed with patient importance of ILR and why she needed to be monitored. Patient states she was unsure why she had the device. Explained to patient why she has device and risk of not monitoring. Patient states she will continue monitoring monthly. Advised patient we will contact her if we see anything on the device triggered. Verbalized understanding. Advised patient to call back with any questions or concerns she may have.

## 2020-08-07 ENCOUNTER — Telehealth: Payer: Self-pay | Admitting: Oncology

## 2020-08-07 NOTE — Telephone Encounter (Signed)
Per sch msg, patient wants to cancel 10/14 appt. Called and spoke with patient. She will call back to let us know when she can come in. Cancelled appt per patient request

## 2020-08-09 ENCOUNTER — Other Ambulatory Visit: Payer: Medicare Other

## 2020-08-09 ENCOUNTER — Ambulatory Visit: Payer: Medicare Other | Admitting: Oncology

## 2020-08-25 LAB — CUP PACEART REMOTE DEVICE CHECK
Date Time Interrogation Session: 20211025230529
Implantable Pulse Generator Implant Date: 20210129

## 2020-08-27 ENCOUNTER — Ambulatory Visit (INDEPENDENT_AMBULATORY_CARE_PROVIDER_SITE_OTHER): Payer: Medicare Other

## 2020-08-27 DIAGNOSIS — G459 Transient cerebral ischemic attack, unspecified: Secondary | ICD-10-CM | POA: Diagnosis not present

## 2020-08-30 NOTE — Progress Notes (Signed)
Carelink Summary Report / Loop Recorder 

## 2020-09-14 ENCOUNTER — Telehealth: Payer: Self-pay | Admitting: *Deleted

## 2020-09-14 NOTE — Telephone Encounter (Signed)
VM left by [facility] at Dr Inda Merlin' requesting confirmation that the patient has completed 5 years of the Anastrozole. Call returned by this RN and obatined Kaitlyn's VM- informed her pt would be completing 5 years of therapy and per last dictation 07/2019 pt would graduate this year.  Noted pt canceled appointment scheduled for 08/09/2020.  This RN called and spoke with Tamela Oddi per above- she states she is finishing her last bottle of anastrozole now and has requested for medication not to be refilled.  This RN verified above as correct as well as discussed canceled appointment by her for Oct 2021.  Dinisha states she has difficulty getting to appointments due to current physical status.  This RN informed her above would be reviewed with Dr Jana Hakim and he may do a phone visit if needed.  Pt is in agreement to above.

## 2020-09-17 ENCOUNTER — Other Ambulatory Visit: Payer: Self-pay | Admitting: Oncology

## 2020-09-17 ENCOUNTER — Encounter: Payer: Self-pay | Admitting: Oncology

## 2020-09-17 NOTE — Progress Notes (Signed)
  Patient did not show for her October appointment.  She is very limited in transportation at present.  In any case that was going to be her graduation visit.  I have sent her a letter letting her know that.  Please refer to the letter for details.

## 2020-09-29 LAB — CUP PACEART REMOTE DEVICE CHECK
Date Time Interrogation Session: 20211127230400
Implantable Pulse Generator Implant Date: 20210129

## 2020-10-01 ENCOUNTER — Ambulatory Visit (INDEPENDENT_AMBULATORY_CARE_PROVIDER_SITE_OTHER): Payer: Medicare Other

## 2020-10-01 DIAGNOSIS — G459 Transient cerebral ischemic attack, unspecified: Secondary | ICD-10-CM

## 2020-10-11 NOTE — Progress Notes (Signed)
Carelink Summary Report / Loop Recorder 

## 2020-11-05 ENCOUNTER — Ambulatory Visit (INDEPENDENT_AMBULATORY_CARE_PROVIDER_SITE_OTHER): Payer: Medicare Other

## 2020-11-05 DIAGNOSIS — G459 Transient cerebral ischemic attack, unspecified: Secondary | ICD-10-CM | POA: Diagnosis not present

## 2020-11-05 LAB — CUP PACEART REMOTE DEVICE CHECK
Date Time Interrogation Session: 20220108230359
Implantable Pulse Generator Implant Date: 20210129

## 2020-11-08 ENCOUNTER — Telehealth: Payer: Self-pay | Admitting: Emergency Medicine

## 2020-11-08 NOTE — Telephone Encounter (Addendum)
Alert received for possible new onset of A-fib. Transmission printed and shown to Dr. Quentin Ore in clinic 11/08/2020 at 13:30 p.m. Dr. Quentin Ore confirmed new onset of A-fib. Patient currently not taking any anticoagulants. Dr. Quentin Ore advised to set up an appointment to refer patient to the A-fib clinic to be placed on anticoagulant medication.

## 2020-11-08 NOTE — Telephone Encounter (Signed)
Called and spoke to patient. Advised patient that AF was noted on remote transmission and Dr. Quentin Ore would like for her to follow up with the AF Clinic to discuss the possibility to starting [facility]. Patient reports she has felt fine and was not aware of any recent arrhythmias. Answered questions to the best of my ability and advised to call with further questions or concerns. Verbalized understanding.

## 2020-11-09 NOTE — Telephone Encounter (Signed)
Called patient and she stated she is disabled and does not drive.  Patient states she will call back after she talks with people that take her to appts.  Patient provided our phone number to call back.

## 2020-11-19 NOTE — Progress Notes (Signed)
Carelink Summary Report / Loop Recorder 

## 2020-11-23 NOTE — Telephone Encounter (Signed)
Called and spoke with patient about a virtual/phone visit which she is agreeable to.  She is aware of appt 11/27/2020 at 11:30 am with Adline Peals, PA.

## 2020-11-27 ENCOUNTER — Ambulatory Visit (HOSPITAL_COMMUNITY)
Admission: RE | Admit: 2020-11-27 | Discharge: 2020-11-27 | Disposition: A | Discharge status: Home / Self Care | Payer: Medicare Other | Source: Ambulatory Visit | Attending: Physician Assistant | Admitting: Physician Assistant

## 2020-11-27 ENCOUNTER — Encounter (HOSPITAL_COMMUNITY): Payer: Self-pay | Admitting: Physician Assistant

## 2020-11-27 ENCOUNTER — Other Ambulatory Visit: Payer: Self-pay

## 2020-11-27 VITALS — BP 138/74 | Wt 163.0 lb

## 2020-11-27 DIAGNOSIS — D6869 Other thrombophilia: Secondary | ICD-10-CM | POA: Insufficient documentation

## 2020-11-27 DIAGNOSIS — I480 Paroxysmal atrial fibrillation: Secondary | ICD-10-CM

## 2020-11-27 MED ORDER — APIXABAN 5 MG PO TABS: 5.0000 mg | ORAL_TABLET | Freq: Two times a day (BID) | ORAL | 3 refills | Status: DC

## 2020-11-27 NOTE — Progress Notes (Signed)
Electrophysiology TeleHealth Note   Audio telehealth visit is felt to be most appropriate for this patient at this time.  See consent below from today for patient consent regarding telehealth for the At[facility] F[facility]illation Clinic.    Date:  11/27/2020   ID:  Courtney Shea, DOB Nov 28, 1929, MRN [id number]  Location: home  Provider location: [address] [place], [place] [address] Evaluation Performed: Follow up  PCP:  Josetta Huddle, MD  Primary Electrophysiologist: Dr Rayann Heman    History of Present Illness: Courtney Shea is a 87 y.o. female with a history of breast cancer, HTN, hypothyroidism, TIA, atrial fibrillation who presents via audio conferencing for a telehealth visit today. She was hospitalized 10/2019 with an acute TIA and a ILR was placed by Dr Rayann Heman at that time. The device clinic received an alert for two afib episodes on 11/03/20 which lasted 14 and 8 minutes. Patient was unaware of any arrhythmia at that time. She denies any significant snoring or alcohol use.   Today, she denies symptoms of palpitations, chest pain, shortness of breath, orthopnea, PND, lower extremity edema, claudication, dizziness, presyncope, syncope, bleeding, or neurologic sequela. The patient is tolerating medications without difficulties and is otherwise without complaint today.    Atrial Fibrillation Risk Factors:  she does not have symptoms or diagnosis of sleep apnea. she does not have a history of rheumatic fever. she does not have a history of alcohol use. The patient does not have a history of early familial atrial fibrillation or other arrhythmias.  she has a BMI of Body mass index is 27.98 kg/m.Marland Kitchen Filed Weights   11/27/20 1155  Weight: 73.9 kg     Atrial Fibrillation Management history:  Previous antiarrhythmic drugs: none Previous cardioversions: none Previous ablations: none CHADS2VASC score: 6 Anticoagulation history: none   Past Medical History:  Diagnosis Date  .  Breast cancer ([place])   . Breast cancer of lower-outer quadrant of right female breast ([place]) 05/07/2015  . GERD (gastroesophageal reflux disease)   . Hypertension   . Hypothyroid   . Personal history of radiation therapy   . Stroke [facility])    TIA, no deficits, placed on Plavix  . Syncope   . Ventricular bigeminy    Past Surgical History:  Procedure Laterality Date  . ABDOMINAL HYSTERECTOMY    . BLADDER SUSPENSION    . BREAST LUMPECTOMY Right    2016  . CATARACT EXTRACTION    . INCONTINENCE SURGERY    . LOOP RECORDER INSERTION N/A 11/25/2019   Procedure: LOOP RECORDER INSERTION;  Surgeon: Thompson Grayer, MD;  Location: [place] [facility];  Service: Cardiovascular;  Laterality: N/A;  . Ovarian cyst removed    . PARTIAL MASTECTOMY WITH NEEDLE LOCALIZATION Bilateral 05/29/2015   Procedure: LEFT BREAST PARTIAL MASTECTOMY WITH NEEDLE LOCALIZATION TIMES 2 AND RIGHT BREAST PARTIAL MASTECTOMY WITH NEEDLE LOCALIZATION;  Surgeon: Erroll Luna, MD;  Location: [place];  Service: General;  Laterality: Bilateral;  . RE-EXCISION OF BREAST LUMPECTOMY Left 06/21/2015   Procedure: LEFT BREAST RE EXCISION OF LUMPECTOMY;  Surgeon: Erroll Luna, MD;  Location: [place];  Service: General;  Laterality: Left;     Current Outpatient Medications  Medication Sig Dispense Refill  . acetaminophen (TYLENOL) 325 MG tablet Take 2 tablets (650 mg total) by mouth every 6 (six) hours as needed for mild pain (or Fever >/= 101).    Marland Kitchen amLODipine (NORVASC) 2.5 MG tablet Take 2.5 mg by mouth every morning.    Marland Kitchen  apixaban (ELIQUIS) 5 MG TABS tablet Take 1 tablet (5 mg total) by mouth 2 (two) times daily. 60 tablet 3  . atorvastatin (LIPITOR) 20 MG tablet Take 1 tablet (20 mg total) by mouth daily at 6 PM. 30 tablet 0  . Calcium Carb-Cholecalciferol (CALCIUM+D3 PO) Take 1 tablet by mouth daily with lunch.    . clobetasol (TEMOVATE) 0.05 % external solution Apply topically.    . cloNIDine  (CATAPRES) 0.3 MG tablet Take 0.3 mg by mouth at bedtime.    . Cranberry 500 MG CAPS Take 500 mg by mouth daily.    . furosemide (LASIX) 20 MG tablet Take 20 mg by mouth daily as needed.    Marland Kitchen levothyroxine (SYNTHROID) 100 MCG tablet Take 100 mcg by mouth daily.     Marland Kitchen losartan (COZAAR) 100 MG tablet Take 100 mg by mouth daily.    . metoprolol succinate (TOPROL-XL) 50 MG 24 hr tablet Take 50 mg by mouth daily. Take with or immediately following a meal.    . pantoprazole (PROTONIX) 40 MG tablet Take 40 mg by mouth every evening.    . sertraline (ZOLOFT) 25 MG tablet Take 25 mg by mouth every morning.     No current facility-administered medications for this encounter.    Allergies:   Codeine, Macrodantin [nitrofurantoin macrocrystal], Crestor [rosuvastatin calcium], Doxycycline, Hydrochlorothiazide, Norvasc [amlodipine besylate], and Sulfa antibiotics   Social History:  The patient  reports that she has never smoked. She has never used smokeless tobacco. She reports that she does not drink alcohol and does not use drugs.   Family History:  The patient's  family history includes Breast cancer (age of onset: 60) in her sister; Cancer in her sister; Coronary artery disease in her mother; Kidney disease in her brother.    ROS:  Please see the history of present illness.   All other systems are personally reviewed and negative.    Recent Labs: No results found for requested labs within last 8760 hours.  personally reviewed    Echo 11/24/19 demonstrates 1. Left ventricular ejection fraction, by visual estimation, is 55 to  60%. The left ventricle has normal function. Left ventricular septal wall  thickness was moderately increased. Moderately increased left ventricular posterior wall thickness. There is moderately increased left ventricular hypertrophy.  2. Definity contrast agent was given IV to delineate the left ventricular  endocardial borders.  3. Left ventricular diastolic parameters  are consistent with Grade I  diastolic dysfunction (impaired relaxation).  4. The left ventricle has no regional wall motion abnormalities.  5. Global right ventricle has normal systolic function.The right  ventricular size is normal. No increase in right ventricular wall  thickness.  6. Left atrial size was normal.  7. Right atrial size was normal.  8. The mitral valve is normal in structure. Trivial mitral valve  regurgitation. No evidence of mitral stenosis.  9. The tricuspid valve is normal in structure.  10. The tricuspid valve is normal in structure. Tricuspid valve  regurgitation is mild.  11. The aortic valve is tricuspid. Aortic valve regurgitation is not  visualized. No evidence of aortic valve sclerosis or stenosis.  12. The pulmonic valve was normal in structure. Pulmonic valve  regurgitation is not visualized.  13. Normal pulmonary artery systolic pressure.  14. The inferior vena cava is normal in size with greater than 50%  respiratory variability, suggesting right atrial pressure of 3 mmHg.    CHA2DS2-VASc Score = 6  The patient's score is based upon:  CHF History: No HTN History: Yes Diabetes History: No Stroke History: Yes Vascular Disease History: No Age Score: 2 Gender Score: 1      ASSESSMENT AND PLAN: 1. Paroxysmal Atrial Fibrillation (ICD10:  I48.0) The patient's CHA2DS2-VASc score is 6, indicating a 9.7% annual risk of stroke.   General education about afib provided and questions answered. We also discussed her stroke risk and the risks and benefits of anticoagulation. Stop ASA and Plavix. Start Eliquis 5 mg BID Plan to check bmet/CBC on follow up. Continue Toprol 50 mg daily  2. Secondary Hypercoagulable State (ICD10:  D68.69) The patient is at significant risk for stroke/thromboembolism based upon her CHA2DS2-VASc Score of 6.  Start Apixaban (Eliquis).   3. HTN Stable, no changes today.   Follow-up in the AF clinic in one month.    Current medicines are reviewed at length with the patient today.   The patient does not have concerns regarding her medicines.  The following changes were made today:  Stop ASA and Plavix. Start Eliquis  Labs/ tests ordered today include:  No orders of the defined types were placed in this encounter.   Patient Risk:  after full review of this patients clinical status, I feel that they are at moderate risk at this time.   Today, I have spent 17 minutes with the patient with telehealth technology discussing the above.    Gwenlyn Perking PA-C 11/27/2020 1:04 PM  [facility] [address] [place], [place] [address] [phone]   I hereby voluntarily request, consent and authorize the [facility] and its employed or contracted physicians, physician assistants, nurse practitioners or other licensed health care professionals (the Practitioner), to provide me with telemedicine health care services (the "Services") as deemed necessary by the treating Practitioner. I acknowledge and consent to receive the Services by the Practitioner via telemedicine. I understand that the telemedicine visit will involve communicating with the Practitioner through live audiovisual communication technology and the disclosure of certain medical information by electronic transmission. I acknowledge that I have been given the opportunity to request an in-person assessment or other available alternative prior to the telemedicine visit and am voluntarily participating in the telemedicine visit.   I understand that I have the right to withhold or withdraw my consent to the use of telemedicine in the course of my care at any time, without affecting my right to future care or treatment, and that the Practitioner or I may terminate the telemedicine visit at any time. I understand that I have the right to inspect all information obtained and/or recorded in the course of the  telemedicine visit and may receive copies of available information for a reasonable fee.  I understand that some of the potential risks of receiving the Services via telemedicine include:   Delay or interruption in medical evaluation due to technological equipment failure or disruption;  Information transmitted may not be sufficient (e.g. poor resolution of images) to allow for appropriate medical decision making by the Practitioner; and/or  In rare instances, security protocols could fail, causing a breach of personal health information.   Furthermore, I acknowledge that it is my responsibility to provide information about my medical history, conditions and care that is complete and accurate to the best of my ability. I acknowledge that Practitioner's advice, recommendations, and/or decision may be based on factors not within their control, such as incomplete or inaccurate data provided by me or distortions of diagnostic images or specimens that may result  from electronic transmissions. I understand that the practice of medicine is not an exact science and that Practitioner makes no warranties or guarantees regarding treatment outcomes. I acknowledge that I will receive a copy of this consent concurrently upon execution via email to the email address I last provided but may also request a printed copy by calling the office of the [facility].  I understand that my insurance will be billed for this visit.   I have read or had this consent read to me.  I understand the contents of this consent, which adequately explains the benefits and risks of the Services being provided via telemedicine.  I have been provided ample opportunity to ask questions regarding this consent and the Services and have had my questions answered to my satisfaction.  I give my informed consent for the services to be provided through the use of telemedicine in my medical care  By participating in this telemedicine  visit I agree to the above.

## 2020-11-30 ENCOUNTER — Telehealth (HOSPITAL_COMMUNITY): Payer: Self-pay | Admitting: *Deleted

## 2020-11-30 NOTE — Telephone Encounter (Signed)
PCP office called stating pt called their office to report Eliquis was going to cost her $125 and she cannot afford this. Will discuss options with Malka So PA

## 2020-12-04 NOTE — Telephone Encounter (Signed)
Talked with [facility] - for a 90 day supply eliquis is $249 other option is coumadin but pt has difficulty with transportation for INR checks. Pt prefers to use mail order for eliquis as she does not want to use coumadin. [facility]

## 2020-12-08 LAB — CUP PACEART REMOTE DEVICE CHECK
Date Time Interrogation Session: 20220210230410
Implantable Pulse Generator Implant Date: 20210129

## 2020-12-10 ENCOUNTER — Ambulatory Visit (INDEPENDENT_AMBULATORY_CARE_PROVIDER_SITE_OTHER): Payer: Medicare Other

## 2020-12-10 DIAGNOSIS — G459 Transient cerebral ischemic attack, unspecified: Secondary | ICD-10-CM

## 2020-12-13 NOTE — Progress Notes (Signed)
Carelink Summary Report / Loop Recorder 

## 2020-12-27 ENCOUNTER — Ambulatory Visit (HOSPITAL_COMMUNITY): Payer: Medicare Other | Admitting: Physician Assistant

## 2021-01-02 NOTE — Progress Notes (Signed)
Primary Care Physician: Josetta Huddle, MD Primary Electrophysiologist: Dr Rayann Heman Referring Physician: Dr Allred/device clinic   Courtney Shea is a 87 y.o. female with a history of breast cancer, HTN, hypothyroidism, TIA, atrial fibrillation who presents for follow up in the [facility]. She was hospitalized 10/2019 with an acute TIA and a ILR was placed by Dr Rayann Heman at that time. The device clinic received an alert for two afib episodes on 11/03/20 which lasted 14 and 8 minutes. Patient was unaware of any arrhythmia at that time. She denies any significant snoring or alcohol use. Patient is on Eliquis for a CHADS2VASC score of 6.  On follow up today, patient reports that she has done well since her last visit. She denies any heart racing or palpitations. She denies any bleeding issues on anticoagulation.   Today, she denies symptoms of palpitations, chest pain, shortness of breath, orthopnea, PND, lower extremity edema, dizziness, presyncope, syncope, snoring, daytime somnolence, bleeding, or neurologic sequela. The patient is tolerating medications without difficulties and is otherwise without complaint today.    Atrial Fibrillation Risk Factors:  she does not have symptoms or diagnosis of sleep apnea. she does not have a history of rheumatic fever. she does not have a history of alcohol use. The patient does not have a history of early familial atrial fibrillation or other arrhythmias.  she has a BMI of Body mass index is 29.11 kg/m.Marland Kitchen Filed Weights   01/03/21 1133  Weight: 76.9 kg    Family History  Problem Relation Age of Onset  . Coronary artery disease Mother        MI at age 27  . Breast cancer Sister 41  . Kidney disease Brother   . Cancer Sister        colon cancer suggested     Atrial Fibrillation Management history:  Previous antiarrhythmic drugs: none Previous cardioversions: none Previous ablations: none CHADS2VASC score:  6 Anticoagulation history: Eliquis   Past Medical History:  Diagnosis Date  . Breast cancer ([place])   . Breast cancer of lower-outer quadrant of right female breast ([place]) 05/07/2015  . GERD (gastroesophageal reflux disease)   . Hypertension   . Hypothyroid   . Personal history of radiation therapy   . Stroke [facility])    TIA, no deficits, placed on Plavix  . Syncope   . Ventricular bigeminy    Past Surgical History:  Procedure Laterality Date  . ABDOMINAL HYSTERECTOMY    . BLADDER SUSPENSION    . BREAST LUMPECTOMY Right    2016  . CATARACT EXTRACTION    . INCONTINENCE SURGERY    . LOOP RECORDER INSERTION N/A 11/25/2019   Procedure: LOOP RECORDER INSERTION;  Surgeon: Thompson Grayer, MD;  Location: [place] [facility];  Service: Cardiovascular;  Laterality: N/A;  . Ovarian cyst removed    . PARTIAL MASTECTOMY WITH NEEDLE LOCALIZATION Bilateral 05/29/2015   Procedure: LEFT BREAST PARTIAL MASTECTOMY WITH NEEDLE LOCALIZATION TIMES 2 AND RIGHT BREAST PARTIAL MASTECTOMY WITH NEEDLE LOCALIZATION;  Surgeon: Erroll Luna, MD;  Location: [place];  Service: General;  Laterality: Bilateral;  . RE-EXCISION OF BREAST LUMPECTOMY Left 06/21/2015   Procedure: LEFT BREAST RE EXCISION OF LUMPECTOMY;  Surgeon: Erroll Luna, MD;  Location: [place];  Service: General;  Laterality: Left;    Current Outpatient Medications  Medication Sig Dispense Refill  . acetaminophen (TYLENOL) 325 MG tablet Take 2 tablets (650 mg total) by mouth every 6 (six) hours as  needed for mild pain (or Fever >/= 101).    Marland Kitchen amLODipine (NORVASC) 2.5 MG tablet Take 2.5 mg by mouth every morning.    Marland Kitchen apixaban (ELIQUIS) 5 MG TABS tablet Take 1 tablet (5 mg total) by mouth 2 (two) times daily. 60 tablet 3  . atorvastatin (LIPITOR) 20 MG tablet Take 1 tablet (20 mg total) by mouth daily at 6 PM. 30 tablet 0  . Calcium Carb-Cholecalciferol (CALCIUM+D3 PO) Take 1 tablet by mouth daily with lunch.    .  cetirizine (ZYRTEC ALLERGY) 10 MG tablet Take 10 mg by mouth daily.    . clobetasol (TEMOVATE) 0.05 % external solution Apply topically.    . cloNIDine (CATAPRES) 0.3 MG tablet Take 0.3 mg by mouth at bedtime.    . Cranberry 500 MG CAPS Take 500 mg by mouth daily.    . furosemide (LASIX) 20 MG tablet Take 20 mg by mouth daily as needed.    Marland Kitchen levothyroxine (SYNTHROID) 100 MCG tablet Take 100 mcg by mouth daily.     Marland Kitchen losartan (COZAAR) 100 MG tablet Take 100 mg by mouth daily.    . metoprolol succinate (TOPROL-XL) 50 MG 24 hr tablet Take 50 mg by mouth daily. Take with or immediately following a meal.    . pantoprazole (PROTONIX) 40 MG tablet Take 40 mg by mouth every evening.    . sertraline (ZOLOFT) 25 MG tablet Take 25 mg by mouth every morning.     No current facility-administered medications for this encounter.    Allergies  Allergen Reactions  . Codeine Nausea And Vomiting  . Macrodantin [Nitrofurantoin Macrocrystal] Nausea Only    "extreme nausea"  . Crestor [Rosuvastatin Calcium] Other (See Comments)    "aching in legs"  . Doxycycline Itching and Rash  . Hydrochlorothiazide Rash    "all over body rash"  . Norvasc [Amlodipine Besylate] Swelling    "swelling in ankles"  . Sulfa Antibiotics Nausea And Vomiting    Social History   Socioeconomic History  . Marital status: Married    Spouse name: Not on file  . Number of children: Not on file  . Years of education: Not on file  . Highest education level: Not on file  Occupational History  . Not on file  Tobacco Use  . Smoking status: Never Smoker  . Smokeless tobacco: Never Used  Substance and Sexual Activity  . Alcohol use: No  . Drug use: No  . Sexual activity: Never    Birth control/protection: Post-menopausal  Other Topics Concern  . Not on file  Social History Narrative  . Not on file   Social Determinants of Health   Financial Resource Strain: Not on file  Food Insecurity: Not on file  Transportation  Needs: Not on file  Physical Activity: Not on file  Stress: Not on file  Social Connections: Not on file  Intimate Partner Violence: Not on file     ROS- All systems are reviewed and negative except as per the HPI above.  Physical Exam: Vitals:   01/03/21 1133  BP: 140/80  Pulse: 60  Weight: 76.9 kg  Height: 5\' 4"  (1.626 m)    GEN- The patient is a well appearing elderly female, alert and oriented x 3 today.   HEENT-head normocephalic, atraumatic, sclera clear, conjunctiva pink, hearing intact, trachea midline. Lungs- Clear to ausculation bilaterally, normal work of breathing Heart- Regular rate and rhythm, no murmurs, rubs or gallops  GI- soft, NT, ND, + BS Extremities- no clubbing,  cyanosis, or edema MS- no significant deformity or atrophy Skin- no rash or lesion Psych- euthymic mood, full affect Neuro- strength and sensation are intact   Wt Readings from Last 3 Encounters:  01/03/21 76.9 kg  11/27/20 73.9 kg  11/23/19 72.8 kg    EKG today demonstrates  SR Vent. rate 60 BPM PR interval 168 ms QRS duration 76 ms QT/QTc 404/404 ms  Echo 11/24/19 demonstrated  1. Left ventricular ejection fraction, by visual estimation, is 55 to  60%. The left ventricle has normal function. Left ventricular septal wall  thickness was moderately increased. Moderately increased left ventricular  posterior wall thickness. There is  moderately increased left ventricular hypertrophy.  2. Definity contrast agent was given IV to delineate the left ventricular  endocardial borders.  3. Left ventricular diastolic parameters are consistent with Grade I  diastolic dysfunction (impaired relaxation).  4. The left ventricle has no regional wall motion abnormalities.  5. Global right ventricle has normal systolic function.The right  ventricular size is normal. No increase in right ventricular wall  thickness.  6. Left atrial size was normal.  7. Right atrial size was normal.  8. The  mitral valve is normal in structure. Trivial mitral valve  regurgitation. No evidence of mitral stenosis.  9. The tricuspid valve is normal in structure.  10. The tricuspid valve is normal in structure. Tricuspid valve  regurgitation is mild.  11. The aortic valve is tricuspid. Aortic valve regurgitation is not  visualized. No evidence of aortic valve sclerosis or stenosis.  12. The pulmonic valve was normal in structure. Pulmonic valve  regurgitation is not visualized.  13. Normal pulmonary artery systolic pressure.  14. The inferior vena cava is normal in size with greater than 50%  respiratory variability, suggesting right atrial pressure of 3 mmHg.   Epic records are reviewed at length today  CHA2DS2-VASc Score = 6  The patient's score is based upon: CHF History: No HTN History: Yes Diabetes History: No Stroke History: Yes Vascular Disease History: No Age Score: 2 Gender Score: 1      ASSESSMENT AND PLAN: 1. Paroxysmal Atrial Fibrillation (ICD10:  I48.0) The patient's CHA2DS2-VASc score is 6, indicating a 9.7% annual risk of stroke.   Patient appears to be maintaining SR. Due for ILR transmission 3/21. Continue Eliquis 5 mg BID Check bmet/CBC Continue Toprol 50 mg daily  2. Secondary Hypercoagulable State (ICD10:  D68.69) The patient is at significant risk for stroke/thromboembolism based upon her CHA2DS2-VASc Score of 6.  Continue Apixaban (Eliquis).   3. HTN Stable, no changes today.   Follow up with the AF clinic via telehealth in 3 months.    [facility] [address] [place], [place] [address] [phone] 01/03/2021 11:39 AM

## 2021-01-03 ENCOUNTER — Other Ambulatory Visit: Payer: Self-pay

## 2021-01-03 ENCOUNTER — Encounter (HOSPITAL_COMMUNITY): Payer: Self-pay | Admitting: Physician Assistant

## 2021-01-03 ENCOUNTER — Ambulatory Visit (HOSPITAL_COMMUNITY)
Admission: RE | Admit: 2021-01-03 | Discharge: 2021-01-03 | Disposition: A | Discharge status: Home / Self Care | Payer: Medicare Other | Source: Ambulatory Visit | Attending: Physician Assistant | Admitting: Physician Assistant

## 2021-01-03 VITALS — BP 140/80 | HR 60 | Ht 64.0 in | Wt 169.6 lb

## 2021-01-03 DIAGNOSIS — I10 Essential (primary) hypertension: Secondary | ICD-10-CM | POA: Diagnosis not present

## 2021-01-03 DIAGNOSIS — Z7901 Long term (current) use of anticoagulants: Secondary | ICD-10-CM | POA: Insufficient documentation

## 2021-01-03 DIAGNOSIS — I071 Rheumatic tricuspid insufficiency: Secondary | ICD-10-CM | POA: Insufficient documentation

## 2021-01-03 DIAGNOSIS — I480 Paroxysmal atrial fibrillation: Secondary | ICD-10-CM | POA: Insufficient documentation

## 2021-01-03 DIAGNOSIS — D6869 Other thrombophilia: Secondary | ICD-10-CM | POA: Diagnosis not present

## 2021-01-03 LAB — BASIC METABOLIC PANEL
Anion gap: 10 (ref 5–15)
BUN: 23 mg/dL (ref 8–23)
CO2: 22 mmol/L (ref 22–32)
Calcium: 9.8 mg/dL (ref 8.9–10.3)
Chloride: 105 mmol/L (ref 98–111)
Creatinine, Ser: 0.82 mg/dL (ref 0.44–1.00)
GFR, Estimated: >60 mL/min (ref >60)
Glucose, Bld: 92 mg/dL (ref 70–99)
Potassium: 4.1 mmol/L (ref 3.5–5.1)
Sodium: 137 mmol/L (ref 135–145)

## 2021-01-03 LAB — CBC
HCT: 40.5 % (ref 36.0–46.0)
Hemoglobin: 13 g/dL (ref 12.0–15.0)
MCH: 29.1 pg (ref 26.0–34.0)
MCHC: 32.1 g/dL (ref 30.0–36.0)
MCV: 90.8 fL (ref 80.0–100.0)
Platelets: 226 10*3/uL (ref 150–400)
RBC: 4.46 MIL/uL (ref 3.87–5.11)
RDW: 13.2 % (ref 11.5–15.5)
WBC: 9.8 10*3/uL (ref 4.0–10.5)
nRBC: 0 % (ref 0.0–0.2)

## 2021-01-14 ENCOUNTER — Other Ambulatory Visit (HOSPITAL_COMMUNITY): Payer: Self-pay | Admitting: *Deleted

## 2021-01-14 MED ORDER — APIXABAN 5 MG PO TABS: 5.0000 mg | ORAL_TABLET | Freq: Two times a day (BID) | ORAL | 2 refills | Status: DC

## 2021-01-15 ENCOUNTER — Other Ambulatory Visit (HOSPITAL_COMMUNITY): Payer: Self-pay | Admitting: *Deleted

## 2021-02-11 ENCOUNTER — Ambulatory Visit (INDEPENDENT_AMBULATORY_CARE_PROVIDER_SITE_OTHER): Payer: Medicare Other

## 2021-02-11 DIAGNOSIS — I480 Paroxysmal atrial fibrillation: Secondary | ICD-10-CM | POA: Diagnosis not present

## 2021-02-12 LAB — CUP PACEART REMOTE DEVICE CHECK
Date Time Interrogation Session: 20220417230456
Implantable Pulse Generator Implant Date: 20210129

## 2021-02-28 NOTE — Progress Notes (Signed)
Carelink Summary Report / Loop Recorder 

## 2021-03-18 ENCOUNTER — Ambulatory Visit (INDEPENDENT_AMBULATORY_CARE_PROVIDER_SITE_OTHER): Payer: Medicare Other

## 2021-03-18 DIAGNOSIS — G459 Transient cerebral ischemic attack, unspecified: Secondary | ICD-10-CM | POA: Diagnosis not present

## 2021-03-20 LAB — CUP PACEART REMOTE DEVICE CHECK
Date Time Interrogation Session: 20220520230331
Implantable Pulse Generator Implant Date: 20210129

## 2021-04-08 ENCOUNTER — Ambulatory Visit (HOSPITAL_COMMUNITY)
Admission: RE | Admit: 2021-04-08 | Discharge: 2021-04-08 | Disposition: A | Discharge status: Home / Self Care | Payer: Medicare Other | Source: Ambulatory Visit | Attending: Physician Assistant | Admitting: Physician Assistant

## 2021-04-08 ENCOUNTER — Encounter (HOSPITAL_COMMUNITY): Payer: Self-pay | Admitting: Physician Assistant

## 2021-04-08 ENCOUNTER — Other Ambulatory Visit: Payer: Self-pay

## 2021-04-08 VITALS — BP 149/71 | HR 65 | Ht 64.0 in | Wt 163.0 lb

## 2021-04-08 DIAGNOSIS — D6869 Other thrombophilia: Secondary | ICD-10-CM

## 2021-04-08 DIAGNOSIS — I480 Paroxysmal atrial fibrillation: Secondary | ICD-10-CM | POA: Diagnosis not present

## 2021-04-08 MED ORDER — ELIQUIS 5 MG PO TABS: 5.0000 mg | ORAL_TABLET | Freq: Two times a day (BID) | ORAL | 2 refills | Status: DC

## 2021-04-08 NOTE — Progress Notes (Signed)
Carelink Summary Report / Loop Recorder 

## 2021-04-08 NOTE — Progress Notes (Signed)
Electrophysiology TeleHealth Note   Audio telehealth visit is felt to be most appropriate for this patient at this time.  See consent below from today for patient consent regarding telehealth for the [facility].    Date:  04/08/2021   ID:  Courtney Shea, DOB 09-03-1930, MRN [id number]  Location: home  Provider location: [address] [place], [place] [address] Evaluation Performed: Follow up  PCP:  Josetta Huddle, MD  Primary Electrophysiologist: Dr Rayann Heman    History of Present Illness: Courtney Shea is a 87 y.o. female with a history of breast cancer, HTN, hypothyroidism, TIA, atrial fibrillation who presents via audio conferencing for a telehealth visit today. She was hospitalized 10/2019 with an acute TIA and a ILR was placed by Dr Rayann Heman at that time. The device clinic received an alert for two afib episodes on 11/03/20 which lasted 14 and 8 minutes. Patient was unaware of any arrhythmia at that time. She denies any significant snoring or alcohol use. Patient is on Eliquis for a CHADS2VASC score of 6.  On follow up today, patient reports that she has done well since her last visit. She was started on amlodipine for BP and has noted some increased ankle swelling. She does wear compression stockings. She denies any heart racing or palpitations. ILR shows 0% afib burden.  Today, she denies symptoms of palpitations, chest pain, shortness of breath, orthopnea, PND, claudication, dizziness, presyncope, syncope, bleeding, or neurologic sequela. The patient is tolerating medications without difficulties and is otherwise without complaint today.    Atrial Fibrillation Risk Factors:  she does not have symptoms or diagnosis of sleep apnea. she does not have a history of rheumatic fever. she does not have a history of alcohol use. The patient does not have a history of early familial atrial fibrillation or other arrhythmias.  she has a BMI of Body mass index is 27.98  kg/m.Marland Kitchen Filed Weights   04/08/21 1102  Weight: 73.9 kg      Atrial Fibrillation Management history:  Previous antiarrhythmic drugs: none Previous cardioversions: none Previous ablations: none CHADS2VASC score: 6 Anticoagulation history: Eliquis   Past Medical History:  Diagnosis Date   Breast cancer ([place])    Breast cancer of lower-outer quadrant of right female breast ([place]) 05/07/2015   GERD (gastroesophageal reflux disease)    Hypertension    Hypothyroid    Personal history of radiation therapy    Stroke ([place])    TIA, no deficits, placed on Plavix   Syncope    Ventricular bigeminy    Past Surgical History:  Procedure Laterality Date   ABDOMINAL HYSTERECTOMY     BLADDER SUSPENSION     BREAST LUMPECTOMY Right    2016   CATARACT EXTRACTION     INCONTINENCE SURGERY     LOOP RECORDER INSERTION N/A 11/25/2019   Procedure: LOOP RECORDER INSERTION;  Surgeon: Thompson Grayer, MD;  Location: [facility];  Service: Cardiovascular;  Laterality: N/A;   Ovarian cyst removed     PARTIAL MASTECTOMY WITH NEEDLE LOCALIZATION Bilateral 05/29/2015   Procedure: LEFT BREAST PARTIAL MASTECTOMY WITH NEEDLE LOCALIZATION TIMES 2 AND RIGHT BREAST PARTIAL MASTECTOMY WITH NEEDLE LOCALIZATION;  Surgeon: Erroll Luna, MD;  Location: [facility];  Service: General;  Laterality: Bilateral;   RE-EXCISION OF BREAST LUMPECTOMY Left 06/21/2015   Procedure: LEFT BREAST RE EXCISION OF LUMPECTOMY;  Surgeon: Erroll Luna, MD;  Location: [facility];  Service: General;  Laterality: Left;  Current Outpatient Medications  Medication Sig Dispense Refill   acetaminophen (TYLENOL) 325 MG tablet Take 2 tablets (650 mg total) by mouth every 6 (six) hours as needed for mild pain (or Fever >/= 101).     amLODipine (NORVASC) 2.5 MG tablet Take 2.5 mg by mouth every morning.     atorvastatin (LIPITOR) 20 MG tablet Take 1 tablet (20 mg total) by mouth daily at 6 PM. 30 tablet 0    Calcium Carb-Cholecalciferol (CALCIUM+D3 PO) Take 1 tablet by mouth daily with lunch.     cetirizine (ZYRTEC) 10 MG tablet Take 10 mg by mouth daily.     clobetasol (TEMOVATE) 0.05 % external solution Apply topically.     cloNIDine (CATAPRES) 0.3 MG tablet Take 0.3 mg by mouth at bedtime.     Cranberry 500 MG CAPS Take 500 mg by mouth daily.     furosemide (LASIX) 20 MG tablet Take 20 mg by mouth daily as needed.     levothyroxine (SYNTHROID) 100 MCG tablet Take 100 mcg by mouth daily.      losartan (COZAAR) 100 MG tablet Take 100 mg by mouth daily.     metoprolol succinate (TOPROL-XL) 50 MG 24 hr tablet Take 50 mg by mouth daily. Take with or immediately following a meal.     pantoprazole (PROTONIX) 40 MG tablet Take 40 mg by mouth every evening.     sertraline (ZOLOFT) 25 MG tablet Take 25 mg by mouth every morning.     apixaban (ELIQUIS) 5 MG TABS tablet Take 1 tablet (5 mg total) by mouth 2 (two) times daily. 180 tablet 2   No current facility-administered medications for this encounter.    Allergies:   Codeine, Macrodantin [nitrofurantoin macrocrystal], Crestor [rosuvastatin calcium], Doxycycline, Hydrochlorothiazide, Norvasc [amlodipine besylate], and Sulfa antibiotics   Social History:  The patient  reports that she has never smoked. She has never used smokeless tobacco. She reports that she does not drink alcohol and does not use drugs.   Family History:  The patient's  family history includes Breast cancer (age of onset: 43) in her sister; Cancer in her sister; Coronary artery disease in her mother; Kidney disease in her brother.    ROS:  Please see the history of present illness.   All other systems are personally reviewed and negative.   Vitals: Vitals:   04/08/21 1102  BP: (!) 149/71  Pulse: 65  Height: 5\' 4"  (1.626 m)  Weight: 73.9 kg  BMI (Calculated): 27.97    Recent Labs: 01/03/2021: BUN 23; Creatinine, Ser 0.82; Hemoglobin 13.0; Platelets 226; Potassium 4.1; Sodium  137  personally reviewed    Echo 11/24/19 demonstrates 1. Left ventricular ejection fraction, by visual estimation, is 55 to  60%. The left ventricle has normal function. Left ventricular septal wall  thickness was moderately increased. Moderately increased left ventricular posterior wall thickness. There is moderately increased left ventricular hypertrophy.   2. Definity contrast agent was given IV to delineate the left ventricular  endocardial borders.   3. Left ventricular diastolic parameters are consistent with Grade I  diastolic dysfunction (impaired relaxation).   4. The left ventricle has no regional wall motion abnormalities.   5. Global right ventricle has normal systolic function.The right  ventricular size is normal. No increase in right ventricular wall  thickness.   6. Left atrial size was normal.   7. Right atrial size was normal.   8. The mitral valve is normal in structure. Trivial mitral valve  regurgitation. No  evidence of mitral stenosis.   9. The tricuspid valve is normal in structure.  10. The tricuspid valve is normal in structure. Tricuspid valve  regurgitation is mild.  11. The aortic valve is tricuspid. Aortic valve regurgitation is not  visualized. No evidence of aortic valve sclerosis or stenosis.  12. The pulmonic valve was normal in structure. Pulmonic valve  regurgitation is not visualized.  13. Normal pulmonary artery systolic pressure.  14. The inferior vena cava is normal in size with greater than 50%  respiratory variability, suggesting right atrial pressure of 3 mmHg.    CHA2DS2-VASc Score = 6  The patient's score is based upon: CHF History: No HTN History: Yes Diabetes History: No Stroke History: Yes Vascular Disease History: No Age Score: 2 Gender Score: 1      ASSESSMENT AND PLAN: 1. Paroxysmal Atrial Fibrillation (ICD10:  I48.0) The patient's CHA2DS2-VASc score is 6, indicating a 9.7% annual risk of stroke.   ILR shows 0% burden  with only very brief episodes.  Continue Eliquis 5 mg BID Continue Toprol 50 mg daily  2. Secondary Hypercoagulable State (ICD10:  D68.69) The patient is at significant risk for stroke/thromboembolism based upon her CHA2DS2-VASc Score of 6.  Start Apixaban (Eliquis).   3. HTN Stable, no changes today.   Follow-up with AF clinic for telehealth visit in 6 months.   Current medicines are reviewed at length with the patient today.   The patient does not have concerns regarding her medicines.  The following changes were made today:  none  Labs/ tests ordered today include:  No orders of the defined types were placed in this encounter.   Patient Risk:  after full review of this patients clinical status, I feel that they are at moderate risk at this time.   Today, I have spent 9 minutes with the patient with telehealth technology discussing the above.    Gwenlyn Perking PA-C 04/08/2021 11:36 AM  [facility] [address] [place], [place] [address] [phone]   I hereby voluntarily request, consent and authorize the [facility] and its employed or contracted physicians, physician assistants, nurse practitioners or other licensed health care professionals (the Practitioner), to provide me with telemedicine health care services (the "Services") as deemed necessary by the treating Practitioner. I acknowledge and consent to receive the Services by the Practitioner via telemedicine. I understand that the telemedicine visit will involve communicating with the Practitioner through live audiovisual communication technology and the disclosure of certain medical information by electronic transmission. I acknowledge that I have been given the opportunity to request an in-person assessment or other available alternative prior to the telemedicine visit and am voluntarily participating in the telemedicine visit.   I understand that I have the right to  withhold or withdraw my consent to the use of telemedicine in the course of my care at any time, without affecting my right to future care or treatment, and that the Practitioner or I may terminate the telemedicine visit at any time. I understand that I have the right to inspect all information obtained and/or recorded in the course of the telemedicine visit and may receive copies of available information for a reasonable fee.  I understand that some of the potential risks of receiving the Services via telemedicine include:   Delay or interruption in medical evaluation due to technological equipment failure or disruption;  Information transmitted may not be sufficient (e.g. poor resolution of images) to allow for appropriate medical decision  making by the Practitioner; and/or  In rare instances, security protocols could fail, causing a breach of personal health information.   Furthermore, I acknowledge that it is my responsibility to provide information about my medical history, conditions and care that is complete and accurate to the best of my ability. I acknowledge that Practitioner's advice, recommendations, and/or decision may be based on factors not within their control, such as incomplete or inaccurate data provided by me or distortions of diagnostic images or specimens that may result from electronic transmissions. I understand that the practice of medicine is not an exact science and that Practitioner makes no warranties or guarantees regarding treatment outcomes. I acknowledge that I will receive a copy of this consent concurrently upon execution via email to the email address I last provided but may also request a printed copy by calling the office of the [facility].  I understand that my insurance will be billed for this visit.   I have read or had this consent read to me.  I understand the contents of this consent, which adequately explains the benefits and risks of the  Services being provided via telemedicine.  I have been provided ample opportunity to ask questions regarding this consent and the Services and have had my questions answered to my satisfaction.  I give my informed consent for the services to be provided through the use of telemedicine in my medical care  By participating in this telemedicine visit I agree to the above.

## 2021-04-18 LAB — CUP PACEART REMOTE DEVICE CHECK
Date Time Interrogation Session: 20220622230613
Implantable Pulse Generator Implant Date: 20210129

## 2021-04-22 ENCOUNTER — Ambulatory Visit (INDEPENDENT_AMBULATORY_CARE_PROVIDER_SITE_OTHER): Payer: Medicare Other

## 2021-04-22 DIAGNOSIS — G459 Transient cerebral ischemic attack, unspecified: Secondary | ICD-10-CM | POA: Diagnosis not present

## 2021-05-13 NOTE — Progress Notes (Signed)
Carelink Summary Report / Loop Recorder 

## 2021-05-23 ENCOUNTER — Ambulatory Visit (INDEPENDENT_AMBULATORY_CARE_PROVIDER_SITE_OTHER): Payer: Medicare Other

## 2021-05-23 DIAGNOSIS — G459 Transient cerebral ischemic attack, unspecified: Secondary | ICD-10-CM

## 2021-05-25 LAB — CUP PACEART REMOTE DEVICE CHECK
Date Time Interrogation Session: 20220729201741
Implantable Pulse Generator Implant Date: 20210129

## 2021-06-11 ENCOUNTER — Other Ambulatory Visit (HOSPITAL_COMMUNITY): Payer: Self-pay

## 2021-06-11 MED ORDER — APIXABAN 5 MG PO TABS: 5.0000 mg | ORAL_TABLET | Freq: Two times a day (BID) | ORAL | 0 refills | Status: DC

## 2021-06-18 NOTE — Progress Notes (Signed)
Carelink Summary Report / Loop Recorder 

## 2021-06-25 ENCOUNTER — Ambulatory Visit (INDEPENDENT_AMBULATORY_CARE_PROVIDER_SITE_OTHER): Payer: Medicare Other

## 2021-06-25 DIAGNOSIS — G459 Transient cerebral ischemic attack, unspecified: Secondary | ICD-10-CM

## 2021-06-25 LAB — CUP PACEART REMOTE DEVICE CHECK
Date Time Interrogation Session: 20220827230726
Implantable Pulse Generator Implant Date: 20210129

## 2021-07-08 NOTE — Progress Notes (Signed)
Carelink Summary Report / Loop Recorder 

## 2021-08-28 LAB — CUP PACEART REMOTE DEVICE CHECK
Date Time Interrogation Session: 20221101230329
Implantable Pulse Generator Implant Date: 20210129

## 2021-09-02 ENCOUNTER — Ambulatory Visit (INDEPENDENT_AMBULATORY_CARE_PROVIDER_SITE_OTHER): Payer: Medicare Other

## 2021-09-02 DIAGNOSIS — I480 Paroxysmal atrial fibrillation: Secondary | ICD-10-CM

## 2021-09-09 NOTE — Progress Notes (Signed)
Carelink Summary Report / Loop Recorder 

## 2021-10-07 ENCOUNTER — Ambulatory Visit (INDEPENDENT_AMBULATORY_CARE_PROVIDER_SITE_OTHER): Payer: Medicare Other

## 2021-10-07 DIAGNOSIS — G459 Transient cerebral ischemic attack, unspecified: Secondary | ICD-10-CM | POA: Diagnosis not present

## 2021-10-08 ENCOUNTER — Ambulatory Visit (HOSPITAL_COMMUNITY)
Admission: RE | Admit: 2021-10-08 | Discharge: 2021-10-08 | Disposition: A | Discharge status: Home / Self Care | Payer: Medicare Other | Source: Ambulatory Visit | Attending: Physician Assistant | Admitting: Physician Assistant

## 2021-10-08 ENCOUNTER — Other Ambulatory Visit: Payer: Self-pay

## 2021-10-08 ENCOUNTER — Encounter (HOSPITAL_COMMUNITY): Payer: Self-pay | Admitting: Physician Assistant

## 2021-10-08 VITALS — BP 155/74 | HR 70 | Ht 64.0 in | Wt 170.0 lb

## 2021-10-08 DIAGNOSIS — I480 Paroxysmal atrial fibrillation: Secondary | ICD-10-CM

## 2021-10-08 DIAGNOSIS — D6869 Other thrombophilia: Secondary | ICD-10-CM

## 2021-10-08 LAB — CUP PACEART REMOTE DEVICE CHECK
Date Time Interrogation Session: 20221204230715
Implantable Pulse Generator Implant Date: 20210129

## 2021-10-08 NOTE — Progress Notes (Signed)
Electrophysiology TeleHealth Note   Audio telehealth visit is felt to be most appropriate for this patient at this time.  See consent below from today for patient consent regarding telehealth for the Atrial Fibrillation Clinic.    Date:  10/08/2021   ID:  Courtney Shea, DOB 03-Mar-1930, MRN [id number]  Location: home  Provider location: [address] [place], [place] [address] Evaluation Performed: Follow up  PCP:  Courtney Huddle, MD  Primary Electrophysiologist: Dr Rayann Heman    History of Present Illness: Courtney Shea is a 87 y.o. female with a history of breast cancer, HTN, hypothyroidism, TIA, atrial fibrillation who presents via audio conferencing for a telehealth visit today. She was hospitalized 10/2019 with an acute TIA and a ILR was placed by Dr Rayann Heman at that time. The device clinic received an alert for two afib episodes on 11/03/20 which lasted 14 and 8 minutes. Patient was unaware of any arrhythmia at that time. She denies any significant snoring or alcohol use. Patient is on Eliquis for a CHADS2VASC score of 6.  On follow up today, patient reports that she has done well since her last visit. ILR shows 0.1% afib burden with episodes only lasting a few minutes. She has no awareness of her arrhythmia. Her primary concern today is regarding a pruritic rash, she is already being treated by dermatology. She denies any bleeding issues on anticoagulation.   Today, she denies symptoms of palpitations, chest pain, shortness of breath, orthopnea, PND, claudication, dizziness, presyncope, syncope, bleeding, or neurologic sequela. The patient is tolerating medications without difficulties and is otherwise without complaint today.    Atrial Fibrillation Risk Factors:  she does not have symptoms or diagnosis of sleep apnea. she does not have a history of rheumatic fever. she does not have a history of alcohol use. The patient does not have a history of early familial atrial  fibrillation or other arrhythmias.  she has a BMI of Body mass index is 29.18 kg/m.Marland Kitchen Filed Weights   10/08/21 1047  Weight: 77.1 kg      Atrial Fibrillation Management history:  Previous antiarrhythmic drugs: none Previous cardioversions: none Previous ablations: none CHADS2VASC score: 6 Anticoagulation history: Eliquis   Past Medical History:  Diagnosis Date   Breast cancer ([place])    Breast cancer of lower-outer quadrant of right female breast ([place]) 05/07/2015   GERD (gastroesophageal reflux disease)    Hypertension    Hypothyroid    Personal history of radiation therapy    Stroke ([place])    TIA, no deficits, placed on Plavix   Syncope    Ventricular bigeminy    Past Surgical History:  Procedure Laterality Date   ABDOMINAL HYSTERECTOMY     BLADDER SUSPENSION     BREAST LUMPECTOMY Right    2016   CATARACT EXTRACTION     INCONTINENCE SURGERY     LOOP RECORDER INSERTION N/A 11/25/2019   Procedure: LOOP RECORDER INSERTION;  Surgeon: Thompson Grayer, MD;  Location: [facility];  Service: Cardiovascular;  Laterality: N/A;   Ovarian cyst removed     PARTIAL MASTECTOMY WITH NEEDLE LOCALIZATION Bilateral 05/29/2015   Procedure: LEFT BREAST PARTIAL MASTECTOMY WITH NEEDLE LOCALIZATION TIMES 2 AND RIGHT BREAST PARTIAL MASTECTOMY WITH NEEDLE LOCALIZATION;  Surgeon: Erroll Luna, MD;  Location: [facility];  Service: General;  Laterality: Bilateral;   RE-EXCISION OF BREAST LUMPECTOMY Left 06/21/2015   Procedure: LEFT BREAST RE EXCISION OF LUMPECTOMY;  Surgeon: Erroll Luna, MD;  Location: [facility];  Service: General;  Laterality: Left;     Current Outpatient Medications  Medication Sig Dispense Refill   acetaminophen (TYLENOL) 325 MG tablet Take 2 tablets (650 mg total) by mouth every 6 (six) hours as needed for mild pain (or Fever >/= 101).     amLODipine (NORVASC) 2.5 MG tablet Take 2.5 mg by mouth every morning.     apixaban (ELIQUIS) 5 MG  TABS tablet Take 1 tablet (5 mg total) by mouth 2 (two) times daily. 56 tablet 0   atorvastatin (LIPITOR) 20 MG tablet Take 1 tablet (20 mg total) by mouth daily at 6 PM. 30 tablet 0   Calcium Carb-Cholecalciferol (CALCIUM+D3 PO) Take 1 tablet by mouth daily with lunch.     cetirizine (ZYRTEC) 10 MG tablet Take 10 mg by mouth daily.     clobetasol (TEMOVATE) 0.05 % external solution Apply topically.     cloNIDine (CATAPRES) 0.3 MG tablet Take 0.3 mg by mouth at bedtime.     Cranberry 500 MG CAPS Take 500 mg by mouth daily.     furosemide (LASIX) 20 MG tablet Take 20 mg by mouth daily as needed.     levothyroxine (SYNTHROID) 100 MCG tablet Take 100 mcg by mouth daily.      losartan (COZAAR) 100 MG tablet Take 100 mg by mouth daily.     metoprolol succinate (TOPROL-XL) 50 MG 24 hr tablet Take 50 mg by mouth daily. Take with or immediately following a meal.     pantoprazole (PROTONIX) 40 MG tablet Take 40 mg by mouth every evening.     sertraline (ZOLOFT) 25 MG tablet Take 25 mg by mouth every morning.     No current facility-administered medications for this encounter.    Allergies:   Codeine, Macrodantin [nitrofurantoin macrocrystal], Crestor [rosuvastatin calcium], Doxycycline, Hydrochlorothiazide, Norvasc [amlodipine besylate], Sulfa antibiotics, and Influenza vaccines   Social History:  The patient  reports that she has never smoked. She has never used smokeless tobacco. She reports that she does not drink alcohol and does not use drugs.   Family History:  The patient's  family history includes Breast cancer (age of onset: 43) in her sister; Cancer in her sister; Coronary artery disease in her mother; Kidney disease in her brother.    ROS:  Please see the history of present illness.   All other systems are personally reviewed and negative.   Vitals: Vitals:   10/08/21 1047  BP: (!) 155/74  Pulse: 70  Height: 5\' 4"  (1.626 m)  Weight: 77.1 kg  BMI (Calculated): 29.17    Recent  Labs: 01/03/2021: BUN 23; Creatinine, Ser 0.82; Hemoglobin 13.0; Platelets 226; Potassium 4.1; Sodium 137  personally reviewed    Echo 11/24/19 demonstrates 1. Left ventricular ejection fraction, by visual estimation, is 55 to  60%. The left ventricle has normal function. Left ventricular septal wall  thickness was moderately increased. Moderately increased left ventricular posterior wall thickness. There is moderately increased left ventricular hypertrophy.   2. Definity contrast agent was given IV to delineate the left ventricular  endocardial borders.   3. Left ventricular diastolic parameters are consistent with Grade I  diastolic dysfunction (impaired relaxation).   4. The left ventricle has no regional wall motion abnormalities.   5. Global right ventricle has normal systolic function.The right  ventricular size is normal. No increase in right ventricular wall  thickness.   6. Left atrial size was normal.   7. Right atrial size was normal.  8. The mitral valve is normal in structure. Trivial mitral valve  regurgitation. No evidence of mitral stenosis.   9. The tricuspid valve is normal in structure.  10. The tricuspid valve is normal in structure. Tricuspid valve  regurgitation is mild.  11. The aortic valve is tricuspid. Aortic valve regurgitation is not  visualized. No evidence of aortic valve sclerosis or stenosis.  12. The pulmonic valve was normal in structure. Pulmonic valve  regurgitation is not visualized.  13. Normal pulmonary artery systolic pressure.  14. The inferior vena cava is normal in size with greater than 50%  respiratory variability, suggesting right atrial pressure of 3 mmHg.    CHA2DS2-VASc Score = 6  The patient's score is based upon: CHF History: 0 HTN History: 1 Diabetes History: 0 Stroke History: 2 Vascular Disease History: 0 Age Score: 2 Gender Score: 1      ASSESSMENT AND PLAN: 1. Paroxysmal Atrial Fibrillation (ICD10:  I48.0) The  patient's CHA2DS2-VASc score is 6, indicating a 9.7% annual risk of stroke.   ILR shows 0.1% burden with very brief episodes. Continue Eliquis 5 mg BID. Will check bmet/cbc at next in-person visit. Continue Toprol 50 mg daily  2. Secondary Hypercoagulable State (ICD10:  D68.69) The patient is at significant risk for stroke/thromboembolism based upon her CHA2DS2-VASc Score of 6.  Continue Apixaban (Eliquis).   3. HTN Stable, no changes today.   Follow-up in the AF clinic in 6 months.   Current medicines are reviewed at length with the patient today.   The patient does not have concerns regarding her medicines.  The following changes were made today:  none  Labs/ tests ordered today include:  No orders of the defined types were placed in this encounter.   Patient Risk:  after full review of this patients clinical status, I feel that they are at moderate risk at this time.   Today, I have spent 8 minutes with the patient with telehealth technology discussing the above.    Gwenlyn Perking PA-C 10/08/2021 11:19 AM  [facility] [address] [place], [place] [address] [phone]   I hereby voluntarily request, consent and authorize the [facility] and its employed or contracted physicians, physician assistants, nurse practitioners or other licensed health care professionals (the Practitioner), to provide me with telemedicine health care services (the "Services") as deemed necessary by the treating Practitioner. I acknowledge and consent to receive the Services by the Practitioner via telemedicine. I understand that the telemedicine visit will involve communicating with the Practitioner through live audiovisual communication technology and the disclosure of certain medical information by electronic transmission. I acknowledge that I have been given the opportunity to request an in-person assessment or other available alternative prior to the  telemedicine visit and am voluntarily participating in the telemedicine visit.   I understand that I have the right to withhold or withdraw my consent to the use of telemedicine in the course of my care at any time, without affecting my right to future care or treatment, and that the Practitioner or I may terminate the telemedicine visit at any time. I understand that I have the right to inspect all information obtained and/or recorded in the course of the telemedicine visit and may receive copies of available information for a reasonable fee.  I understand that some of the potential risks of receiving the Services via telemedicine include:   Delay or interruption in medical evaluation due to technological equipment failure or disruption;  Information transmitted may not be sufficient (e.g. poor resolution of images) to allow for appropriate medical decision making by the Practitioner; and/or  In rare instances, security protocols could fail, causing a breach of personal health information.   Furthermore, I acknowledge that it is my responsibility to provide information about my medical history, conditions and care that is complete and accurate to the best of my ability. I acknowledge that Practitioner's advice, recommendations, and/or decision may be based on factors not within their control, such as incomplete or inaccurate data provided by me or distortions of diagnostic images or specimens that may result from electronic transmissions. I understand that the practice of medicine is not an exact science and that Practitioner makes no warranties or guarantees regarding treatment outcomes. I acknowledge that I will receive a copy of this consent concurrently upon execution via email to the email address I last provided but may also request a printed copy by calling the office of the [facility].  I understand that my insurance will be billed for this visit.   I have read or had this consent  read to me.  I understand the contents of this consent, which adequately explains the benefits and risks of the Services being provided via telemedicine.  I have been provided ample opportunity to ask questions regarding this consent and the Services and have had my questions answered to my satisfaction.  I give my informed consent for the services to be provided through the use of telemedicine in my medical care  By participating in this telemedicine visit I agree to the above.

## 2021-10-17 NOTE — Progress Notes (Signed)
Carelink Summary Report / Loop Recorder 

## 2021-11-11 ENCOUNTER — Ambulatory Visit (INDEPENDENT_AMBULATORY_CARE_PROVIDER_SITE_OTHER): Payer: Medicare Other

## 2021-11-11 DIAGNOSIS — I480 Paroxysmal atrial fibrillation: Secondary | ICD-10-CM | POA: Diagnosis not present

## 2021-11-12 LAB — CUP PACEART REMOTE DEVICE CHECK
Date Time Interrogation Session: 20230115230153
Implantable Pulse Generator Implant Date: 20210129

## 2021-11-21 NOTE — Progress Notes (Signed)
Carelink Summary Report / Loop Recorder 

## 2021-12-02 ENCOUNTER — Other Ambulatory Visit (HOSPITAL_COMMUNITY): Payer: Self-pay | Admitting: Physician Assistant

## 2021-12-16 ENCOUNTER — Ambulatory Visit (INDEPENDENT_AMBULATORY_CARE_PROVIDER_SITE_OTHER): Payer: Medicare Other

## 2021-12-16 DIAGNOSIS — I480 Paroxysmal atrial fibrillation: Secondary | ICD-10-CM

## 2021-12-16 LAB — CUP PACEART REMOTE DEVICE CHECK
Date Time Interrogation Session: 20230217230517
Implantable Pulse Generator Implant Date: 20210129

## 2021-12-20 NOTE — Progress Notes (Signed)
Carelink Summary Report / Loop Recorder 

## 2022-01-20 ENCOUNTER — Ambulatory Visit (INDEPENDENT_AMBULATORY_CARE_PROVIDER_SITE_OTHER): Payer: Medicare Other

## 2022-01-20 DIAGNOSIS — G459 Transient cerebral ischemic attack, unspecified: Secondary | ICD-10-CM

## 2022-01-21 LAB — CUP PACEART REMOTE DEVICE CHECK
Date Time Interrogation Session: 20230326230500
Implantable Pulse Generator Implant Date: 20210129

## 2022-01-29 NOTE — Progress Notes (Signed)
Carelink Summary Report / Loop Recorder 

## 2022-02-24 ENCOUNTER — Ambulatory Visit (INDEPENDENT_AMBULATORY_CARE_PROVIDER_SITE_OTHER): Payer: Medicare Other

## 2022-02-24 DIAGNOSIS — G459 Transient cerebral ischemic attack, unspecified: Secondary | ICD-10-CM | POA: Diagnosis not present

## 2022-02-24 LAB — CUP PACEART REMOTE DEVICE CHECK
Date Time Interrogation Session: 20230428230130
Implantable Pulse Generator Implant Date: 20210129

## 2022-03-10 NOTE — Progress Notes (Signed)
Carelink Summary Report / [email or web address] 

## 2022-03-31 ENCOUNTER — Ambulatory Visit (INDEPENDENT_AMBULATORY_CARE_PROVIDER_SITE_OTHER): Payer: Medicare Other

## 2022-03-31 DIAGNOSIS — G459 Transient cerebral ischemic attack, unspecified: Secondary | ICD-10-CM | POA: Diagnosis not present

## 2022-04-01 LAB — CUP PACEART REMOTE DEVICE CHECK
Date Time Interrogation Session: 20230606104931
Implantable Pulse Generator Implant Date: 20210129

## 2022-04-15 NOTE — Progress Notes (Signed)
Carelink Summary Report / Loop Recorder 

## 2022-05-05 ENCOUNTER — Ambulatory Visit (INDEPENDENT_AMBULATORY_CARE_PROVIDER_SITE_OTHER): Payer: Medicare Other

## 2022-05-05 DIAGNOSIS — G459 Transient cerebral ischemic attack, unspecified: Secondary | ICD-10-CM | POA: Diagnosis not present

## 2022-05-06 LAB — CUP PACEART REMOTE DEVICE CHECK
Date Time Interrogation Session: 20230703230851
Implantable Pulse Generator Implant Date: 20210129

## 2022-05-27 NOTE — Progress Notes (Signed)
Carelink Summary Report / Loop Recorder 

## 2022-06-05 LAB — CUP PACEART REMOTE DEVICE CHECK
Date Time Interrogation Session: 20230805230807
Implantable Pulse Generator Implant Date: 20210129

## 2022-06-09 ENCOUNTER — Ambulatory Visit: Payer: Medicare Other

## 2022-07-14 ENCOUNTER — Ambulatory Visit (INDEPENDENT_AMBULATORY_CARE_PROVIDER_SITE_OTHER): Payer: Medicare Other

## 2022-07-14 DIAGNOSIS — G459 Transient cerebral ischemic attack, unspecified: Secondary | ICD-10-CM

## 2022-07-16 LAB — CUP PACEART REMOTE DEVICE CHECK
Date Time Interrogation Session: 20230919132921
Implantable Pulse Generator Implant Date: 20210129

## 2022-07-29 NOTE — Progress Notes (Signed)
Carelink Summary Report / Loop Recorder 

## 2022-08-12 ENCOUNTER — Other Ambulatory Visit (HOSPITAL_COMMUNITY): Payer: Self-pay | Admitting: Physician Assistant

## 2022-08-18 ENCOUNTER — Ambulatory Visit (INDEPENDENT_AMBULATORY_CARE_PROVIDER_SITE_OTHER): Payer: Medicare Other

## 2022-08-18 DIAGNOSIS — G459 Transient cerebral ischemic attack, unspecified: Secondary | ICD-10-CM | POA: Diagnosis not present

## 2022-08-20 LAB — CUP PACEART REMOTE DEVICE CHECK
Date Time Interrogation Session: 20231022230942
Implantable Pulse Generator Implant Date: 20210129

## 2022-09-17 NOTE — Progress Notes (Signed)
Carelink Summary Report / Loop Recorder 

## 2022-09-22 ENCOUNTER — Ambulatory Visit (INDEPENDENT_AMBULATORY_CARE_PROVIDER_SITE_OTHER): Payer: Medicare Other

## 2022-09-22 DIAGNOSIS — G459 Transient cerebral ischemic attack, unspecified: Secondary | ICD-10-CM

## 2022-09-23 LAB — CUP PACEART REMOTE DEVICE CHECK
Date Time Interrogation Session: 20231126230950
Implantable Pulse Generator Implant Date: 20210129

## 2022-10-14 ENCOUNTER — Other Ambulatory Visit (HOSPITAL_COMMUNITY): Payer: Self-pay | Admitting: Physician Assistant

## 2022-10-23 ENCOUNTER — Ambulatory Visit (INDEPENDENT_AMBULATORY_CARE_PROVIDER_SITE_OTHER): Payer: Medicare Other

## 2022-10-23 DIAGNOSIS — G459 Transient cerebral ischemic attack, unspecified: Secondary | ICD-10-CM | POA: Diagnosis not present

## 2022-10-23 LAB — CUP PACEART REMOTE DEVICE CHECK
Date Time Interrogation Session: 20231227230814
Implantable Pulse Generator Implant Date: 20210129

## 2022-11-04 NOTE — Progress Notes (Signed)
Carelink Summary Report / Loop Recorder 

## 2022-11-05 ENCOUNTER — Other Ambulatory Visit (HOSPITAL_COMMUNITY): Payer: Self-pay | Admitting: Physician Assistant

## 2022-11-10 NOTE — Progress Notes (Signed)
[facility] Summary Report / Loop Recorder

## 2022-11-25 ENCOUNTER — Ambulatory Visit: Payer: Medicare Other

## 2022-11-25 DIAGNOSIS — G459 Transient cerebral ischemic attack, unspecified: Secondary | ICD-10-CM | POA: Diagnosis not present

## 2022-11-25 LAB — CUP PACEART REMOTE DEVICE CHECK
Date Time Interrogation Session: 20240127230202
Implantable Pulse Generator Implant Date: 20210129

## 2022-12-01 ENCOUNTER — Ambulatory Visit: Payer: Medicare Other

## 2022-12-23 NOTE — Progress Notes (Signed)
Carelink Summary Report / Loop Recorder 

## 2022-12-24 ENCOUNTER — Ambulatory Visit: Payer: Medicare Other

## 2022-12-24 DIAGNOSIS — G459 Transient cerebral ischemic attack, unspecified: Secondary | ICD-10-CM

## 2022-12-25 LAB — CUP PACEART REMOTE DEVICE CHECK
Date Time Interrogation Session: 20240227230510
Implantable Pulse Generator Implant Date: 20210129

## 2022-12-29 ENCOUNTER — Ambulatory Visit: Payer: Medicare Other

## 2023-01-05 ENCOUNTER — Ambulatory Visit: Payer: Medicare Other

## 2023-01-26 ENCOUNTER — Ambulatory Visit (INDEPENDENT_AMBULATORY_CARE_PROVIDER_SITE_OTHER): Payer: Medicare Other

## 2023-01-26 DIAGNOSIS — G459 Transient cerebral ischemic attack, unspecified: Secondary | ICD-10-CM

## 2023-01-27 LAB — CUP PACEART REMOTE DEVICE CHECK
Date Time Interrogation Session: 20240331230436
Implantable Pulse Generator Implant Date: 20210129

## 2023-01-28 NOTE — Progress Notes (Signed)
Carelink Summary Report / Loop Recorder 

## 2023-02-02 ENCOUNTER — Ambulatory Visit: Payer: Medicare Other

## 2023-02-09 ENCOUNTER — Other Ambulatory Visit (HOSPITAL_COMMUNITY): Payer: Self-pay | Admitting: *Deleted

## 2023-02-09 MED ORDER — APIXABAN 5 MG PO TABS: 5.0000 mg | ORAL_TABLET | Freq: Two times a day (BID) | ORAL | 0 refills | Status: DC

## 2023-02-18 ENCOUNTER — Encounter (HOSPITAL_COMMUNITY): Payer: Self-pay | Admitting: Physician Assistant

## 2023-02-18 ENCOUNTER — Ambulatory Visit (HOSPITAL_COMMUNITY)
Admission: RE | Admit: 2023-02-18 | Discharge: 2023-02-18 | Disposition: A | Discharge status: Home / Self Care | Payer: Medicare Other | Source: Ambulatory Visit | Attending: Physician Assistant | Admitting: Physician Assistant

## 2023-02-18 VITALS — BP 140/88 | HR 73 | Ht 64.0 in | Wt 172.0 lb

## 2023-02-18 DIAGNOSIS — I10 Essential (primary) hypertension: Secondary | ICD-10-CM | POA: Insufficient documentation

## 2023-02-18 DIAGNOSIS — D6869 Other thrombophilia: Secondary | ICD-10-CM | POA: Insufficient documentation

## 2023-02-18 DIAGNOSIS — I480 Paroxysmal atrial fibrillation: Secondary | ICD-10-CM | POA: Diagnosis present

## 2023-02-18 DIAGNOSIS — Z853 Personal history of malignant neoplasm of breast: Secondary | ICD-10-CM | POA: Diagnosis not present

## 2023-02-18 DIAGNOSIS — E039 Hypothyroidism, unspecified: Secondary | ICD-10-CM | POA: Insufficient documentation

## 2023-02-18 DIAGNOSIS — Z7901 Long term (current) use of anticoagulants: Secondary | ICD-10-CM | POA: Insufficient documentation

## 2023-02-18 DIAGNOSIS — Z8673 Personal history of transient ischemic attack (TIA), and cerebral infarction without residual deficits: Secondary | ICD-10-CM | POA: Diagnosis not present

## 2023-02-18 LAB — CBC
HCT: 37.4 % (ref 36.0–46.0)
Hemoglobin: 12.4 g/dL (ref 12.0–15.0)
MCH: 29.3 pg (ref 26.0–34.0)
MCHC: 33.2 g/dL (ref 30.0–36.0)
MCV: 88.4 fL (ref 80.0–100.0)
Platelets: 223 10*3/uL (ref 150–400)
RBC: 4.23 MIL/uL (ref 3.87–5.11)
RDW: 13.2 % (ref 11.5–15.5)
WBC: 9 10*3/uL (ref 4.0–10.5)
nRBC: 0 % (ref 0.0–0.2)

## 2023-02-18 LAB — BASIC METABOLIC PANEL
Anion gap: 12 (ref 5–15)
BUN: 27 mg/dL — ABNORMAL HIGH (ref 8–23)
CO2: 22 mmol/L (ref 22–32)
Calcium: 9.6 mg/dL (ref 8.9–10.3)
Chloride: 103 mmol/L (ref 98–111)
Creatinine, Ser: 0.93 mg/dL (ref 0.44–1.00)
GFR, Estimated: 58 mL/min — ABNORMAL LOW (ref >60)
Glucose, Bld: 111 mg/dL — ABNORMAL HIGH (ref 70–99)
Potassium: 4.2 mmol/L (ref 3.5–5.1)
Sodium: 137 mmol/L (ref 135–145)

## 2023-02-18 MED ORDER — APIXABAN 5 MG PO TABS: 5.0000 mg | ORAL_TABLET | Freq: Two times a day (BID) | ORAL | 3 refills | Status: DC

## 2023-02-18 NOTE — Progress Notes (Signed)
Primary Care Physician: Marden Noble, MD Primary Electrophysiologist: Dr Nelly Laurence  Referring Physician: Dr Allred/device clinic   Courtney Shea is a 89 y.o. female with a history of breast cancer, HTN, hypothyroidism, TIA, atrial fibrillation who presents for follow up in the [facility]. She was hospitalized 10/2019 with an acute TIA and a ILR was placed by Dr Johney Frame at that time. The device clinic received an alert for two afib episodes on 11/03/20 which lasted 14 and 8 minutes. Patient was unaware of any arrhythmia at that time. She denies any significant snoring or alcohol use. Patient is on Eliquis for a CHADS2VASC score of 6.  On follow up today, patient reports that she has done very well since her last visit. She has not had any afib symptoms. Her ILR shows 0% overall burden with very brief episodes. No bleeding issues on anticoagulation.   Today, she denies symptoms of palpitations, chest pain, shortness of breath, orthopnea, PND, lower extremity edema, dizziness, presyncope, syncope, snoring, daytime somnolence, bleeding, or neurologic sequela. The patient is tolerating medications without difficulties and is otherwise without complaint today.    Atrial Fibrillation Risk Factors:  she does not have symptoms or diagnosis of sleep apnea. she does not have a history of rheumatic fever. she does not have a history of alcohol use. The patient does not have a history of early familial atrial fibrillation or other arrhythmias.  she has a BMI of Body mass index is 29.52 kg/m.Marland Kitchen Filed Weights   02/18/23 1506  Weight: 78 kg   Family History  Problem Relation Age of Onset   Coronary artery disease Mother        MI at age 70   Breast cancer Sister 80   Kidney disease Brother    Cancer Sister        colon cancer suggested     Atrial Fibrillation Management history:  Previous antiarrhythmic drugs: none Previous cardioversions: none Previous  ablations: none CHADS2VASC score: 6 Anticoagulation history: Eliquis   Past Medical History:  Diagnosis Date   Breast cancer    Breast cancer of lower-outer quadrant of right female breast 05/07/2015   GERD (gastroesophageal reflux disease)    Hypertension    Hypothyroid    Personal history of radiation therapy    Stroke    TIA, no deficits, placed on Plavix   Syncope    Ventricular bigeminy    Past Surgical History:  Procedure Laterality Date   ABDOMINAL HYSTERECTOMY     BLADDER SUSPENSION     BREAST LUMPECTOMY Right    2016   CATARACT EXTRACTION     INCONTINENCE SURGERY     LOOP RECORDER INSERTION N/A 11/25/2019   Procedure: LOOP RECORDER INSERTION;  Surgeon: Hillis Range, MD;  Location: [facility];  Service: Cardiovascular;  Laterality: N/A;   Ovarian cyst removed     PARTIAL MASTECTOMY WITH NEEDLE LOCALIZATION Bilateral 05/29/2015   Procedure: LEFT BREAST PARTIAL MASTECTOMY WITH NEEDLE LOCALIZATION TIMES 2 AND RIGHT BREAST PARTIAL MASTECTOMY WITH NEEDLE LOCALIZATION;  Surgeon: Harriette Bouillon, MD;  Location: [facility];  Service: General;  Laterality: Bilateral;   RE-EXCISION OF BREAST LUMPECTOMY Left 06/21/2015   Procedure: LEFT BREAST RE EXCISION OF LUMPECTOMY;  Surgeon: Harriette Bouillon, MD;  Location: [facility];  Service: General;  Laterality: Left;    Current Outpatient Medications  Medication Sig Dispense Refill   acetaminophen (TYLENOL) 325 MG tablet Take 2 tablets (650 mg total) by  mouth every 6 (six) hours as needed for mild pain (or Fever >/= 101).     amLODipine (NORVASC) 2.5 MG tablet Take 2.5 mg by mouth every morning.     atorvastatin (LIPITOR) 20 MG tablet Take 1 tablet (20 mg total) by mouth daily at 6 PM. 30 tablet 0   Calcium Carb-Cholecalciferol (CALCIUM+D3 PO) Take 1 tablet by mouth daily with lunch.     cetirizine (ZYRTEC) 10 MG tablet Take 10 mg by mouth daily.     clobetasol (TEMOVATE) 0.05 % external solution Apply  topically.     cloNIDine (CATAPRES) 0.3 MG tablet Take 0.3 mg by mouth at bedtime.     Cranberry 500 MG CAPS Take 500 mg by mouth daily.     furosemide (LASIX) 20 MG tablet Take 20 mg by mouth daily as needed.     levothyroxine (SYNTHROID) 100 MCG tablet Take 100 mcg by mouth daily.      losartan (COZAAR) 100 MG tablet Take 100 mg by mouth daily.     metoprolol succinate (TOPROL-XL) 50 MG 24 hr tablet Take 50 mg by mouth daily. Take with or immediately following a meal.     pantoprazole (PROTONIX) 40 MG tablet Take 40 mg by mouth every evening.     sertraline (ZOLOFT) 25 MG tablet Take 25 mg by mouth every morning.     apixaban (ELIQUIS) 5 MG TABS tablet Take 1 tablet (5 mg total) by mouth 2 (two) times daily. 180 tablet 3   No current facility-administered medications for this encounter.    Allergies  Allergen Reactions   Codeine Nausea And Vomiting   Macrodantin [Nitrofurantoin Macrocrystal] Nausea Only    "extreme nausea"   Crestor [Rosuvastatin Calcium] Other (See Comments)    "aching in legs"   Doxycycline Itching and Rash   Hydrochlorothiazide Rash    "all over body rash"   Norvasc [Amlodipine Besylate] Swelling    "swelling in ankles"   Sulfa Antibiotics Nausea And Vomiting   Influenza Vaccines     Doesn't Tolerate Flu And Pneumovax Given Together    Social History   Socioeconomic History   Marital status: Married    Spouse name: Not on file   Number of children: Not on file   Years of education: Not on file   Highest education level: Not on file  Occupational History   Not on file  Tobacco Use   Smoking status: Never   Smokeless tobacco: Never   Tobacco comments:    Never smoke 02/18/23  Substance and Sexual Activity   Alcohol use: No   Drug use: No   Sexual activity: Never    Birth control/protection: Post-menopausal  Other Topics Concern   Not on file  Social History Narrative   Not on file   Social Determinants of Health   Financial Resource  Strain: Not on file  Food Insecurity: Not on file  Transportation Needs: Not on file  Physical Activity: Not on file  Stress: Not on file  Social Connections: Not on file  Intimate Partner Violence: Not on file     ROS- All systems are reviewed and negative except as per the HPI above.  Physical Exam: Vitals:   02/18/23 1506  BP: (!) 140/88  Pulse: 73  Weight: 78 kg  Height:  (1.626 m)     GEN- The patient is a well appearing elderly female, alert and oriented x 3 today.   HEENT-head normocephalic, atraumatic, sclera clear, conjunctiva pink, hearing intact, trachea  midline. Lungs- Clear to ausculation bilaterally, normal work of breathing Heart- Regular rate and rhythm, no murmurs, rubs or gallops  GI- soft, NT, ND, + BS Extremities- no clubbing, cyanosis, or edema MS- no significant deformity or atrophy Skin- no rash or lesion Psych- euthymic mood, full affect Neuro- strength and sensation are intact   Wt Readings from Last 3 Encounters:  02/18/23 78 kg  10/08/21 77.1 kg  04/08/21 73.9 kg    EKG today demonstrates  SR Vent. rate 73 BPM PR interval 162 ms QRS duration 84 ms QT/QTcB 378/416 ms  Echo 11/24/19 demonstrated  1. Left ventricular ejection fraction, by visual estimation, is 55 to  60%. The left ventricle has normal function. Left ventricular septal wall  thickness was moderately increased. Moderately increased left ventricular  posterior wall thickness. There is moderately increased left ventricular hypertrophy.   2. Definity contrast agent was given IV to delineate the left ventricular  endocardial borders.   3. Left ventricular diastolic parameters are consistent with Grade I  diastolic dysfunction (impaired relaxation).   4. The left ventricle has no regional wall motion abnormalities.   5. Global right ventricle has normal systolic function.The right  ventricular size is normal. No increase in right ventricular wall  thickness.   6. Left  atrial size was normal.   7. Right atrial size was normal.   8. The mitral valve is normal in structure. Trivial mitral valve  regurgitation. No evidence of mitral stenosis.   9. The tricuspid valve is normal in structure.  10. The tricuspid valve is normal in structure. Tricuspid valve  regurgitation is mild.  11. The aortic valve is tricuspid. Aortic valve regurgitation is not  visualized. No evidence of aortic valve sclerosis or stenosis.  12. The pulmonic valve was normal in structure. Pulmonic valve  regurgitation is not visualized.  13. Normal pulmonary artery systolic pressure.  14. The inferior vena cava is normal in size with greater than 50%  respiratory variability, suggesting right atrial pressure of 3 mmHg.   Epic records are reviewed at length today  CHA2DS2-VASc Score = 6  The patient's score is based upon: CHF History: 0 HTN History: 1 Diabetes History: 0 Stroke History: 2 Vascular Disease History: 0 Age Score: 2 Gender Score: 1        ASSESSMENT AND PLAN: 1. Paroxysmal Atrial Fibrillation (ICD10:  I48.0) The patient's CHA2DS2-VASc score is 6, indicating a 9.7% annual risk of stroke.   ILR shows 0% afib burden. Continue Eliquis 5 mg BID Check bmet/cbc today.  Continue Toprol 50 mg daily  2. Secondary Hypercoagulable State (ICD10:  D68.69) The patient is at significant risk for stroke/thromboembolism based upon her CHA2DS2-VASc Score of 6.  Continue Apixaban (Eliquis).   3. HTN Stable, no changes today.   Follow up in the AF clinic in one year.    Jorja Loa PA-C Afib Clinic [facility] [address] [place], [place] [address] [phone] 02/18/2023 3:59 PM

## 2023-02-26 LAB — CUP PACEART REMOTE DEVICE CHECK
Date Time Interrogation Session: 20240501230254
Implantable Pulse Generator Implant Date: 20210129

## 2023-03-02 ENCOUNTER — Ambulatory Visit (INDEPENDENT_AMBULATORY_CARE_PROVIDER_SITE_OTHER): Payer: Medicare Other

## 2023-03-02 DIAGNOSIS — G459 Transient cerebral ischemic attack, unspecified: Secondary | ICD-10-CM | POA: Diagnosis not present

## 2023-03-05 NOTE — Progress Notes (Signed)
Carelink Summary Report / Loop Recorder 

## 2023-03-09 ENCOUNTER — Ambulatory Visit: Payer: Medicare Other

## 2023-04-01 LAB — CUP PACEART REMOTE DEVICE CHECK
Date Time Interrogation Session: 20240601230259
Implantable Pulse Generator Implant Date: 20210129

## 2023-04-01 NOTE — Progress Notes (Signed)
Carelink Summary Report / Loop Recorder 

## 2023-04-06 ENCOUNTER — Ambulatory Visit (INDEPENDENT_AMBULATORY_CARE_PROVIDER_SITE_OTHER): Payer: Medicare Other

## 2023-04-06 DIAGNOSIS — G459 Transient cerebral ischemic attack, unspecified: Secondary | ICD-10-CM

## 2023-04-13 ENCOUNTER — Ambulatory Visit: Payer: Medicare Other

## 2023-04-28 ENCOUNTER — Ambulatory Visit (INDEPENDENT_AMBULATORY_CARE_PROVIDER_SITE_OTHER): Payer: Medicare Other

## 2023-04-28 DIAGNOSIS — G459 Transient cerebral ischemic attack, unspecified: Secondary | ICD-10-CM | POA: Diagnosis not present

## 2023-04-29 LAB — CUP PACEART REMOTE DEVICE CHECK
Date Time Interrogation Session: 20240702230832
Implantable Pulse Generator Implant Date: 20210129

## 2023-04-29 NOTE — Progress Notes (Signed)
Carelink Summary Report / Loop Recorder 

## 2023-05-11 ENCOUNTER — Ambulatory Visit: Payer: Medicare Other

## 2023-05-18 ENCOUNTER — Ambulatory Visit: Payer: Medicare Other

## 2023-05-22 NOTE — Progress Notes (Signed)
Carelink Summary Report / Loop Recorder 

## 2023-06-15 ENCOUNTER — Ambulatory Visit (INDEPENDENT_AMBULATORY_CARE_PROVIDER_SITE_OTHER): Payer: Medicare Other

## 2023-06-15 DIAGNOSIS — G459 Transient cerebral ischemic attack, unspecified: Secondary | ICD-10-CM

## 2023-06-15 LAB — CUP PACEART REMOTE DEVICE CHECK
Date Time Interrogation Session: 20240818230629
Implantable Pulse Generator Implant Date: 20210129

## 2023-06-22 ENCOUNTER — Ambulatory Visit: Payer: Medicare Other

## 2023-06-26 NOTE — Progress Notes (Signed)
Carelink Summary Report / Loop Recorder 

## 2023-07-20 ENCOUNTER — Ambulatory Visit (INDEPENDENT_AMBULATORY_CARE_PROVIDER_SITE_OTHER): Payer: Medicare Other

## 2023-07-20 DIAGNOSIS — G459 Transient cerebral ischemic attack, unspecified: Secondary | ICD-10-CM | POA: Diagnosis not present

## 2023-07-21 LAB — CUP PACEART REMOTE DEVICE CHECK
Date Time Interrogation Session: 20240918230503
Implantable Pulse Generator Implant Date: 20210129

## 2023-07-25 ENCOUNTER — Other Ambulatory Visit: Payer: Self-pay | Admitting: Internal Medicine

## 2023-07-25 DIAGNOSIS — M85859 Other specified disorders of bone density and structure, unspecified thigh: Secondary | ICD-10-CM

## 2023-07-27 ENCOUNTER — Ambulatory Visit: Payer: Medicare Other

## 2023-08-04 NOTE — Progress Notes (Signed)
Carelink Summary Report / Loop Recorder 

## 2023-08-24 ENCOUNTER — Ambulatory Visit (INDEPENDENT_AMBULATORY_CARE_PROVIDER_SITE_OTHER): Payer: Medicare Other

## 2023-08-24 DIAGNOSIS — G459 Transient cerebral ischemic attack, unspecified: Secondary | ICD-10-CM | POA: Diagnosis not present

## 2023-08-24 LAB — CUP PACEART REMOTE DEVICE CHECK
Date Time Interrogation Session: 20241027230149
Implantable Pulse Generator Implant Date: 20210129

## 2023-08-31 ENCOUNTER — Ambulatory Visit: Payer: Medicare Other

## 2023-09-14 NOTE — Progress Notes (Signed)
Carelink Summary Report / Loop Recorder 

## 2023-09-28 ENCOUNTER — Ambulatory Visit (INDEPENDENT_AMBULATORY_CARE_PROVIDER_SITE_OTHER): Payer: Medicare Other

## 2023-09-28 DIAGNOSIS — G459 Transient cerebral ischemic attack, unspecified: Secondary | ICD-10-CM

## 2023-09-28 LAB — CUP PACEART REMOTE DEVICE CHECK
Date Time Interrogation Session: 20241130230112
Implantable Pulse Generator Implant Date: 20210129

## 2023-10-05 ENCOUNTER — Ambulatory Visit: Payer: Medicare Other

## 2023-11-02 ENCOUNTER — Ambulatory Visit (INDEPENDENT_AMBULATORY_CARE_PROVIDER_SITE_OTHER): Payer: Medicare Other

## 2023-11-02 DIAGNOSIS — G459 Transient cerebral ischemic attack, unspecified: Secondary | ICD-10-CM | POA: Diagnosis not present

## 2023-11-03 LAB — CUP PACEART REMOTE DEVICE CHECK
Date Time Interrogation Session: 20250105230015
Implantable Pulse Generator Implant Date: 20210129

## 2023-11-09 ENCOUNTER — Ambulatory Visit: Payer: Medicare Other

## 2023-12-07 ENCOUNTER — Ambulatory Visit (INDEPENDENT_AMBULATORY_CARE_PROVIDER_SITE_OTHER): Payer: Medicare Other

## 2023-12-07 DIAGNOSIS — G459 Transient cerebral ischemic attack, unspecified: Secondary | ICD-10-CM

## 2023-12-14 ENCOUNTER — Ambulatory Visit: Payer: Medicare Other

## 2023-12-14 NOTE — Progress Notes (Signed)
 [facility] Summary Report / Loop Recorder

## 2023-12-14 NOTE — Addendum Note (Signed)
Addended by: Geralyn Flash D on: 12/14/2023 01:25 PM   Modules accepted: Orders

## 2023-12-23 LAB — CUP PACEART REMOTE DEVICE CHECK
Date Time Interrogation Session: 20250205230142
Implantable Pulse Generator Implant Date: 20210129

## 2024-01-11 ENCOUNTER — Ambulatory Visit (INDEPENDENT_AMBULATORY_CARE_PROVIDER_SITE_OTHER): Payer: Medicare Other

## 2024-01-11 DIAGNOSIS — G459 Transient cerebral ischemic attack, unspecified: Secondary | ICD-10-CM

## 2024-01-12 LAB — CUP PACEART REMOTE DEVICE CHECK
Date Time Interrogation Session: 20250316230305
Implantable Pulse Generator Implant Date: 20210129

## 2024-01-13 ENCOUNTER — Telehealth: Payer: Self-pay

## 2024-01-13 NOTE — Telephone Encounter (Signed)
 ILR summary report received. Battery status OK. Normal device function. No new symptom, tachy, brady, or pause episodes.   2 new AF episodes, longest 1 hr 18 min, V rates > 110 bpm ~ 35%, burden 0.5%, on Eliquis per Epic. Cannot exclude ongoing AF on presenting EGM due to noisy baseline.     Patient is due for routine follow up with AF clinic: R. Fenton PA in the next month.  Will forward to his team for FYI and follow up.

## 2024-01-18 ENCOUNTER — Ambulatory Visit: Payer: Medicare Other

## 2024-01-18 NOTE — Progress Notes (Signed)
 [facility] Summary Report / Loop Recorder

## 2024-02-15 ENCOUNTER — Ambulatory Visit (INDEPENDENT_AMBULATORY_CARE_PROVIDER_SITE_OTHER): Payer: Medicare Other

## 2024-02-15 DIAGNOSIS — G459 Transient cerebral ischemic attack, unspecified: Secondary | ICD-10-CM

## 2024-02-15 LAB — CUP PACEART REMOTE DEVICE CHECK
Date Time Interrogation Session: 20250420230048
Implantable Pulse Generator Implant Date: 20210129

## 2024-02-22 ENCOUNTER — Ambulatory Visit: Payer: Medicare Other

## 2024-02-23 ENCOUNTER — Encounter (HOSPITAL_COMMUNITY): Payer: Self-pay | Admitting: Physician Assistant

## 2024-02-23 ENCOUNTER — Ambulatory Visit (HOSPITAL_COMMUNITY)
Admission: RE | Admit: 2024-02-23 | Discharge: 2024-02-23 | Disposition: A | Discharge status: Home / Self Care | Source: Ambulatory Visit | Attending: Physician Assistant | Admitting: Physician Assistant

## 2024-02-23 VITALS — BP 138/82 | HR 74 | Ht 64.0 in | Wt 167.0 lb

## 2024-02-23 DIAGNOSIS — D6869 Other thrombophilia: Secondary | ICD-10-CM

## 2024-02-23 DIAGNOSIS — I480 Paroxysmal atrial fibrillation: Secondary | ICD-10-CM

## 2024-02-23 NOTE — Progress Notes (Signed)
 Primary Care Physician: Berta Brittle, MD (Inactive) Primary Electrophysiologist: Dr Arlester Ladd  Referring Physician: Dr Allred/device clinic   Courtney Shea is a 89 y.o. female with a history of breast cancer, HTN, hypothyroidism, TIA, atrial fibrillation who presents for follow up in the [facility]. She was hospitalized 10/2019 with an acute TIA and a ILR was placed by Dr Nunzio Belch at that time. The device clinic received an alert for two afib episodes on 11/03/20. Patient was unaware of any arrhythmia at that time. Patient is on Eliquis  for stroke prevention.   Patient returns for follow up for atrial fibrillation. Her ILR shows 0.2% afib burden with a 6.5 hour episode occurring on 3/23. She was unaware of her arrhythmia. There were no specific triggers that she could identify. She denies any bleeding issues on anticoagulation.   Today, she  denies symptoms of palpitations, chest pain, shortness of breath, orthopnea, PND, lower extremity edema, dizziness, presyncope, syncope, snoring, daytime somnolence, bleeding, or neurologic sequela. The patient is tolerating medications without difficulties and is otherwise without complaint today.    Atrial Fibrillation Risk Factors:  she does not have symptoms or diagnosis of sleep apnea. she does not have a history of rheumatic fever. she does not have a history of alcohol use. The patient does not have a history of early familial atrial fibrillation or other arrhythmias.   Atrial Fibrillation Management history:  Previous antiarrhythmic drugs: none Previous cardioversions: none Previous ablations: none Anticoagulation history: Eliquis    Past Medical History:  Diagnosis Date   Breast cancer (HCC)    Breast cancer of lower-outer quadrant of right female breast (HCC) 05/07/2015   GERD (gastroesophageal reflux disease)    Hypertension    Hypothyroid    Personal history of radiation therapy    Stroke (HCC)    TIA,  no deficits, placed on Plavix    Syncope    Ventricular bigeminy     Current Outpatient Medications  Medication Sig Dispense Refill   acetaminophen  (TYLENOL ) 325 MG tablet Take 2 tablets (650 mg total) by mouth every 6 (six) hours as needed for mild pain (or Fever >/= 101).     amLODipine  (NORVASC ) 2.5 MG tablet Take 2.5 mg by mouth every morning.     apixaban  (ELIQUIS ) 5 MG TABS tablet Take 1 tablet (5 mg total) by mouth 2 (two) times daily. 180 tablet 3   atorvastatin  (LIPITOR) 20 MG tablet Take 1 tablet (20 mg total) by mouth daily at 6 PM. 30 tablet 0   Calcium  Carb-Cholecalciferol (CALCIUM +D3 PO) Take 1 tablet by mouth daily with lunch.     cetirizine (ZYRTEC) 10 MG tablet Take 10 mg by mouth daily.     clobetasol  (TEMOVATE ) 0.05 % external solution Apply topically.     cloNIDine  (CATAPRES ) 0.3 MG tablet Take 0.3 mg by mouth at bedtime.     Cranberry 500 MG CAPS Take 500 mg by mouth daily.     furosemide  (LASIX ) 20 MG tablet Take 20 mg by mouth daily as needed.     levothyroxine  (SYNTHROID ) 100 MCG tablet Take 100 mcg by mouth daily.      losartan  (COZAAR ) 100 MG tablet Take 100 mg by mouth daily.     metoprolol  succinate (TOPROL -XL) 50 MG 24 hr tablet Take 50 mg by mouth daily. Take with or immediately following a meal.     pantoprazole  (PROTONIX ) 40 MG tablet Take 40 mg by mouth every evening.     sertraline (ZOLOFT) 25  MG tablet Take 25 mg by mouth every morning.     No current facility-administered medications for this encounter.    ROS- All systems are reviewed and negative except as per the HPI above.  Physical Exam: Vitals:   02/23/24 1339  BP: 138/82  Pulse: 74  Weight: 75.8 kg  Height: 5\' 4"  (1.626 m)     GEN: Well nourished, well developed in no acute distress CARDIAC: Regular rate and rhythm, no murmurs, rubs, gallops RESPIRATORY:  Clear to auscultation without rales, wheezing or rhonchi  ABDOMEN: Soft, non-tender, non-distended EXTREMITIES:  non pitting pedal  edema, No deformity    Wt Readings from Last 3 Encounters:  02/23/24 75.8 kg  02/18/23 78 kg  10/08/21 77.1 kg    EKG today demonstrates  SR with ectopic atrial beats Vent. rate 74 BPM PR interval * ms QRS duration 78 ms QT/QTcB 356/395 ms   Echo 11/24/19 demonstrated  1. Left ventricular ejection fraction, by visual estimation, is 55 to  60%. The left ventricle has normal function. Left ventricular septal wall  thickness was moderately increased. Moderately increased left ventricular  posterior wall thickness. There is moderately increased left ventricular hypertrophy.   2. Definity  contrast agent was given IV to delineate the left ventricular  endocardial borders.   3. Left ventricular diastolic parameters are consistent with Grade I  diastolic dysfunction (impaired relaxation).   4. The left ventricle has no regional wall motion abnormalities.   5. Global right ventricle has normal systolic function.The right  ventricular size is normal. No increase in right ventricular wall  thickness.   6. Left atrial size was normal.   7. Right atrial size was normal.   8. The mitral valve is normal in structure. Trivial mitral valve  regurgitation. No evidence of mitral stenosis.   9. The tricuspid valve is normal in structure.  10. The tricuspid valve is normal in structure. Tricuspid valve  regurgitation is mild.  11. The aortic valve is tricuspid. Aortic valve regurgitation is not  visualized. No evidence of aortic valve sclerosis or stenosis.  12. The pulmonic valve was normal in structure. Pulmonic valve  regurgitation is not visualized.  13. Normal pulmonary artery systolic pressure.  14. The inferior vena cava is normal in size with greater than 50%  respiratory variability, suggesting right atrial pressure of 3 mmHg.   Epic records are reviewed at length today  CHA2DS2-VASc Score = 6  The patient's score is based upon: CHF History: 0 HTN History: 1 Diabetes History:  0 Stroke History: 2 Vascular Disease History: 0 Age Score: 2 Gender Score: 1       ASSESSMENT AND PLAN: Paroxysmal Atrial Fibrillation (ICD10:  I48.0) The patient's CHA2DS2-VASc score is 6, indicating a 9.7% annual risk of stroke.   Patient in SR today ILR shows a 0.2% afib burden. Would treat conservatively given her advanced age and paucity of symptoms.  Continue Eliquis  5 mg BID Continue Toprol  50 mg daily  Secondary Hypercoagulable State (ICD10:  D68.69) The patient is at significant risk for stroke/thromboembolism based upon her CHA2DS2-VASc Score of 6.  Continue Apixaban  (Eliquis ). No bleeding issues. Check bmet/cbc today.   HTN Stable on current regimen   Follow up in the AF clinic in one year.    Myrtha Ates PA-C Afib Clinic [facility] [address] [place], [place] [address] [phone] 02/23/2024 1:55 PM

## 2024-02-24 LAB — CBC
Hematocrit: 38.8 % (ref 34.0–46.6)
Hemoglobin: 12.8 g/dL (ref 11.1–15.9)
MCH: 28.6 pg (ref 26.6–33.0)
MCHC: 33 g/dL (ref 31.5–35.7)
MCV: 87 fL (ref 79–97)
Platelets: 290 10*3/uL (ref 150–450)
RBC: 4.47 x10E6/uL (ref 3.77–5.28)
RDW: 12.8 % (ref 11.7–15.4)
WBC: 10 10*3/uL (ref 3.4–10.8)

## 2024-02-24 LAB — BASIC METABOLIC PANEL WITH GFR
BUN/Creatinine Ratio: 20 (ref 12–28)
BUN: 20 mg/dL (ref 10–36)
CO2: 21 mmol/L (ref 20–29)
Calcium: 10 mg/dL (ref 8.7–10.3)
Chloride: 101 mmol/L (ref 96–106)
Creatinine, Ser: 1.01 mg/dL — ABNORMAL HIGH (ref 0.57–1.00)
Glucose: 99 mg/dL (ref 70–99)
Potassium: 4.3 mmol/L (ref 3.5–5.2)
Sodium: 141 mmol/L (ref 134–144)
eGFR: 53 mL/min/{1.73_m2} — ABNORMAL LOW (ref >59)

## 2024-03-02 NOTE — Progress Notes (Signed)
 [facility] Summary Report / Loop Recorder

## 2024-03-02 NOTE — Addendum Note (Signed)
 Addended by: Edra Govern D on: 03/02/2024 12:24 PM   Modules accepted: Orders

## 2024-03-22 ENCOUNTER — Ambulatory Visit: Payer: Medicare Other

## 2024-03-22 DIAGNOSIS — I480 Paroxysmal atrial fibrillation: Secondary | ICD-10-CM

## 2024-03-23 LAB — CUP PACEART REMOTE DEVICE CHECK
Date Time Interrogation Session: 20250526230119
Implantable Pulse Generator Implant Date: 20210129

## 2024-03-28 ENCOUNTER — Ambulatory Visit: Payer: Medicare Other

## 2024-03-30 ENCOUNTER — Ambulatory Visit: Payer: Self-pay | Admitting: Cardiovascular Disease

## 2024-04-06 NOTE — Progress Notes (Signed)
 [facility] Summary Report / Loop Recorder

## 2024-04-17 ENCOUNTER — Other Ambulatory Visit (HOSPITAL_COMMUNITY): Payer: Self-pay | Admitting: Physician Assistant

## 2024-04-21 ENCOUNTER — Ambulatory Visit

## 2024-04-21 DIAGNOSIS — I480 Paroxysmal atrial fibrillation: Secondary | ICD-10-CM

## 2024-04-21 LAB — CUP PACEART REMOTE DEVICE CHECK
Date Time Interrogation Session: 20250625230604
Implantable Pulse Generator Implant Date: 20210129

## 2024-04-25 ENCOUNTER — Ambulatory Visit: Payer: Medicare Other

## 2024-04-27 ENCOUNTER — Ambulatory Visit: Payer: Self-pay | Admitting: Cardiovascular Disease

## 2024-05-02 ENCOUNTER — Ambulatory Visit: Payer: Medicare Other

## 2024-05-13 NOTE — Progress Notes (Signed)
 [facility] Summary Report / Loop Recorder

## 2024-05-13 NOTE — Addendum Note (Signed)
 Addended by: TAWNI DRILLING D on: 05/13/2024 02:59 PM   Modules accepted: Orders

## 2024-05-23 ENCOUNTER — Ambulatory Visit (INDEPENDENT_AMBULATORY_CARE_PROVIDER_SITE_OTHER)

## 2024-05-23 DIAGNOSIS — I480 Paroxysmal atrial fibrillation: Secondary | ICD-10-CM | POA: Diagnosis not present

## 2024-05-23 LAB — CUP PACEART REMOTE DEVICE CHECK
Date Time Interrogation Session: 20250727230600
Implantable Pulse Generator Implant Date: 20210129

## 2024-05-30 ENCOUNTER — Ambulatory Visit: Payer: Medicare Other

## 2024-06-06 ENCOUNTER — Ambulatory Visit: Payer: Medicare Other

## 2024-06-06 ENCOUNTER — Ambulatory Visit: Payer: Self-pay | Admitting: Cardiovascular Disease

## 2024-06-23 ENCOUNTER — Ambulatory Visit

## 2024-06-23 DIAGNOSIS — I480 Paroxysmal atrial fibrillation: Secondary | ICD-10-CM

## 2024-06-23 LAB — CUP PACEART REMOTE DEVICE CHECK
Date Time Interrogation Session: 20250827230621
Implantable Pulse Generator Implant Date: 20210129

## 2024-06-30 ENCOUNTER — Ambulatory Visit: Payer: Self-pay | Admitting: Cardiovascular Disease

## 2024-07-04 ENCOUNTER — Ambulatory Visit: Payer: Medicare Other

## 2024-07-07 NOTE — Progress Notes (Signed)
 Remote Loop Recorder Transmission

## 2024-07-07 NOTE — Progress Notes (Signed)
 [facility] Summary Report / Loop Recorder

## 2024-07-25 ENCOUNTER — Ambulatory Visit (INDEPENDENT_AMBULATORY_CARE_PROVIDER_SITE_OTHER)

## 2024-07-25 DIAGNOSIS — I480 Paroxysmal atrial fibrillation: Secondary | ICD-10-CM | POA: Diagnosis not present

## 2024-07-25 LAB — CUP PACEART REMOTE DEVICE CHECK
Date Time Interrogation Session: 20250928230518
Implantable Pulse Generator Implant Date: 20210129

## 2024-07-27 NOTE — Progress Notes (Signed)
 Remote Loop Recorder Transmission

## 2024-07-28 NOTE — Progress Notes (Signed)
 Remote Loop Recorder Transmission

## 2024-08-02 ENCOUNTER — Ambulatory Visit: Payer: Self-pay | Admitting: Cardiovascular Disease

## 2024-08-08 ENCOUNTER — Ambulatory Visit: Payer: Medicare Other

## 2024-08-25 ENCOUNTER — Ambulatory Visit (INDEPENDENT_AMBULATORY_CARE_PROVIDER_SITE_OTHER)

## 2024-08-25 DIAGNOSIS — I480 Paroxysmal atrial fibrillation: Secondary | ICD-10-CM

## 2024-08-25 LAB — CUP PACEART REMOTE DEVICE CHECK
Date Time Interrogation Session: 20251029230353
Implantable Pulse Generator Implant Date: 20210129

## 2024-08-25 NOTE — Progress Notes (Signed)
 Remote Loop Recorder Transmission

## 2024-09-01 ENCOUNTER — Ambulatory Visit: Payer: Self-pay | Admitting: Cardiovascular Disease

## 2024-09-12 ENCOUNTER — Ambulatory Visit: Payer: Medicare Other

## 2024-09-25 ENCOUNTER — Ambulatory Visit

## 2024-09-28 ENCOUNTER — Ambulatory Visit: Attending: Cardiovascular Disease

## 2024-09-28 DIAGNOSIS — I480 Paroxysmal atrial fibrillation: Secondary | ICD-10-CM

## 2024-09-29 ENCOUNTER — Ambulatory Visit: Payer: Self-pay | Admitting: Cardiovascular Disease

## 2024-09-29 LAB — CUP PACEART REMOTE DEVICE CHECK
Date Time Interrogation Session: 20251202230058
Implantable Pulse Generator Implant Date: 20210129

## 2024-10-04 NOTE — Progress Notes (Signed)
 Remote Loop Recorder Transmission

## 2024-10-26 ENCOUNTER — Ambulatory Visit

## 2024-10-29 ENCOUNTER — Ambulatory Visit: Attending: Cardiovascular Disease

## 2024-10-29 DIAGNOSIS — I480 Paroxysmal atrial fibrillation: Secondary | ICD-10-CM | POA: Diagnosis not present

## 2024-10-31 LAB — CUP PACEART REMOTE DEVICE CHECK
Date Time Interrogation Session: 20260102230101
Implantable Pulse Generator Implant Date: 20210129

## 2024-11-03 NOTE — Progress Notes (Signed)
 Remote Loop Recorder Transmission

## 2024-11-05 ENCOUNTER — Ambulatory Visit: Payer: Self-pay | Admitting: Cardiovascular Disease

## 2024-11-26 ENCOUNTER — Ambulatory Visit

## 2024-11-29 ENCOUNTER — Ambulatory Visit

## 2024-11-29 LAB — CUP PACEART REMOTE DEVICE CHECK
Date Time Interrogation Session: 20260202230420
Implantable Pulse Generator Implant Date: 20210129

## 2024-12-30 ENCOUNTER — Ambulatory Visit

## 2025-01-30 ENCOUNTER — Ambulatory Visit

## 2025-03-02 ENCOUNTER — Ambulatory Visit

## 2025-04-02 ENCOUNTER — Ambulatory Visit

## 2025-05-03 ENCOUNTER — Ambulatory Visit

## 2025-06-03 ENCOUNTER — Ambulatory Visit

## 2025-07-04 ENCOUNTER — Ambulatory Visit

## 2025-08-04 ENCOUNTER — Ambulatory Visit

## 2025-09-04 ENCOUNTER — Ambulatory Visit

## 2025-10-05 ENCOUNTER — Ambulatory Visit

## 2025-11-05 ENCOUNTER — Ambulatory Visit
